# Patient Record
Sex: Female | Born: 1950 | Race: White | Marital: Married | Smoking: Never smoker
Health system: Northeastern US, Academic
[De-identification: ages and names within clinical notes are randomized; demographics above are authoritative.]

## PROBLEM LIST (undated history)

## (undated) DIAGNOSIS — E785 Hyperlipidemia, unspecified: Secondary | ICD-10-CM

## (undated) DIAGNOSIS — D0512 Intraductal carcinoma in situ of left breast: Secondary | ICD-10-CM

## (undated) DIAGNOSIS — C801 Malignant (primary) neoplasm, unspecified: Secondary | ICD-10-CM

## (undated) DIAGNOSIS — F32A Depression, unspecified: Secondary | ICD-10-CM

## (undated) DIAGNOSIS — I341 Nonrheumatic mitral (valve) prolapse: Secondary | ICD-10-CM

## (undated) DIAGNOSIS — F419 Anxiety disorder, unspecified: Secondary | ICD-10-CM

## (undated) DIAGNOSIS — M1990 Unspecified osteoarthritis, unspecified site: Secondary | ICD-10-CM

## (undated) DIAGNOSIS — E039 Hypothyroidism, unspecified: Secondary | ICD-10-CM

## (undated) DIAGNOSIS — K219 Gastro-esophageal reflux disease without esophagitis: Secondary | ICD-10-CM

## (undated) DIAGNOSIS — Z9109 Other allergy status, other than to drugs and biological substances: Secondary | ICD-10-CM

## (undated) HISTORY — DX: Anxiety disorder, unspecified: F41.9

## (undated) HISTORY — DX: Other allergy status, other than to drugs and biological substances: Z91.09

## (undated) HISTORY — PX: KNEE SURGERY: SHX244

## (undated) HISTORY — DX: Malignant (primary) neoplasm, unspecified: C80.1

## (undated) HISTORY — DX: Gastro-esophageal reflux disease without esophagitis: K21.9

## (undated) HISTORY — DX: Hyperlipidemia, unspecified: E78.5

## (undated) HISTORY — DX: Intraductal carcinoma in situ of left breast: D05.12

## (undated) HISTORY — DX: Unspecified osteoarthritis, unspecified site: M19.90

## (undated) HISTORY — DX: Hypothyroidism, unspecified: E03.9

## (undated) HISTORY — DX: Nonrheumatic mitral (valve) prolapse: I34.1

## (undated) HISTORY — PX: TONSILLECTOMY: SHX28A

## (undated) HISTORY — DX: Depression, unspecified: F32.A

---

## 2009-03-29 ENCOUNTER — Ambulatory Visit
Admit: 2009-03-29 | Discharge: 2009-03-29 | Disposition: A | Discharge status: Home / Self Care | Payer: Self-pay | Source: Ambulatory Visit | Attending: Internal Medicine | Admitting: Internal Medicine

## 2009-03-29 LAB — TSH: TSH: 2.34 u[IU]/mL (ref 0.27–4.20)

## 2009-03-31 LAB — VITAMIN D
25-OH VIT D2: <4 ng/mL
25-OH VIT D3: 32 ng/mL
25-OH Vit Total: 32 ng/mL (ref 30–80)

## 2010-03-01 ENCOUNTER — Ambulatory Visit
Admit: 2010-03-01 | Discharge: 2010-03-01 | Disposition: A | Discharge status: Home / Self Care | Payer: Self-pay | Source: Ambulatory Visit | Attending: Internal Medicine | Admitting: Internal Medicine

## 2010-03-01 LAB — LIPID PANEL
Chol/HDL Ratio: 2.3
Cholesterol: 207 mg/dL — AB
HDL: 91 mg/dL
LDL Calculated: 103 mg/dL
Non HDL Cholesterol: 116 mg/dL
Triglycerides: 66 mg/dL

## 2010-03-01 LAB — GLUCOSE: Glucose: 105 mg/dL (ref 74–106)

## 2010-03-01 LAB — TSH: TSH: 0.58 u[IU]/mL (ref 0.27–4.20)

## 2010-03-03 LAB — VITAMIN D
25-OH VIT D2: <4 ng/mL
25-OH VIT D3: 17 ng/mL
25-OH Vit Total: 17 ng/mL — ABNORMAL LOW (ref 30–80)

## 2011-10-02 ENCOUNTER — Ambulatory Visit: Payer: Self-pay | Admitting: Orthopedic Surgery

## 2011-10-02 ENCOUNTER — Encounter: Payer: Self-pay | Admitting: Orthopedic Surgery

## 2011-10-02 VITALS — BP 124/77 | Ht 62.0 in | Wt 164.0 lb

## 2011-10-02 DIAGNOSIS — R52 Pain, unspecified: Secondary | ICD-10-CM

## 2011-10-02 DIAGNOSIS — S83249A Other tear of medial meniscus, current injury, unspecified knee, initial encounter: Secondary | ICD-10-CM | POA: Insufficient documentation

## 2011-10-02 NOTE — Patient Instructions (Signed)
Dear [name],    Your physician has determined that you require durable medical equipment (DME) as a part of your treatment.  Knee braces, cast boots, walking boots, crutches, etc. Are DME.  These items offer protection and provide for your safety.  The type and quality of DME has been prescribed for you by your provider.      We cannot determine how much of the cost of this product will be paid by your insurance carrier.  Therefore, you may receive a bill for the outstanding balance.  It is your responsibility to pay whatever fee your insurance carrier does not.    DME Return Policy:     Braces and boots are not returnable if work outside of clinic due to hygiene concerns.   Poorly fitting braces can be exchanged for a correct fit within 1 week if the DME is in excellent condition.   DME that was not dispensed by [facility] will not be accepted.    If DME must be returned, it must be returned to the office that dispensed it.   Special order braces are billed at the time of order and are non-refundable.   Brace parts can be ordered and replaced if they become damaged or worn out.  This may include a charge.    Your type of brace: Other - XL hinged knee brace left    Patient Signature: __________________________  10/02/2011

## 2011-10-02 NOTE — Progress Notes (Signed)
Dictated

## 2011-10-03 NOTE — Progress Notes (Signed)
PATIENTKHAMARIA, [name]  MR #:  [id number]   ACCOUNT #:  [id number] DOB:  [birth year]   DICTATED BY:  Frances Nickels, PA DATE OF VISIT:  10/02/2011     CHIEF COMPLAINT:  Left knee pain.    HISTORY OF PRESENT ILLNESS:  The patient reports that she has been having pain for the better part of a year now.  She had left knee pain following an all-day gardening session and the pain was extreme.  It lasted for a couple of days and then subsided to some degree and she saw her primary care physician some time in October.  No x-rays were done and a diagnosis of bursitis was given.  She was placed on naproxen, which did not help.  She called back to the primary's office and asked for an x-ray. The x-ray was obtained and reveals mild medial joint line narrowing.  Mild patellofemoral narrowing as well. She says that the pain has persisted and it is getting increasingly difficult to walk.  She is unable to work out very effectively.  She says there is a lot of throbbing and aching when she is laying in bed at night. Daily activities very limited.  She is currently taking naproxen.  She had x-rays at [facility] as previously noted.    PAST MEDICAL HISTORY:  Significant for mitral valve prolapse since 1983.  Mild colitis, thyroid problem, occasional depression.  History of her wrist fracture as a child.  Past surgeries tonsillectomy.    ALLERGIES:  None.    SOCIAL HISTORY:  She is married.  Nonsmoker.  Drinks alcohol.  Denies recreational drug use.    FAMILY HISTORY:  Mother deceased Alzheimer's.  Father deceased, lung cancer.  Nine siblings, 1 deceased from emphysema.  Three children in good health.    REVIEW OF SYSTEMS:  Colitis and depression due to pain and increased weight.    PHYSICAL EXAM:  Exam today reveals a pleasant and alert 61 year old female in no acute distress.  Exam of her left knee reveals a small effusion, no warmth or redness.  No breaks in the skin.  Range of motion 0-120 with pain at the end of flexion,  nontender laterally.  Significantly tender along the medial joint line.  Flexion and rotational stress is very painful medially.  Negative anterior and posterior drawer and Lachman.  No increase in discomfort or laxity with varus stress but valgus stress aggravates the medial side of her knee considerably.  There is no appreciable laxity.    ASSESSMENT:  Given the duration of symptoms and the exam today, I think she has a medial meniscus tear.  Accordingly, we will send her for an MRI, to get that scheduled soon and she will follow up with Dr. Janyth Contes when that is available to determine further treatment.  She is in so much discomfort that I did offer a corticosteroid injection.  She is willing to proceed, and under sterile precautions, I injected the left knee with 80 mg Depo-Medrol, 6 cc 1% Xylocaine.  She tolerated the procedure well, was given post-injection instructions.  She will return to the office following the MRI.       Dictated By:  Frances Nickels, PA      ______________________________  [name], MD    LJS/MODL  DD:  10/02/2011 15:57:25  DT:  10/03/2011 07:37:30  Job #:  [id number]    cc: Grant Fontana, MD   [address]  [place], [place] [address]

## 2011-10-09 ENCOUNTER — Ambulatory Visit: Payer: Self-pay | Admitting: Sports Medicine

## 2011-10-09 ENCOUNTER — Encounter: Payer: Self-pay | Admitting: Sports Medicine

## 2011-10-09 VITALS — BP 120/72 | Ht 62.0 in | Wt 164.0 lb

## 2011-10-09 DIAGNOSIS — S83249A Other tear of medial meniscus, current injury, unspecified knee, initial encounter: Secondary | ICD-10-CM

## 2011-10-09 NOTE — Progress Notes (Signed)
Dictated

## 2011-10-10 NOTE — Progress Notes (Signed)
PATIENTSONDREA, BREN  MR #:  [id number]   ACCOUNT #:  [id number] DOB:  01-13-1951   DICTATED BY:  [name], MD DATE OF VISIT:  10/09/2011     Ahliyah returns for reexamination of left knee and review of MRI findings.  Persistent medial based knee pain.  Anti-inflammatories are not helpful.  Pain persistent primarily with difficulty in walking and sleeping at night as well as activities of daily living, particularly with pivoting.       Past medical history, medications and allergies are as documented in the office chart and marked as reviewed.    SOCIAL HISTORY:  She is married.  Nonsmoker.  Drinks alcohol occasionally. 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    PHYSICAL EXAMINATION:  Focused exam of the left knee demonstrates pain on range of motion including 0-120 with a medial based knee pain.  Pain is worse on deep flexion and circumduction.    IMAGING:  MRI demonstrates complex tear, medial meniscus. Moderate patellofemoral arthritis.    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  Her pain is medial based and this is the location of her meniscus tear.  Although she does have arthritic changes, her pain is not associated with patellofemoral chondromalacia on exam today.  I believe at this point she has demonstrated failure of conservative management.  We have discussed the option of arthroscopic partial meniscectomy and chondroplasty and the expected time course for recovery.  She is interested in pursuing and agrees.  She has failed conservative management. The plan is to have her phone the office to schedule and I will see her back at the time of surgery.             ______________________________  [name], MD    JPG/MODL  DD:  10/09/2011 18:44:39  DT:  10/10/2011 01:48:31  Job #:  [id number]    cc: Grant Fontana, MD   [address]   [place], [place] [address]

## 2011-10-18 ENCOUNTER — Encounter: Payer: Self-pay | Admitting: Sports Medicine

## 2011-10-18 NOTE — OR PreOp (Signed)
[facility] Pre-operative Guidelines    Day of surgery arrival Time:  The surgery center will call you the business day before your procedure between 3 and 4:30pm.     Eating and drinking guidelines:  No solid foods after midnight the night before your procedure.  Adults-  May have clear liquids (water, soda or apple juice only) up to 4 hours prior to your arrival time and then nothing by mouth.  Pediatrics-  All but dental patients may have clear liquids (water, soda, or apple juice only) up to 3 hours before arrival time.   Dental patients are NPO after midnight.  Breast fed infants may have breast milk up to 4 hours before arrival.  Formula fed infants may have formula up to 6 hours before arrival.    Medications:  Medications to be taken on the day of surgery:   synthroid    Items to bring on the day of surgery:  Insurance card, photo ID and healthcare proxy.  If you use a CPAP for sleep apnea, please bring your mask with you.  Your crutches, knee brace or arm sling. Please leave your crutches in the car.    Clothing, Jewelry, valuables and eyeglass case:  Wear loose fitting clothing that will fit over a bulky dressing.  Leave all jewelry and valuables at home.  Bring your eyeglasses and eyeglass case. You cannot wear contact lenses.     Pediatrics:  A parent or legal guardian must accompany and remain in the building at all times. We recommend that two adults accompany the patient home while riding in a car; one to drive the vehicle and one to assist with the care of the child.   Bring the child’s favorite comfort item (i.e. blanket, doll or toy).    Surgeon Instructions:  Please read all pre-operative instructions carefully, and follow your surgeons guidelines if different from these instructions.

## 2011-11-02 NOTE — Anesthesia Pre-procedure Eval (Signed)
Anesthesia Pre-operative Evaluation for [name]    61 yo female for left knee arthroscopy     Health History  Past Medical History   Diagnosis Date   . Mitral valve prolapse    . Thyroid disease      Past Surgical History   Procedure Laterality Date   . Tonsillectomy       Social History  History   Substance Use Topics   . Smoking status: Former Games developer   . Smokeless tobacco: Not on file   . Alcohol Use: 4.2 oz/week     7 Glasses of wine per week      History   Drug Use No     ______________________________________________________________________  Allergies: No Known Allergies (drug, envir, food or latex)  Prior to Admission Medications              Last Dose Start Date End Date Provider     levothyroxine (SYNTHROID, LEVOTHROID) 100 MCG tablet 11/05/2011 at 0730  --  --  [provider]     naproxen (NAPROSYN) 500 MG tablet Past Month at Unknown  --  --  [provider]        Current Outpatient Prescriptions   Medication Sig   . levothyroxine (SYNTHROID, LEVOTHROID) 100 MCG tablet Take 100 mcg by mouth daily (before breakfast)   . naproxen (NAPROSYN) 500 MG tablet Take 500 mg by mouth 2 times daily (with meals)     Current Facility-Administered Medications   Medication   . lidocaine 1 % injection 0.1 mL   . Lactated Ringers Infusion   . ceFAZolin (ANCEF) injection 1,000 mg     Admission Medications:  Scheduled Meds       . cefazolin IV     IV Meds   PRN Meds       . lidocaine 0.1 mL at 11/05/11 1032     Anesthesia EvaluationInformation Source: per records, per patient, per family  General     Denies general issues    + Obesity  Pertinent (-):  history of anesthetic complications or FamHx of anesthetic complications    HEENT  Denies HEENT issues    + Corrective Eyewear            glasses    Pulmonary   Denies pulmonary issues    + Smoker            former  Pertinent (-):  asthma, recent URI or sleep apnea Cardiovascular  Denies cardiovascular issues  Good(4+METs) Exercise Tolerance  Pertinent  (-):  hypertension or DOE    GI/Hepatic/Renal     Denies GI/hepatic/renal issues  Last PO Intake: >8hr before procedure      + Alcohol use  Pertinent (-):  GERD Neuro/Psych    Denies neuro/psych issues    Endo/Other    Denies endo issues    +Thyroid Disease          HYPOthyroid    Pertinent (-):  diabetes mellitus    Hematologic  Denies hematologic issues     Nursing Reported PO Status: Date Last PO Fluids: 11/04/11 2300  Date Last PO Solids: 11/04/11 2300  ______________________________________________________________________  Physical Exam    Airway            Mouth opening: normal            TM distance (fb): >3 FB            Neck ROM: full  Dental   Normal Exam  Cardiovascular  Normal Exam           Rhythm: regular           Rate: normal  No murmur     Pulmonary   pulmonary exam normal    breath sounds clear to auscultation    Mental Status   Normal  evaluation    oriented to person, place and time         Most Recent Vitals: BP: 124/81 mmHg (11/05/11 1023)  Heart Rate: 81 (11/05/11 1023)  Temp: 36.4 C (97.5 F) (11/05/11 1023)  Resp: 18 (11/05/11 1023)  Height: 157.5 cm (5\' 2" ) (10/18/11 1130)  weight: 75.751 kg (167 lb) (11/05/11 1023)  BMI (Calculated): 30.6 (11/05/11 1023)  SpO2: 98 % (11/05/11 1023)    Vital Sign Ranges (last 24hrs)  Temp:  [36.4 C (97.5 F)] 36.4 C (97.5 F)  Heart Rate:  [81] 81  Resp:  [18] 18  BP: (124)/(81) 124/81 mmHg   O2 Device: None (Room air) (11/05/11 1023)    Most Recent Lab Results   Blood Type  No results found for this basename: aborh,  abs   CBC  No results found for this basename: WBC,  WBCM,  HCT,  PLT   Chem-7  Lab Results   Component Value Date    GLU 105 03/01/2010   CrCl is unknown because no creatinine reading has been taken.  Electrolytes  No results found for this basename: CA,  MG,  PO4   Coags  No results found for this basename: PTI,  INR,  PTT   LFTs  No results found for this basename: AST,  ALT,  ALK      Pregnancy Test:   No results found for this  basename: PUPT,  UPREG,  SPREG,  HCG1,  BHCG2,  BHCG,  HCGB     UPT:  No results found for this basename: PUPT, UPREG, SPREG, HCG1       LMP:  No LMP recorded. Patient is postmenopausal.    ECG Results  No results found for this basename: rate,  PR,  statement     ANES CPM    Radiology: No relevant studies     ________________________________________________________________________  Medical Problems  Patient Active Problem List    Diagnosis Date Noted   . Medial meniscus tear 10/02/2011       PreOp/PreProcedure Diagnosis (For more detail see procedural consent)            Left knee pain  Planned Procedure (For more detail see procedural consent)            Left knee arthroscopy  Plan   ASA Score 2  Anesthetic Plan (general); Induction (routine IV); Airway (LMA); Line ( use current access); Monitoring (standard ASA); Positioning (supine); Pain (per surgical team and intraop local); PostOp (PACU)    Informed Consent     Risks:          Risks discussed were commensurate with the plan listed above with the following specific points:  N/V, sore throat , damage to:(eyes, nerves, teeth), allergic Rx, unexpected serious injury, death    Anesthetic Consent:         Anesthetic plan and risks discussed with:  patient and spouse    Plan discussed with:  resident      Attending Attestation: The patient or proxy understand and accept the risks and benefits of the anesthesia plan. By accepting this note, I attest that I have personally performed the  history and physical exam and prescribed the anesthetic plan within 48 hours prior to the anesthetic as documented by me above.    Author: Briscoe Deutscher, MD

## 2011-11-05 ENCOUNTER — Ambulatory Visit
Admit: 2011-11-05 | Discharge: 2011-11-05 | Disposition: A | Discharge status: Home / Self Care | Payer: Self-pay | Source: Ambulatory Visit | Attending: Sports Medicine | Admitting: Sports Medicine

## 2011-11-05 MED ORDER — LIDOCAINE HCL 1 % IJ SOLN
0.1000 mL | Freq: Once | INTRAMUSCULAR | Status: DC | PRN
Start: 2011-11-05 — End: 2011-11-06
  Administered 2011-11-05: 0.1 mL via SUBCUTANEOUS

## 2011-11-05 MED ORDER — ONDANSETRON HCL 2 MG/ML IV SOLN *I*
1.0000 mg | Freq: Once | INTRAMUSCULAR | Status: AC | PRN
Start: 2011-11-05 — End: 2011-11-05

## 2011-11-05 MED ORDER — LIDOCAINE HCL 1 % IJ SOLN
INTRAMUSCULAR | Status: AC
Start: 2011-11-05 — End: 2011-11-05
  Filled 2011-11-05: qty 2

## 2011-11-05 MED ORDER — CEFAZOLIN SODIUM 1000 MG IJ SOLR *I*
1000.0000 mg | INTRAMUSCULAR | Status: DC
Start: 2011-11-05 — End: 2011-11-06

## 2011-11-05 MED ORDER — HYDROCODONE-ACETAMINOPHEN 5-325 MG PO TABS *I*
1.0000 | ORAL_TABLET | ORAL | Status: DC | PRN
Start: 2011-11-05 — End: 2017-02-15

## 2011-11-05 MED ORDER — PROMETHAZINE HCL 25 MG/ML IJ SOLN *I*
6.2500 mg | INTRAMUSCULAR | Status: DC | PRN
Start: 2011-11-05 — End: 2011-11-06

## 2011-11-05 MED ORDER — OXYCODONE-ACETAMINOPHEN 5-325 MG/5ML PO SOLN *A*
5.0000 mL | Freq: Once | ORAL | Status: DC | PRN
Start: 2011-11-05 — End: 2011-11-06
  Administered 2011-11-05: 5 mL via ORAL

## 2011-11-05 MED ORDER — OXYCODONE-ACETAMINOPHEN 5-325 MG/5ML PO SOLN *A*
ORAL | Status: AC
Start: 2011-11-05 — End: 2011-11-05
  Filled 2011-11-05: qty 5

## 2011-11-05 MED ORDER — CEFAZOLIN SODIUM 1000 MG IJ SOLR *I*
INTRAMUSCULAR | Status: AC
Start: 2011-11-05 — End: 2011-11-05
  Filled 2011-11-05: qty 10

## 2011-11-05 MED ORDER — ASPIRIN 325 MG PO TBEC
325.0000 mg | DELAYED_RELEASE_TABLET | Freq: Every day | ORAL | Status: DC
Start: 2011-11-05 — End: 2017-02-28

## 2011-11-05 MED ORDER — HALOPERIDOL LACTATE 5 MG/ML IJ SOLN *I*
1.0000 mg | Freq: Once | INTRAMUSCULAR | Status: AC | PRN
Start: 2011-11-05 — End: 2011-11-05

## 2011-11-05 MED ORDER — HYDROMORPHONE HCL PF 1 MG/ML IJ SOLN *WRAPPED*
0.5000 mg | INTRAMUSCULAR | Status: DC | PRN
Start: 2011-11-05 — End: 2011-11-06

## 2011-11-05 MED ORDER — FENTANYL CITRATE 50 MCG/ML IJ SOLN *WRAPPED*
INTRAMUSCULAR | Status: AC
Start: 2011-11-05 — End: 2011-11-05
  Filled 2011-11-05: qty 2

## 2011-11-05 MED ORDER — MIDAZOLAM HCL 1 MG/ML IJ SOLN *I* WRAPPED
INTRAMUSCULAR | Status: AC
Start: 2011-11-05 — End: 2011-11-05
  Filled 2011-11-05: qty 2

## 2011-11-05 MED ORDER — LACTATED RINGERS IV SOLN *I*
20.0000 mL/h | INTRAVENOUS | Status: DC
Start: 2011-11-05 — End: 2011-11-06
  Administered 2011-11-05: 20 mL/h via INTRAVENOUS

## 2011-11-05 NOTE — Progress Notes (Signed)
Report given to [employer] [facility].

## 2011-11-05 NOTE — Discharge Instructions (Signed)
Due to the sedation medication and or general anesthesia you have been given today, please follow these discharge instructions:    [x]  Do not drive or operate any machinery for 24 hours or the specified time frame that was recommended by your doctor, please refer to post op instructions.  [x]  Do not drink any alcoholic beverages for 24 hours after your procedure and/or if you are taking a narcotic pain reliever (e.g. Vicodin, Percocet or Tylenol #3).  [x]  Do not make any major decisions or sign contracts for 24 hours.  [x]  Prescription information given to patient and/or patient representative.    Diet: begin with liquids, advance as tolerated.    Your last pain medication was given to you at: TORADOL (like ibubrofen) AT 1215 PM    Your next dose of pain medication is due after: IBUPROFEN AT 6:15 Pm, or NORCO as PRESCRIBED. Do not take tylenol while taking Norco.    If unable to reach your doctor at [phone], call 911 for a true emergency or go to your nearest emergency department.    It is our recommendation that you have someone stay with you for 24 hours following your procedure.

## 2011-11-05 NOTE — Preop H&P (Signed)
UPDATES TO PATIENT'S CONDITION on the DAY OF SURGERY/PROCEDURE    I. Updates to Patient's Condition (to be completed by a provider privileged to complete a H&P, following reassessment of the patient by the provider):    Day of Surgery/Procedure Update:  History  History reviewed and no change    Physical  Physical exam updated and no change.  Lungs clear and cor reg s1/s2              II. Procedure Readiness   I have reviewed the patient's H&P and updated condition. By completing and signing this form, I attest that this patient is ready for surgery/procedure.      III. Attestation   I have reviewed the updated information regarding the patient's condition and it is appropriate to proceed with the planned surgery/procedure.    [name], MD as of 10:16 AM 11/05/2011

## 2011-11-05 NOTE — Anesthesia Post-procedure Eval (Signed)
Anesthesia Post-op Note    Anesthesia Post-op Note    Patient: [name]    Procedure(s) Performed: s/p knee arthroscopy    Anesthesia type: General    Patient location: PACU    Mental Status: Recovered to baseline    Patient able to participate in this evaluation: yes    Last Vitals: BP 109/64  Pulse 68  Temp(Src) 36.1 C (97 F) (Temporal)  Resp 16  Ht 1.575 m (5\' 2" )  Wt 75.751 kg (167 lb)  BMI 30.54 kg/m2  SpO2 100%    Post-op vital signs: stable    Post-op vital signs noted above are within patient's normal range    Respiratory function: baseline    Airway patent: Yes    Cardiovascular and hydration status stable: Yes    Post-Op pain: Adequate analgesia    Post-Op assessment: no apparent anesthetic complications, tolerated procedure well and no evidence of recall    Complications: none    Attending Attestation: All indicated post anesthesia care provided    Author: Briscoe Deutscher, MD  as of: 11/05/2011  at: 1:49 PM

## 2011-11-06 MED FILL — Bupivacaine HCl Preservative Free (PF) Inj 0.25%: INTRAMUSCULAR | Qty: 30 | Status: AC

## 2011-11-06 MED FILL — Epinephrine HCl Inj 1 MG/ML: INTRAMUSCULAR | Qty: 1 | Status: AC

## 2011-11-06 MED FILL — Lidocaine Inj 1% w/ Epinephrine-1:100000: INTRAMUSCULAR | Qty: 50 | Status: AC

## 2011-11-06 NOTE — Op Note (Signed)
PATIENTROYETTA, [name]  MR #:  [id number]   ACCOUNT #:  [id number] DOB:  02/22/1950    AGE:  61     SURGEON:  Priscilla Schneiders, MD  CO-SURGEON:    ASSISTANT:    SURGERY DATE:  11/05/2011    PREOPERATIVE DIAGNOSIS:    1. Left knee medial meniscus tear.  2. Left knee lateral meniscus tear   3. Left knee chondromalacia.    POSTOPERATIVE DIAGNOSIS:    1. Left knee medial meniscus tear.  2. Left knee lateral meniscus tear   3. Left knee chondromalacia.    OPERATIVE PROCEDURE:    1. Arthroscopic left knee partial medial and lateral meniscectomy.  2. Arthroscopic left knee chondroplasty.    COMPLICATIONS:  None.    ANESTHESIA:  General.    INDICATIONS:  A 61 year old individual with a primarily medial based knee pain.  Not responding to conservative management.  Elects for surgical treatment for complex medial meniscus.    FINDINGS:  Complex tear medial and lateral meniscus treated with partial medial and lateral meniscectomy.  Patellofemoral and medial compartment arthritic changes treated with a chondroplasty.    DESCRIPTION OF PROCEDURE:  The patient was taken to the operating room and placed in supine position on the operating room table.  After adequate induction of general anesthesia, the left lower extremity was prepped and draped in usual sterile fashion.  Standard medial and lateral parapatellar joint line portals were then established with an 11 blade scalpel and diagnostic arthroscopy was initiated.     Patellofemoral articulation demonstrated grade 3 fibrillation diffusely over the patella central articular surface and this extended into the lateral facet.  The unstable fibrillated tissue was treated with a chondroplasty.  The unstable tissue was resected and a smooth transition was created.       The femoral trochlea was in good condition.  The anterior cruciate and posterior cruciate ligaments were intact and stable.       The medial compartment demonstrated a complex tear pattern of the medial meniscus.   This included a horizontal cleavage tear in the anterior 1/3 extending into the midbody.  There was a deep radial tear in the midbody, and a large parrot-beak shaped displaceable segment of meniscus was entrapped under the midbody.  This was an unstable tear pattern, not amenable to repair and was therefore treated with a partial medial meniscectomy.  Using biter and shaver instruments, the unstable meniscus tissue was resected and a smooth transition was created.       The medial femoral condyle demonstrated grade 3 fibrillation and delamination over the majority of the weightbearing surface of the femoral condyle in the flexion arc of 30-120 degrees in an area 3 x 2 cm.  The unstable fibrillated tissue was therefore treated with a chondroplasty using a full radius shaver.  The unstable tissue was resected and a smooth transition was created.     The lateral compartment demonstrated a complex tear pattern of the lateral meniscus.  This included several radial tears in the midbody and a small horizontal cleavage tear in the posterior 1/3.  Again this was an unstable tear pattern, not amenable to repair and was therefore treated with a partial lateral meniscectomy.  Using biter and shaver instruments, the unstable meniscus tissue was resected and a smooth transition was created.       The articular surface in the lateral compartment was without arthritic change and this therefore,the case.  The instrumentation was removed.  The  portals were closed using 3-0 Monocryl subcuticular stitches.  The skin was then cleaned free of Betadine prep and a sterile dressing consisting of Xeroform gauze, dry gauze, and sterile Webril was applied.  The drapes were then broken down, and an Ace wrap was used to secure the dressing in position.  The patient was then allowed to awaken from general anesthesia and was extubated without difficulty.  She was then transferred to her preoperative stretcher for transportation to the  postanesthesia care unit in good condition.             ______________________________  Priscilla Schneiders, MD    JPG/MODL  DD:  11/05/2011 16:38:17  DT:  11/06/2011 08:08:36  Job #:  [id number]

## 2011-11-20 ENCOUNTER — Encounter: Payer: Self-pay | Admitting: Sports Medicine

## 2011-11-20 ENCOUNTER — Ambulatory Visit: Payer: Self-pay | Admitting: Sports Medicine

## 2011-11-20 VITALS — BP 118/70 | Ht 62.0 in | Wt 164.0 lb

## 2011-11-20 DIAGNOSIS — M9420 Chondromalacia, unspecified site: Secondary | ICD-10-CM | POA: Insufficient documentation

## 2011-11-20 NOTE — Progress Notes (Signed)
Dictated

## 2011-11-21 NOTE — Progress Notes (Signed)
PATIENTJENNIKA, ROSENSTIEL  MR #:  [id number]   ACCOUNT #:  [id number] DOB:  01-12-1951   DICTATED BY:  Frances Nickels, PA DATE OF VISIT:  11/20/2011     CHIEF COMPLAINT:  Followup for left knee.    INTERVAL HISTORY:  The patient underwent surgery on 11/05/2011, left knee arthroscopic partial medial and lateral meniscectomies and chondroplasty.  She tells me she is doing very well.  She is doing some walking.  She has already returned to work and she is anxious to get back to bicycling and elliptical.  She also enjoys power walking.    PHYSICAL EXAMINATION:  Exam of the left knee today reveals a small effusion, no warmth, redness or breaks in the skin.  Incision is healing nicely.  Tenderness primarily along the medial joint line.  Range of motion 0-120 today.    ASSESSMENT:  She is doing well postop.  I reviewed with her the intraoperative photos and discussed the implications for possible future difficulty.  I have advised against running, pounding and jumping activities; however walking may or may not be tolerated.  She will just have to try it and decide.  Elliptical, bike and swimming would be fine.  She was also offered the opportunity to go to physical therapy for strengthening of the quads, but she chose to do her own self-directed therapy.  She may return for an occasional corticosteroid injection if needed.  We will see her back on a p.r.n. basis.       Dictated By:  Frances Nickels, PA      ______________________________  [name], MD    LJS/MODL  DD:  11/21/2011 08:13:41  DT:  11/21/2011 09:47:25  Job #:  [id number]    cc: Grant Fontana, MD   [address]   [place], [place] [address]

## 2011-12-04 ENCOUNTER — Ambulatory Visit: Payer: Self-pay | Admitting: Sports Medicine

## 2012-02-13 HISTORY — PX: KNEE CARTILAGE SURGERY: SHX688

## 2012-12-22 ENCOUNTER — Ambulatory Visit
Admit: 2012-12-22 | Discharge: 2012-12-22 | Disposition: A | Discharge status: Home / Self Care | Payer: Self-pay | Source: Ambulatory Visit | Attending: Gastroenterology | Admitting: Gastroenterology

## 2012-12-23 LAB — SURGICAL PATHOLOGY

## 2013-02-17 ENCOUNTER — Ambulatory Visit
Admit: 2013-02-17 | Discharge: 2013-02-17 | Disposition: A | Discharge status: Home / Self Care | Payer: Self-pay | Source: Ambulatory Visit | Attending: Internal Medicine | Admitting: Internal Medicine

## 2013-02-17 LAB — TSH: TSH: 1.04 u[IU]/mL (ref 0.27–4.20)

## 2014-01-26 ENCOUNTER — Other Ambulatory Visit: Payer: Self-pay

## 2014-01-26 LAB — CBC
Hematocrit: 40.8 %^% (ref 34.1–44.9)
Hemoglobin: 13.7 g/dL^g/dL (ref 11.2–15.7)
MCH: 32.2 pg^pg (ref 25.6–32.2)
MCHC: 33.6 g/dL^g/dL (ref 32.2–35.5)
MCV: 95.8 fL^fL — ABNORMAL HIGH (ref 79.4–94.8)
Nucl RBC %: 0 /100 WBC^/100 WBC (ref 0.0–0.2)
Platelets: 295 10*3/uL (ref 182–369)
RBC Distribution Width-SD: 45.3 fL^fL (ref 36.4–46.3)
RBC: 4.26 10*6/uL (ref 3.93–5.22)
RDW: 12.9 %^% (ref 11.7–14.4)
WBC: 5.8 10*3/uL (ref 4.0–10.0)

## 2014-01-26 LAB — LIPID PANEL
Chol/HDL Ratio: 2.4 (ref 1.0–3.5)
Cholesterol: 210 mg/dL^mg/dL — ABNORMAL HIGH (ref 0–199)
HDL: 87 mg/dL^mg/dL — ABNORMAL HIGH (ref 50–60)
LDL Calculated: 111 mg/dL^mg/dL (ref 0–129)
Triglycerides: 62 mg/dL^mg/dL (ref 0–149)

## 2014-01-26 LAB — TSH: TSH: 2.25 uIU/mL^uIU/mL (ref 0.36–3.74)

## 2014-02-25 ENCOUNTER — Other Ambulatory Visit: Payer: Self-pay

## 2014-02-25 LAB — COMPREHENSIVE METABOLIC PANEL
ALT: 31 U/L^U/L (ref 12–78)
AST: 19 U/L^U/L (ref 11–37)
Albumin: 3.9 g/dL^g/dL (ref 3.4–5.0)
Alk Phos: 74 U/L^U/L (ref 45–117)
Anion Gap: 6 (ref 3–11)
Bilirubin,Total: 0.5 mg/dL^mg/dL (ref 0.2–1.0)
CO2: 30 meq/L^meq/L (ref 21–32)
Calcium: 8.6 mg/dL^mg/dL (ref 8.5–10.1)
Chloride: 104 meq/L^meq/L (ref 98–107)
Creatinine: 0.8 mg/dL^mg/dL (ref 0.6–1.3)
GFR,Black: 88 mL/min/{1.73_m2}
GFR,Caucasian: 72 mL/min/{1.73_m2}
Glucose: 105 mg/dL^mg/dL — ABNORMAL HIGH (ref 70–100)
Lab: 18 mg/dL^mg/dL (ref 7–18)
Potassium: 3.9 meq/L^meq/L (ref 3.5–5.1)
Sodium: 140 meq/L^meq/L (ref 136–145)
Total Protein: 7.3 g/dL^g/dL (ref 6.4–8.2)

## 2015-10-28 IMAGING — MG 3D SCREENING DIGITAL MAMMOGRAM WITH CAD
4 series · 5 of 23 positions shown · non-contrast
Comparison: 08/26/2015 and 01/15/2014.

------------- REPORT [id number] -------------
Exam:  

Annual screening digital mammogram with 3D Tomosynthesis with CAD
INDICATION: Screening.

[Series 8698: R CC · right · 0.10mm/px · 2 of 2 slices shown]
[im 1/2]
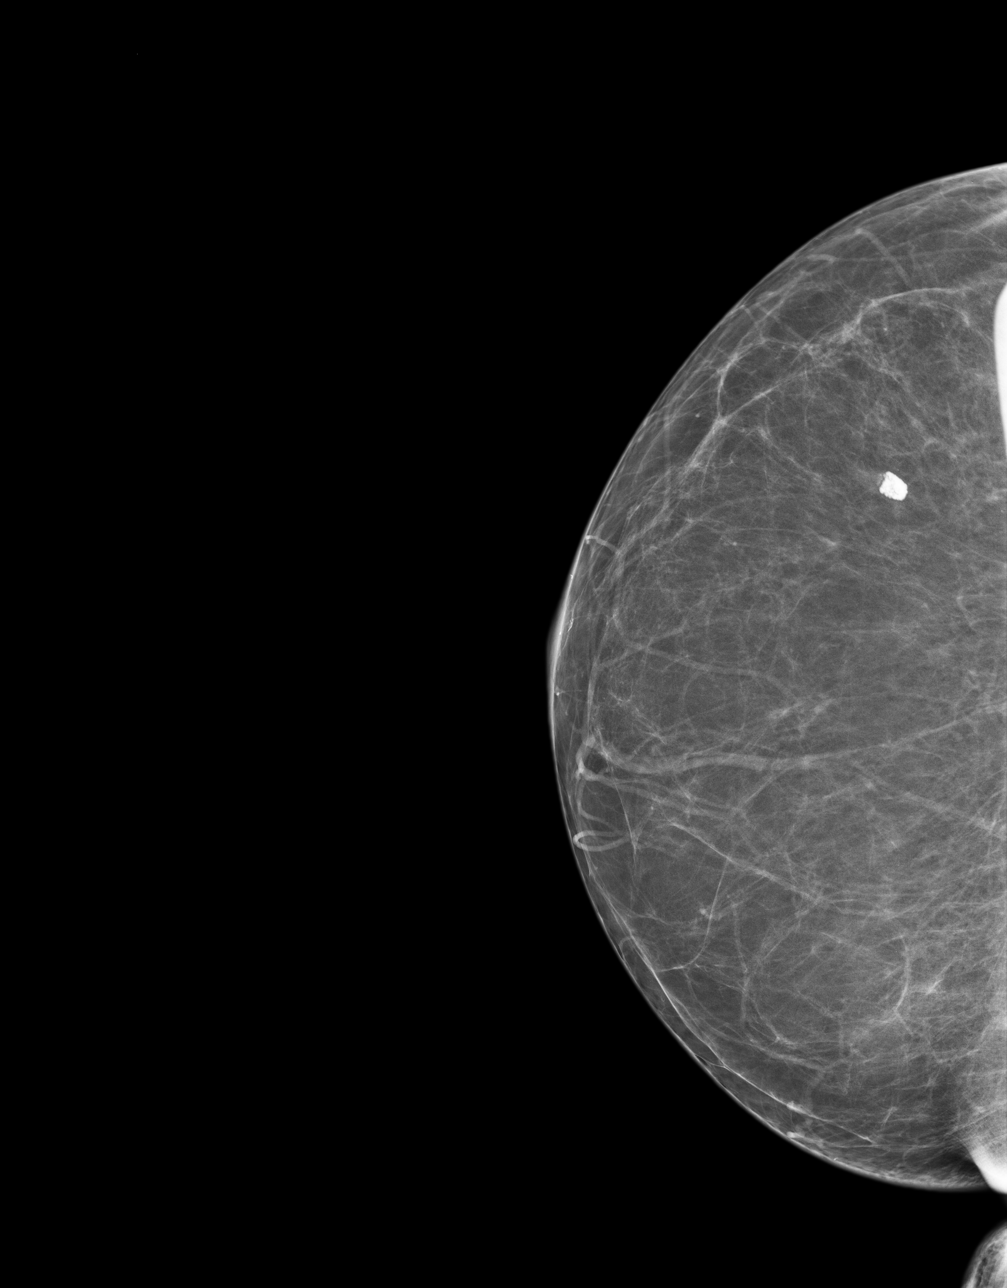
[im 2/2]
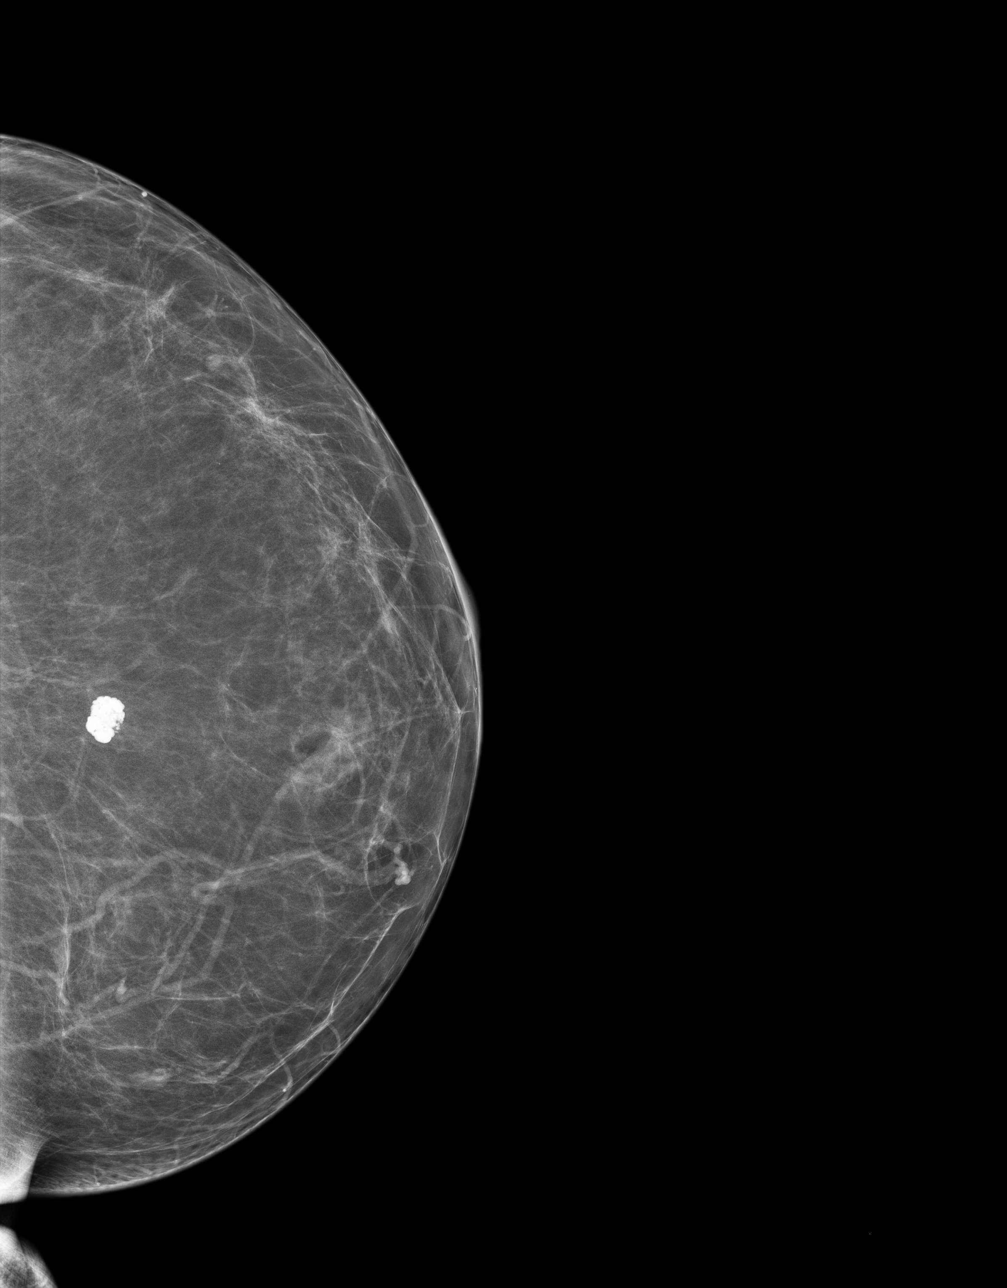

[3D SCREENING DIGITAL MAMMOGRAM WITH CAD (1 of 2) · tomo slice 13/83.0]
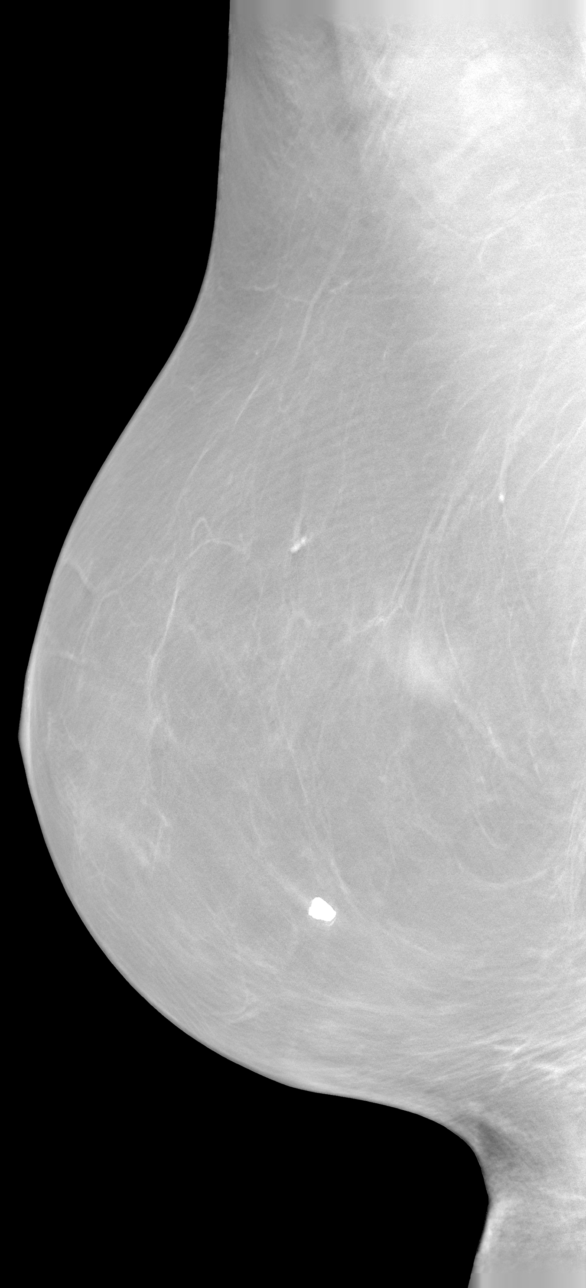

[3D SCREENING DIGITAL MAMMOGRAM WITH CAD (2 of 2) · tomo slice 13/84.0]
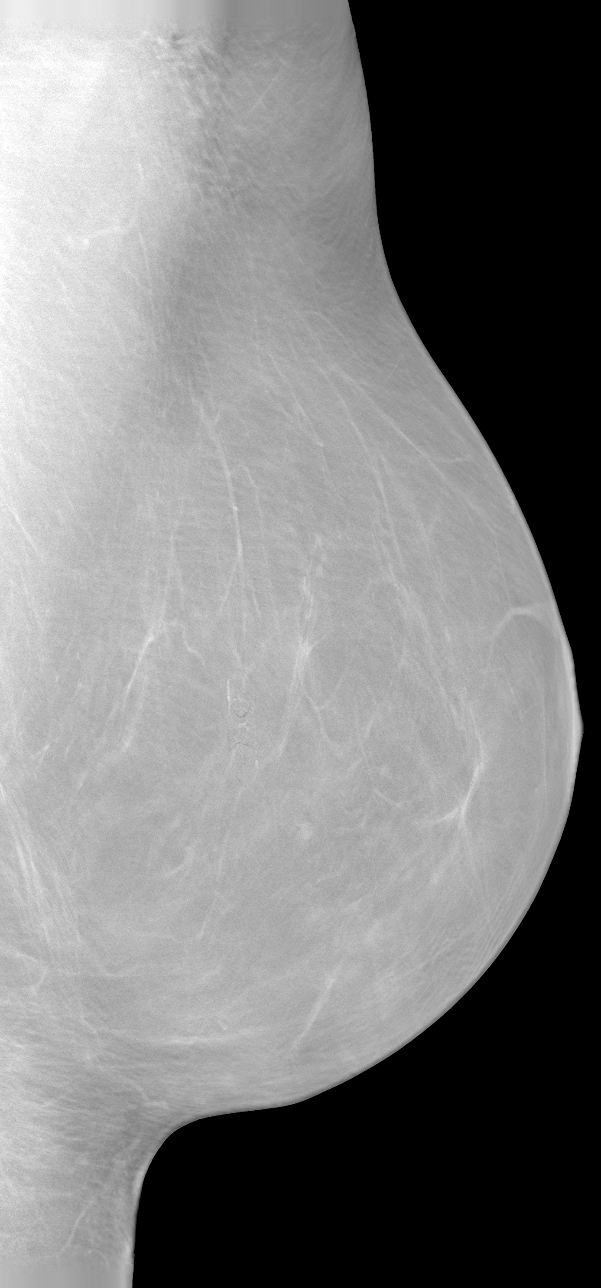

[L]
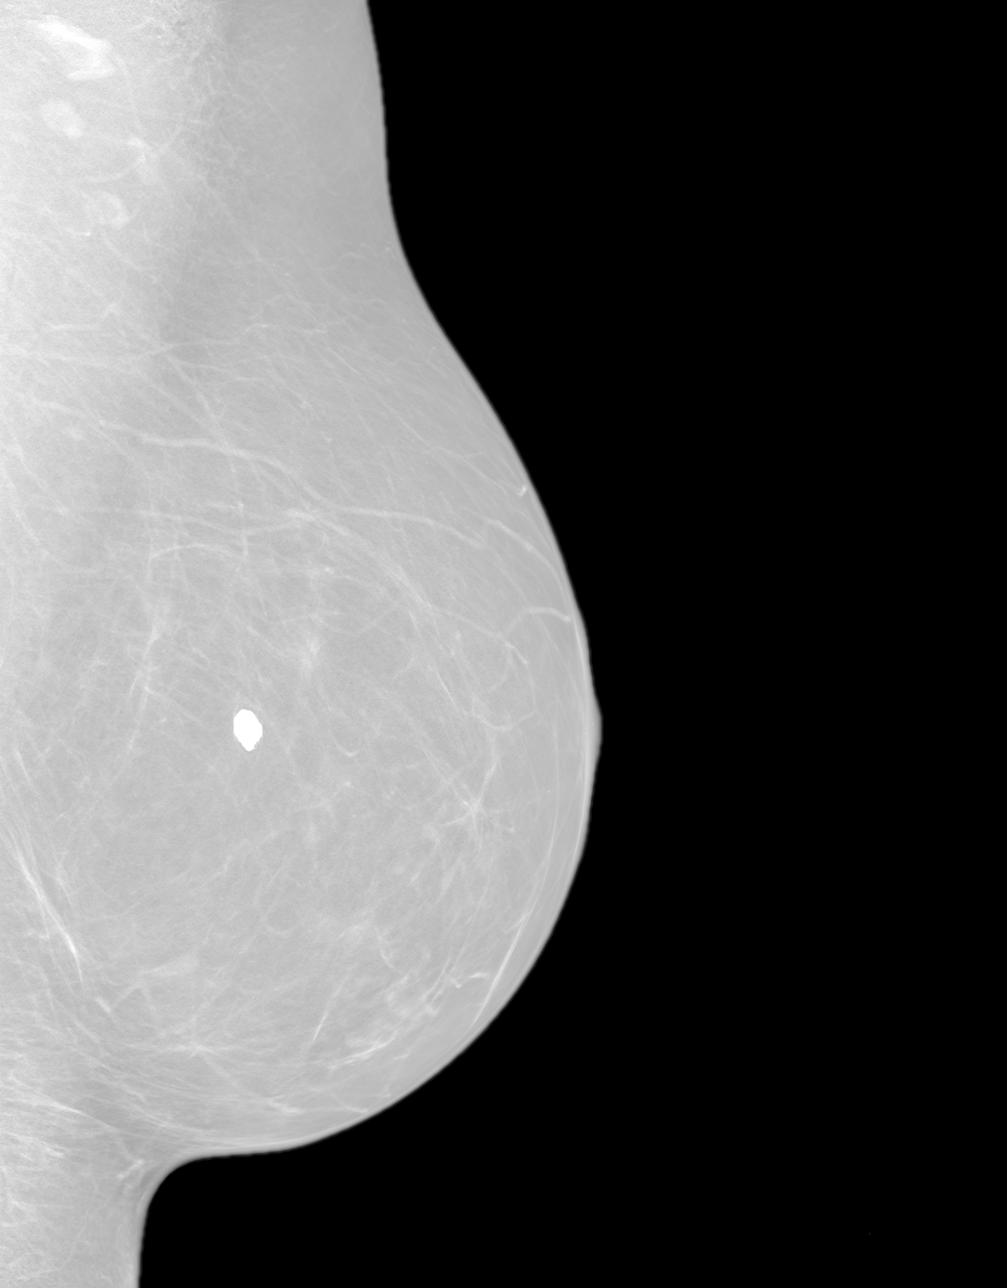

[5 of 23 positions shown; findings below may reference images not displayed]

FINDINGS: There are scattered fibroglandular elements. There is no mass or suspicious cluster of microcalcifications. There is no architectural distortion, skin thickening or nipple retraction.
IMPRESSION: BIRADS 2-Benign findings. Patient has been added in a reminder system with a

target date for the next screening mammography.

DENSITY CODE –  B (Scattered areas of fibroglandular density). 

Final Assessment Code:

Bi-Rads 2 

BI-RADS 0
Need additional imaging evaluation

BI-RADS 1
Negative mammogram

BI-RADS 2
Benign finding

BI-RADS 3
Probably benign finding: short-interval follow-up suggested

BI-RADS 4
Suspicious abnormality:  biopsy should be considered

BI-RADS 5
Highly suggestive of malignancy; appropriate action should be taken

BI-RADS 6
Known Biopsy-proven Malignancy – Appropriate action should be taken

NOTE:
In compliance with Federal regulations, the results of this mammogram are being sent to the patient.

------------- REPORT [id number] -------------
[facility] [place]
[address]
We wish to report the following on your recent mammography examination. We are sending a report to your referring physician or other health care provider. 
(       Normal/Negative:
No evidence of cancer.
This statement is mandated by the [place], Department of Health.
Your examination was performed by one of our technologists, who are registered radiological technologists and also specially certified in mammography:
___
Frank, Zarina (M)
___
Huidrom, Rutik (M)

Your mammogram was interpreted by our radiologist.

( 
Jean Rousso Aniece, M.D.

(Annual Breast Examination by a physician or other health care provider
(Annual Mammography Screening beginning at age 40
(Monthly Breast Self Examination

## 2015-11-08 ENCOUNTER — Other Ambulatory Visit: Payer: Self-pay

## 2015-11-08 LAB — BASIC METABOLIC PANEL
Anion Gap: 13 ^^L (ref 7–16)
CO2: 25 mmol/L (ref 20–28)
Calcium: 9.4 mg/dL (ref 8.6–10.2)
Chloride: 101 mmol/L (ref 96–108)
Creatinine: 0.64 mg/dL (ref 0.51–0.95)
GFR,Black: 113 mL/min/{1.73_m2}
GFR,Caucasian: 93 mL/min/{1.73_m2}
Glucose: 103 mg/dL — ABNORMAL HIGH (ref 60–99)
Lab: 19 mg/dL (ref 6–20)
Potassium: 4.2 mmol/L (ref 3.4–4.7)
Sodium: 139 mmol/L (ref 133–145)

## 2015-11-08 LAB — CBC
Hematocrit: 39.5 % (ref 34.1–44.9)
Hemoglobin: 13.5 g/dL (ref 11.2–15.7)
MCH: 32 pg (ref 25.6–32.2)
MCHC: 34.2 g/dL (ref 32.2–35.5)
MCV: 93.6 fL (ref 79.4–94.8)
Nucl RBC # K/uL: 0 /100 WBC (ref 0.0–0.2)
Platelets: 287 10*3/uL (ref 182–369)
RBC Distribution Width-SD: 43.6 fL (ref 36.4–46.3)
RBC: 4.22 10*6/uL (ref 3.93–5.22)
RDW: 12.6 % (ref 11.7–14.4)
WBC: 9.4 10*3/uL (ref 4.0–10.0)

## 2015-11-08 LAB — T4, FREE: Free T4: 1.91 ng/dL — ABNORMAL HIGH (ref 0.90–1.70)

## 2015-11-08 LAB — TSH: TSH: 0.08 u[IU]/mL — ABNORMAL LOW (ref 0.27–4.20)

## 2016-02-10 ENCOUNTER — Other Ambulatory Visit: Payer: Self-pay

## 2016-02-10 LAB — TSH: TSH: 0.79 u[IU]/mL (ref 0.27–4.20)

## 2016-08-29 ENCOUNTER — Other Ambulatory Visit: Payer: Self-pay | Admitting: Internal Medicine

## 2016-08-29 LAB — CBC
Hematocrit: 41 % (ref 34.1–44.9)
Hemoglobin: 13.6 g/dL (ref 11.2–15.7)
MCH: 32 pg (ref 25.6–32.2)
MCHC: 33.2 g/dL (ref 32.2–35.5)
MCV: 96.5 fL — ABNORMAL HIGH (ref 79.4–94.8)
Nucl RBC # K/uL: 0 /100 WBC (ref 0.0–0.2)
Platelets: 282 10*3/uL (ref 182–369)
RBC Distribution Width-SD: 45.9 fL (ref 36.4–46.3)
RBC: 4.25 10*6/uL (ref 3.93–5.22)
RDW: 12.9 % (ref 11.7–14.4)
WBC: 5.4 10*3/uL (ref 4.0–10.0)

## 2016-08-29 LAB — COMPREHENSIVE METABOLIC PANEL
ALT: 19 U/L (ref 0–35)
AST: 24 U/L (ref 0–35)
Albumin: 4.2 g/dL (ref 3.5–5.2)
Alk Phos: 69 U/L (ref 35–105)
Anion Gap: 9 (ref 7–16)
Bilirubin,Total: 0.6 mg/dL (ref 0.0–1.2)
CO2: 27 mmol/L (ref 20–28)
Calcium: 9.3 mg/dL (ref 8.6–10.2)
Chloride: 104 mmol/L (ref 96–108)
Creatinine: 0.78 mg/dL (ref 0.51–0.95)
GFR,Black: 89 mL/min/{1.73_m2}
GFR,Caucasian: 74 mL/min/{1.73_m2}
Glucose: 103 mg/dL — ABNORMAL HIGH (ref 60–99)
Lab: 19 mg/dL (ref 6–20)
Potassium: 4.2 mmol/L (ref 3.4–4.7)
Sodium: 140 mmol/L (ref 133–145)
Total Protein: 6.9 g/dL (ref 6.3–7.7)

## 2016-08-29 LAB — LIPID PANEL
Chol/HDL Ratio: 2.6 (ref 1.0–3.5)
Cholesterol: 219 mg/dL — ABNORMAL HIGH (ref 0–199)
HDL: 83 mg/dL — ABNORMAL HIGH (ref 40–60)
LDL Calculated: 122 mg/dL (ref 0–129)
Triglycerides: 70 mg/dL (ref 0–149)

## 2016-08-29 LAB — TSH: TSH: 1.26 u[IU]/mL (ref 0.27–4.20)

## 2016-11-15 IMAGING — MG 3D SCREENING MAMMO BIL W/CAD
5 series · 8 of 24 positions shown · non-contrast
Comparison: 

------------- REPORT [id number] -------------
Exam:  

Annual screening digital mammogram with 3D Tomosynthesis with CAD
INDICATION: Screening.

[R CC · right · 0.10mm/px · 2 of 3 slices shown]
[im 1/3]
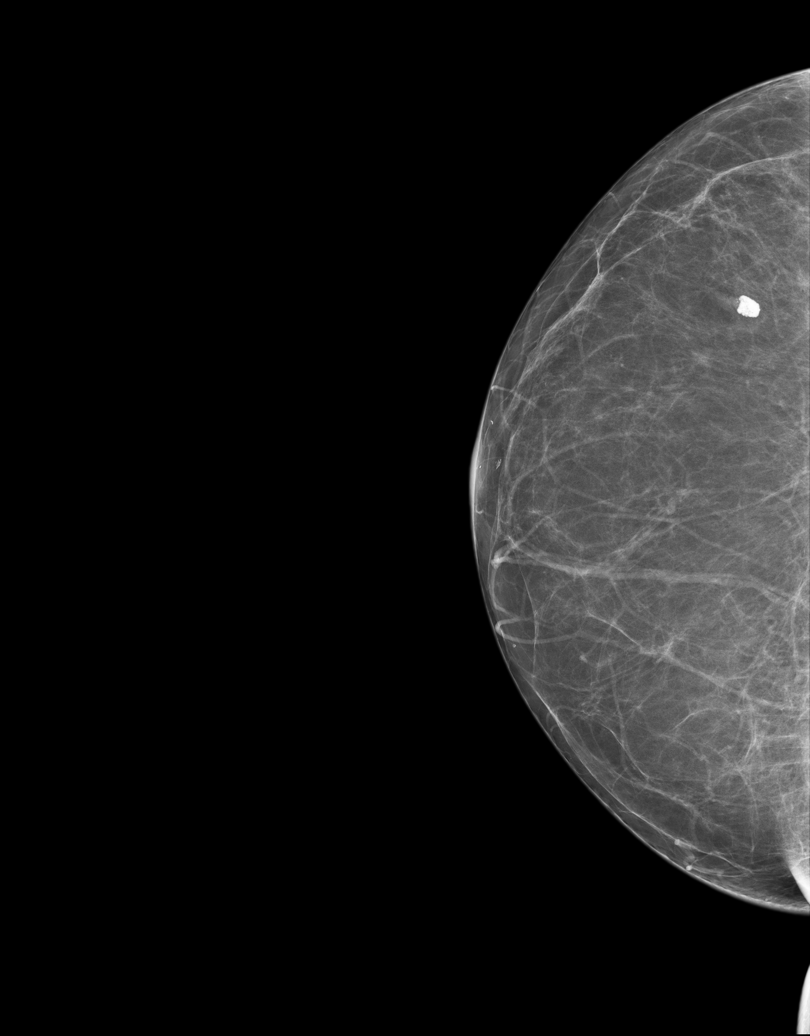
[im 3/3]
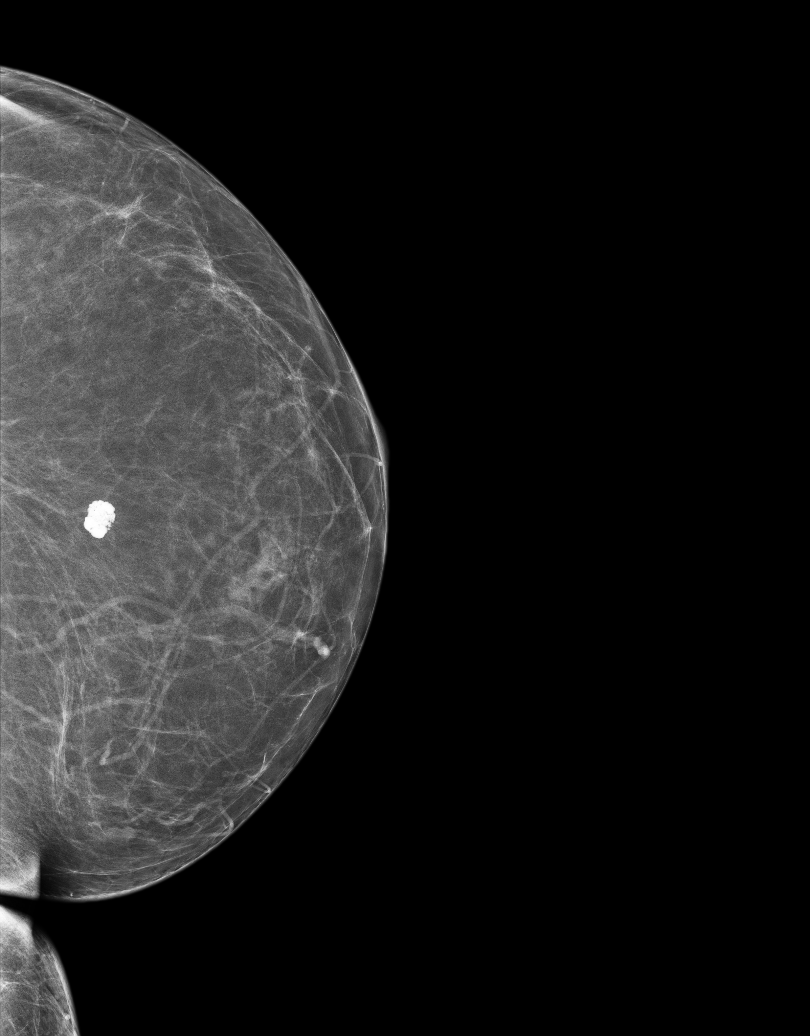

[3D SCREENING MAMMO BIL W/CAD · 2 acquisitions, 3 frames shown (1 of 2)]
[im 1/2]
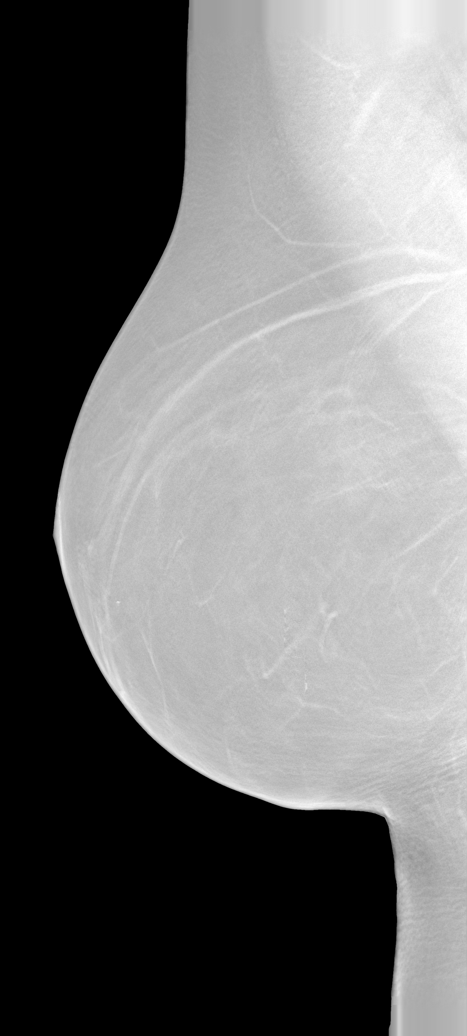
[im 2/2]
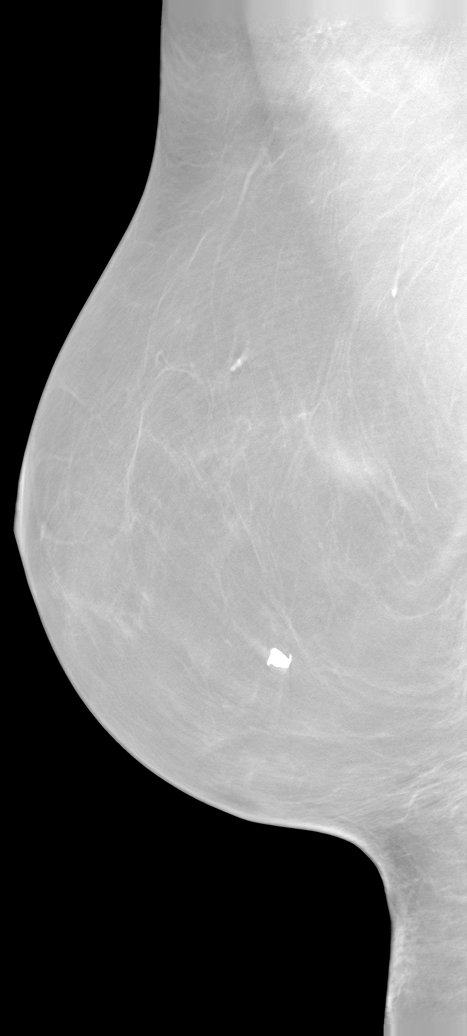
[im 2/2]
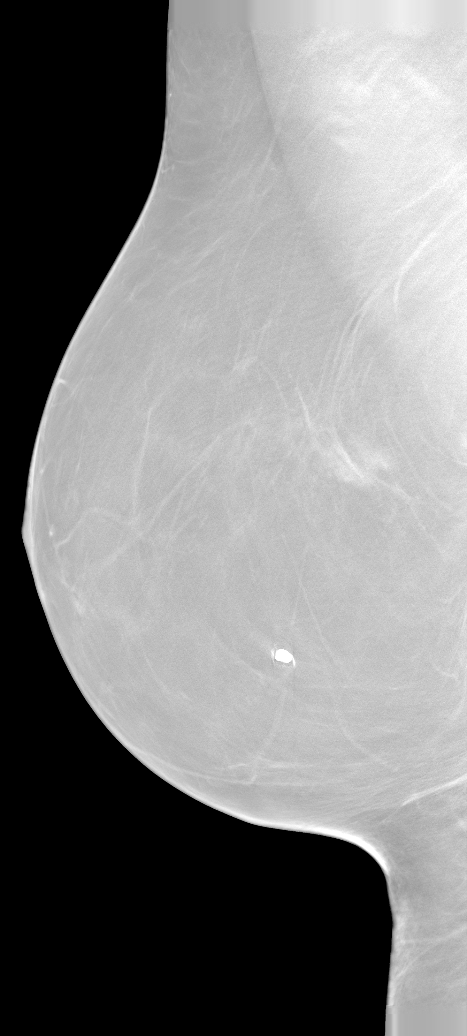

[R]
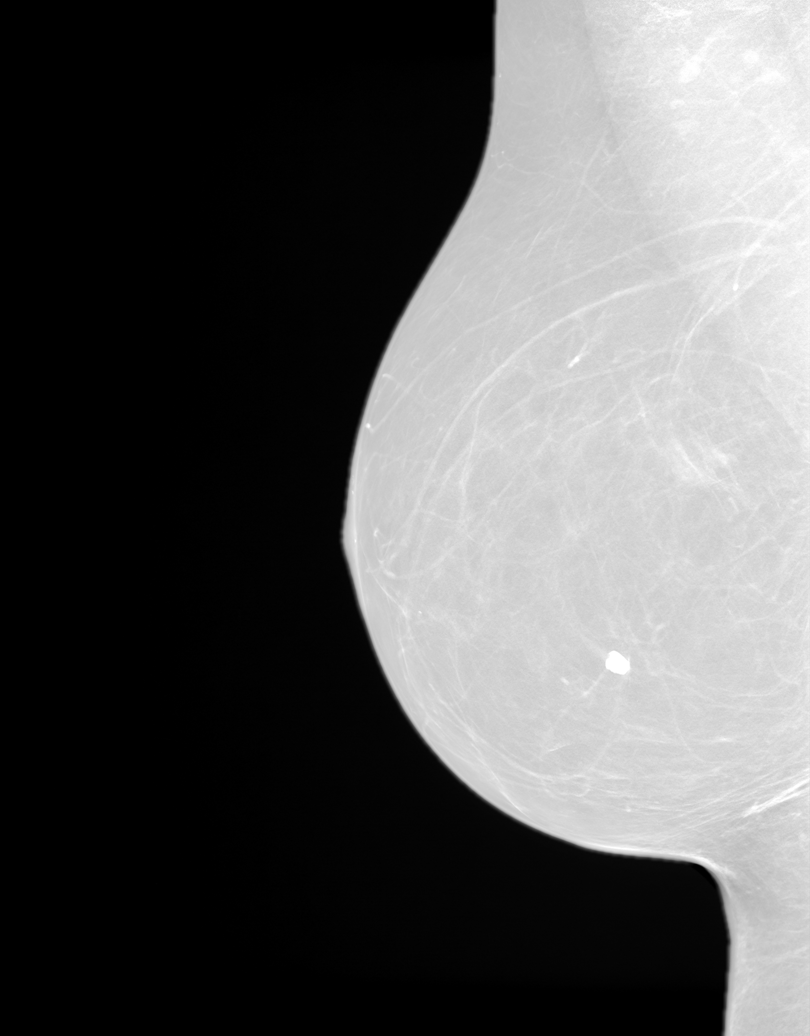

[3D SCREENING MAMMO BIL W/CAD (2 of 2) · tomo slice 14/86.0]
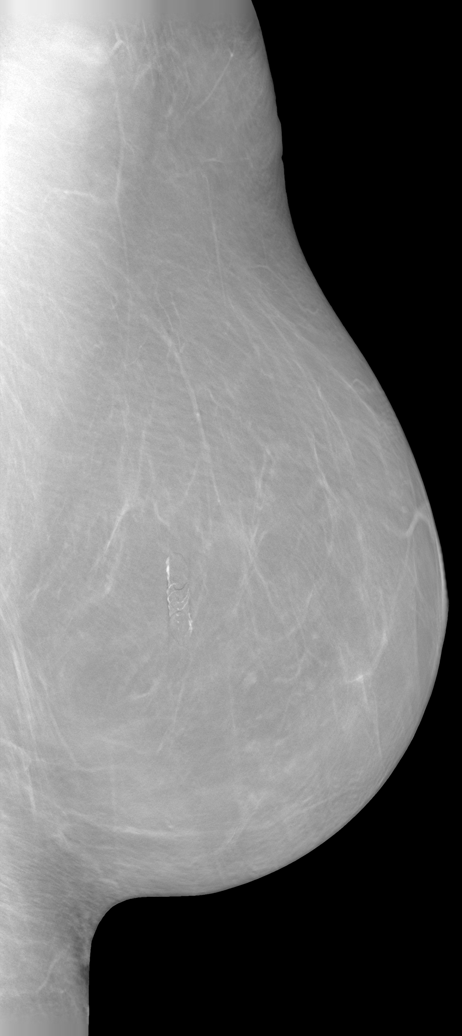

[L]
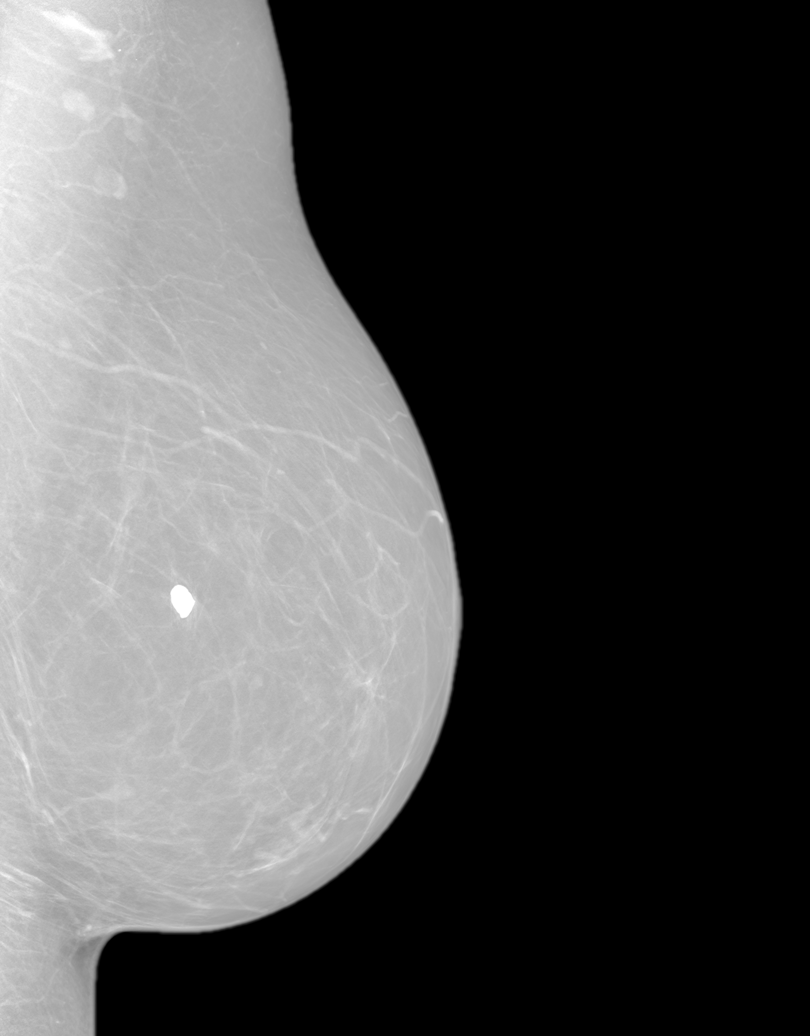

[8 of 24 positions shown; findings below may reference images not displayed]

FINDINGS: There are scattered fibroglandular elements. There is no mass or suspicious cluster of microcalcifications. There is no architectural distortion, skin thickening or nipple retraction.
IMPRESSION: BIRADS 2-Benign findings. Patient has been added in a reminder system with a

target date for the next screening mammography.

DENSITY CODE –  B (Scattered areas of fibroglandular density). 

Final Assessment Code:

Bi-Rads 2 

BI-RADS 0
Need additional imaging evaluation

BI-RADS 1
Negative mammogram

BI-RADS 2
Benign finding

BI-RADS 3
Probably benign finding: short-interval follow-up suggested

BI-RADS 4
Suspicious abnormality:  biopsy should be considered

BI-RADS 5
Highly suggestive of malignancy; appropriate action should be taken

BI-RADS 6
Known Biopsy-proven Malignancy – Appropriate action should be taken

NOTE:
In compliance with Federal regulations, the results of this mammogram are being sent to the patient.

------------- REPORT [id number] -------------
[facility]
[address]
We wish to report the following on your recent mammography examination. We are sending a report to your referring physician or other health care provider. 
(       Normal/Negative:
No evidence of cancer.
This statement is mandated by the [place], Department of Health.
Your examination was performed by one of our technologists, who are registered radiological technologists and also specially certified in mammography:
___
Frank, Zarina (M)
___
Huidrom, Rutik (M)

Your mammogram was interpreted by our radiologist.

( 
Jean Rousso Aniece, M.D.

(Annual Breast Examination by a physician or other health care provider
(Annual Mammography Screening beginning at age 40
(Monthly Breast Self Examination

## 2016-12-28 IMAGING — CR XRAY LUMBAR SPINE COMPLETE
1 series · 5 of 5 positions shown · non-contrast
Comparison: none

------------- REPORT [id number] -------------
Exam:  XRAY LUMBAR SPINE COMPLETE
INDICATION: Low back pain.

[Series 1: view not recorded · 0.17mm/px · 5 of 5 slices shown]
[im 1/5]
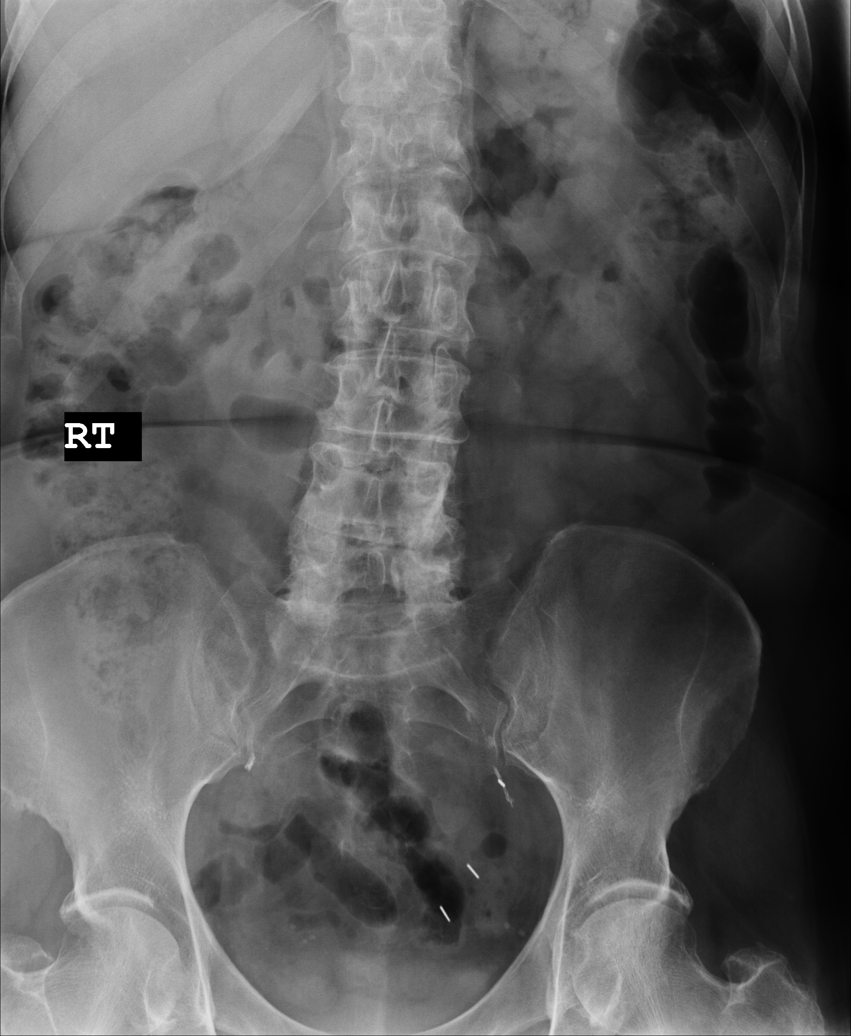
[im 2/5]
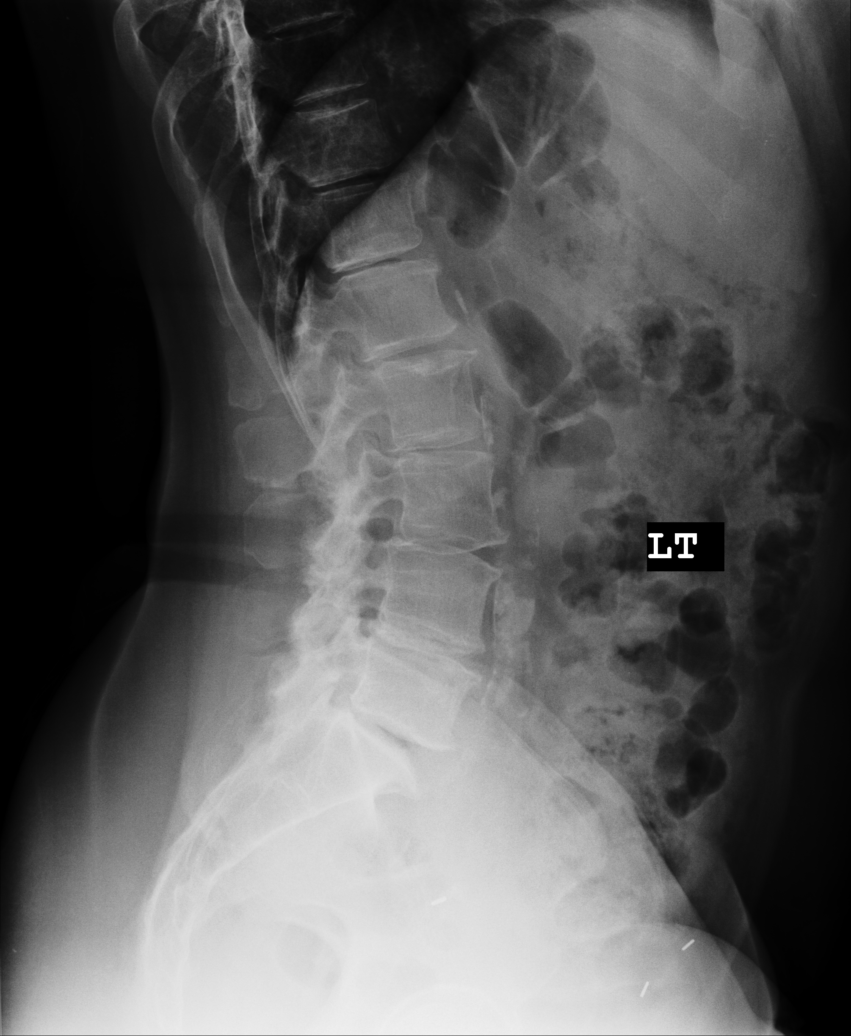
[im 3/5]
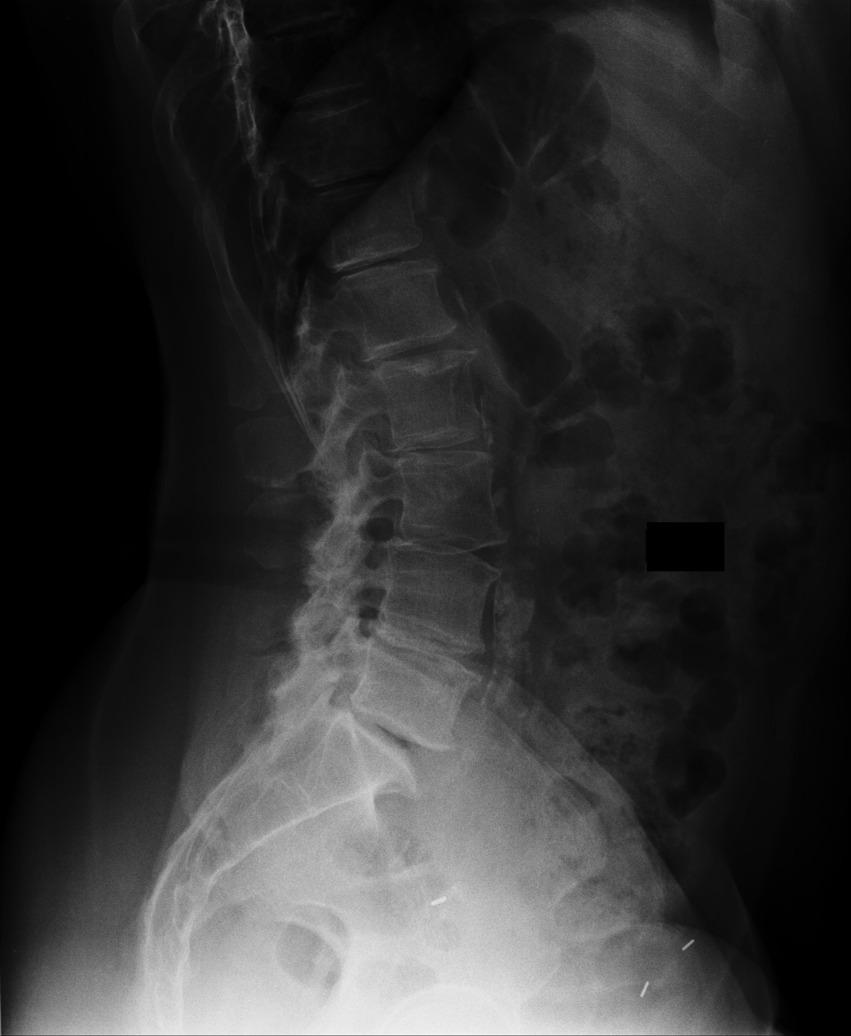
[im 4/5]
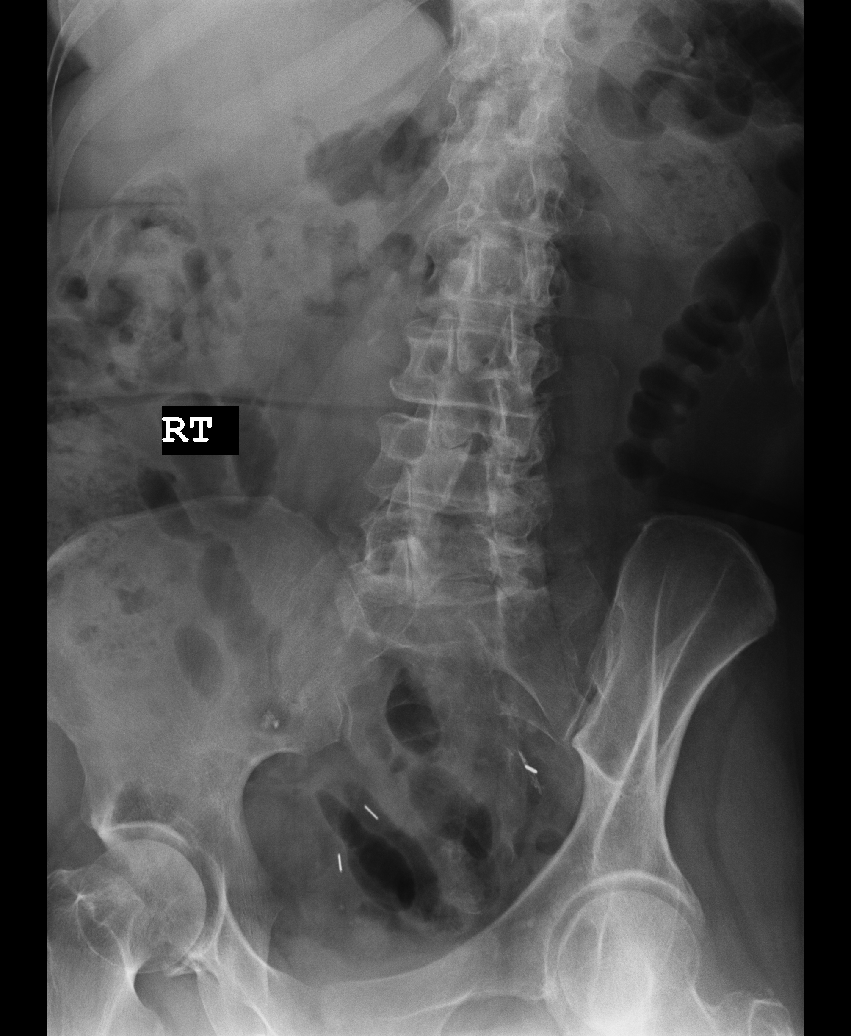
[im 5/5]
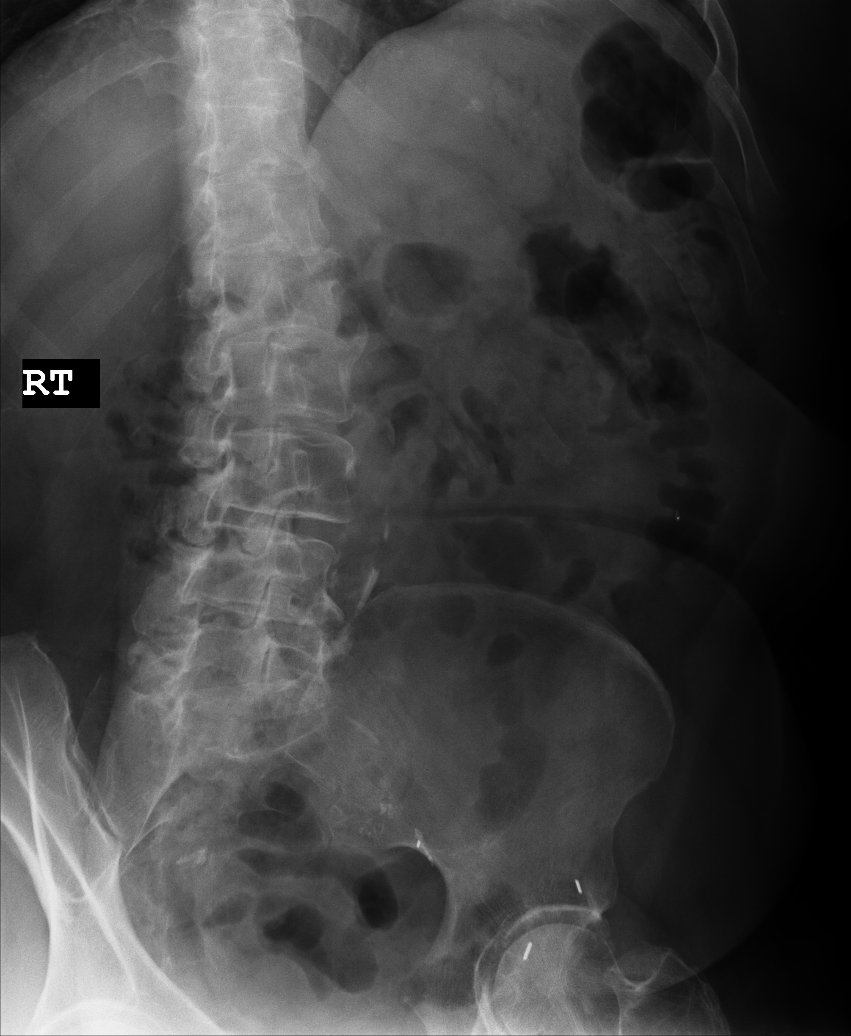

[5 of 5 positions shown; findings below may reference images not displayed]

FINDINGS: Multiple views of the lumbar spine shows on lateral projection all five lumbar vertebra.  There is a degenerative spondylosis from L1 to L5.  There is endplate sclerosis with hypertrophic bone formation at the discovertebral margins.  Noted on the examination is disc space narrowing at T12-L1, L1-2, L2-3, L3-4 and L4-5 and L5-S1.  No traumatic subluxations identified.  The anterior posterior view shows the pedicles and transverse processes to be intact.  Oblique views show no defects in the pars interarticularis.
IMPRESSION: Degenerative lumbar spondylosis, T12 through S1.  

Multi-level disc space narrowing.  

No fracture or traumatic subluxation.

------------- REPORT [id number] -------------
Exam:  XRAY LUMBAR SPINE COMPLETE
FINDINGS: Multiple views of the lumbar spine show on lateral projection all five lumbar vertebrae.  There is a degenerative spondylosis from L1 to L5.  There is endplate sclerosis with hypertrophic bone formation at the discovertebral margins.  Noted on the examination is disc space narrowing at T12-L1, L1-L2, L2-L3, L3-L4, L4-L5 and L5-S1.  No traumatic subluxation is identified.  The anterior-posterior view shows the pedicles and transverse processes to be intact.  Oblique views show no defects in the pars interarticularis.
IMPRESSION: Degenerative lumbar spondylosis T12 through S1.  

Multilevel disc space narrowing.

No fracture or traumatic subluxation.

## 2016-12-28 IMAGING — CR XRAY KNEE [DATE] VIEWS LT
1 series · 3 of 3 positions shown · non-contrast
Comparison: none

Exam:  XRAY KNEE [DATE] VIEWS LT
INDICATION: Pain.

[Series 1: view not recorded · 0.17mm/px · 3 of 3 slices shown]
[im 1/3]
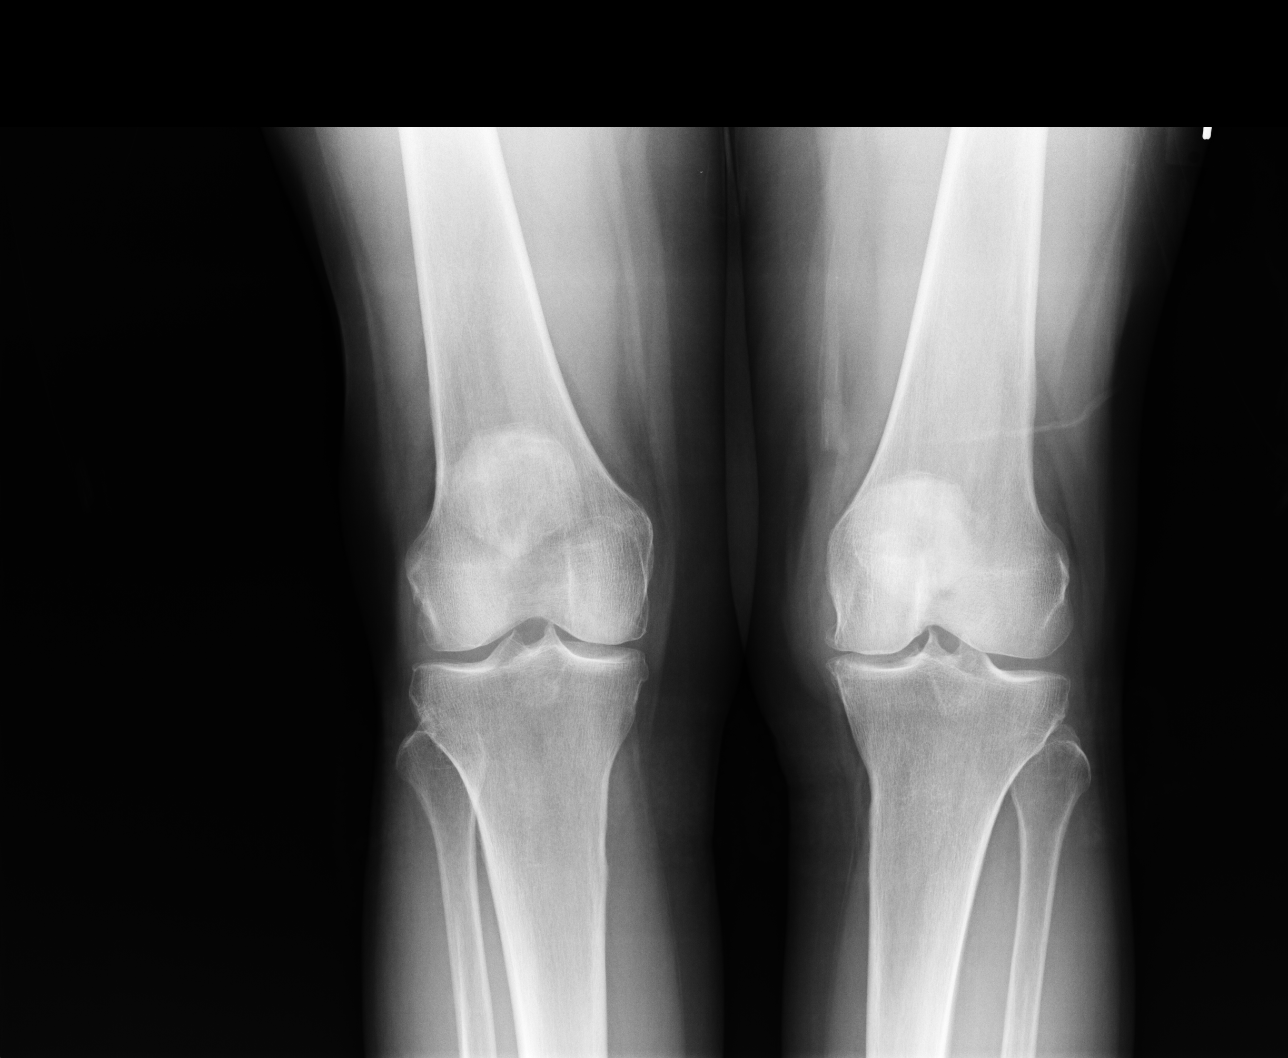
[im 2/3]
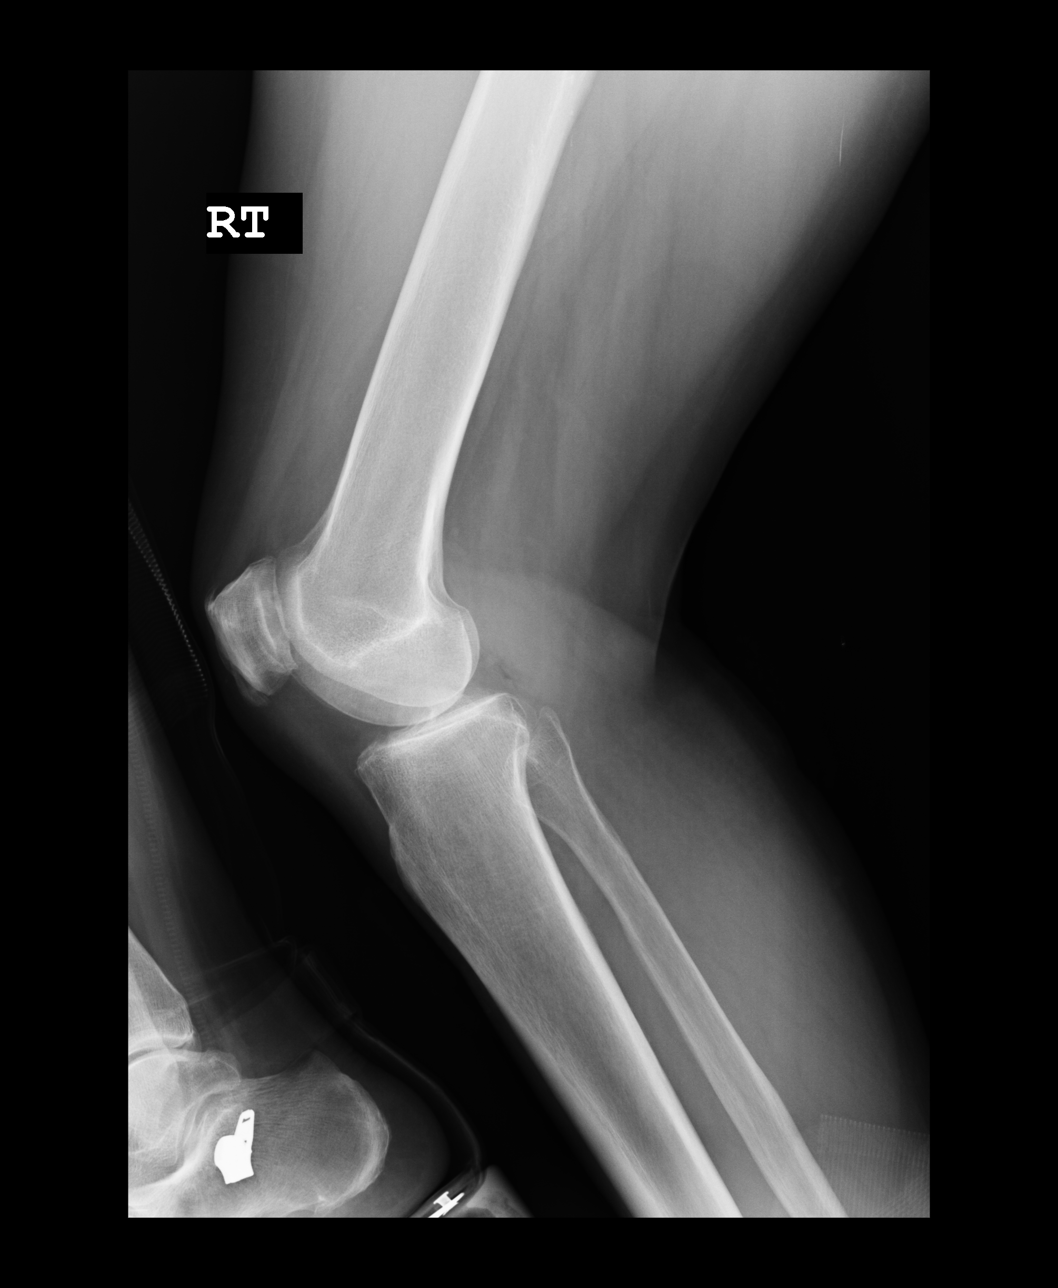
[im 3/3]
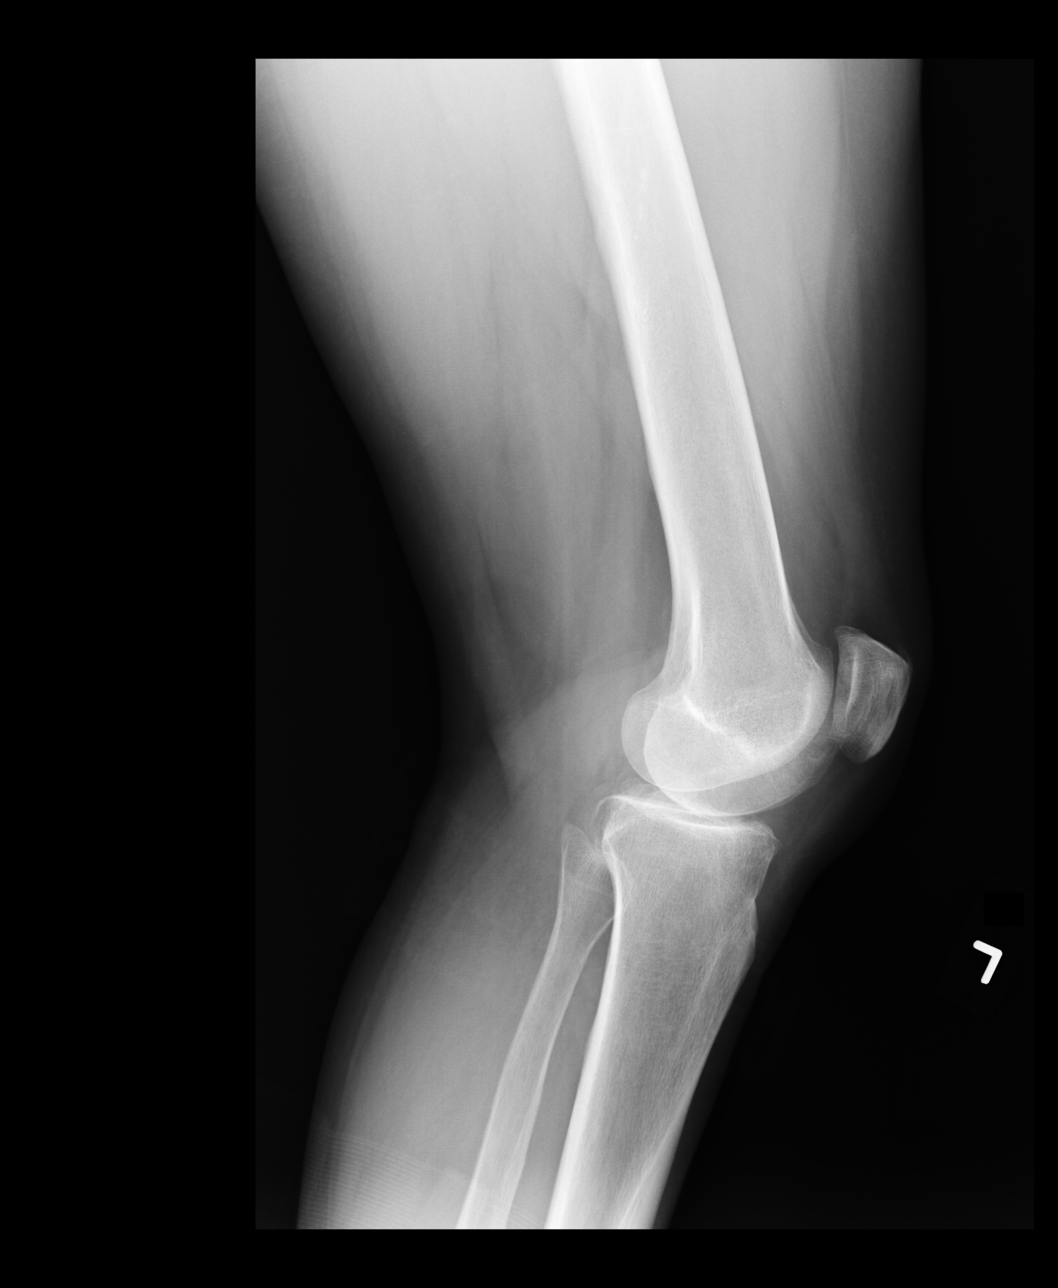

[3 of 3 positions shown; findings below may reference images not displayed]

FINDINGS: An AP and lateral view of the patient’s left knee shows medial compartment joint space narrowing with subchondral sclerosis and hypertrophic bone formation at the joint margins.  The findings are compatible with osteoarthritis.  No fracture or dislocations observed.  The lateral view shows no radiographic evidence for an intra-articular effusion.
IMPRESSION: Medial compartment osteoarthritis.

No fracture or dislocation. 

Exam:
XRAY KNEE [DATE] VIEWS RT
FINDINGS: An AP and lateral view of the right knee shows normal alignment with adequate joint space.  No fracture or dislocation is seen.  The lateral view shows degenerative changes in the patellofemoral space.  No evidence for an intra-articular effusion is seen.
IMPRESSION: Degenerative changes in the right patellofemoral joint.  

The remainder of the right knee is unremarkable.

## 2016-12-28 IMAGING — CR XRAY HAND COMPLETE LT
1 series · 3 of 3 positions shown · non-contrast
Comparison: none

Exam:  XRAY HAND COMPLETE LEFT AND RIGHT
INDICATION: Bilateral hand pain.

[Series 1: view not recorded · 0.17mm/px · 3 of 3 slices shown]
[im 1/3]
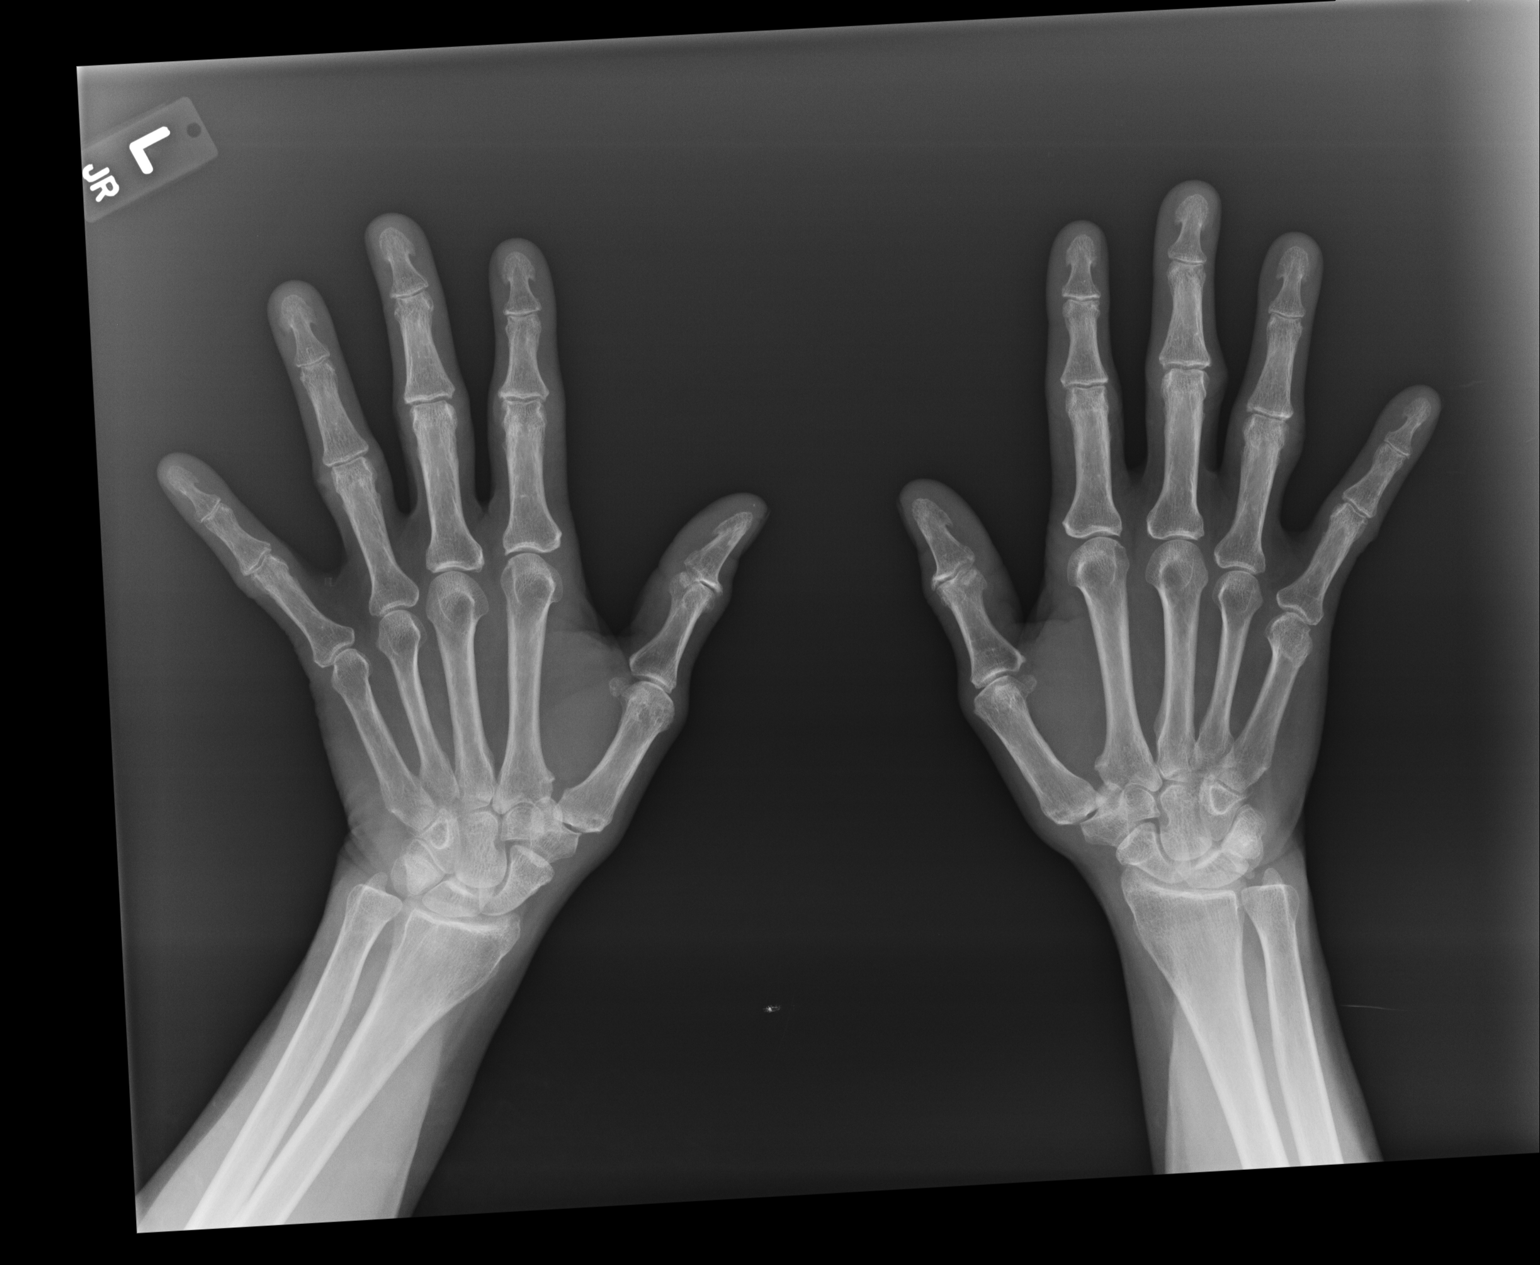
[im 2/3]
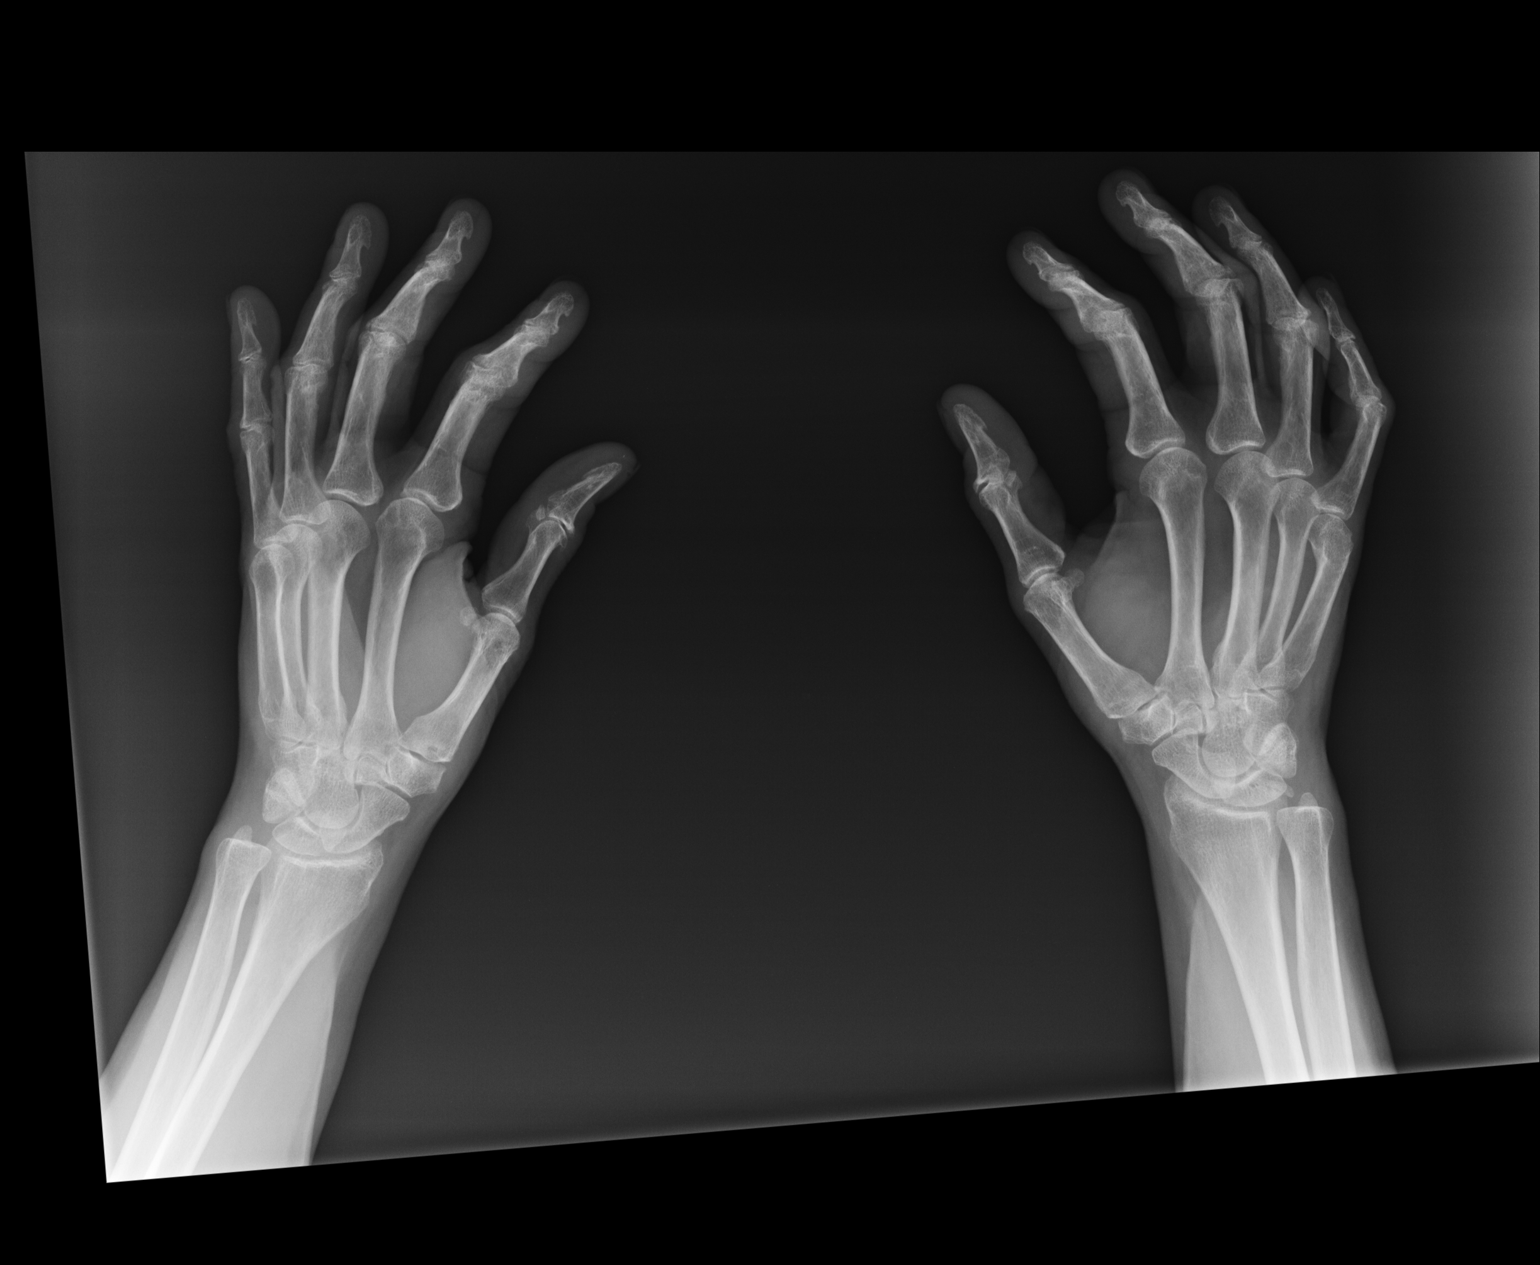
[im 3/3]
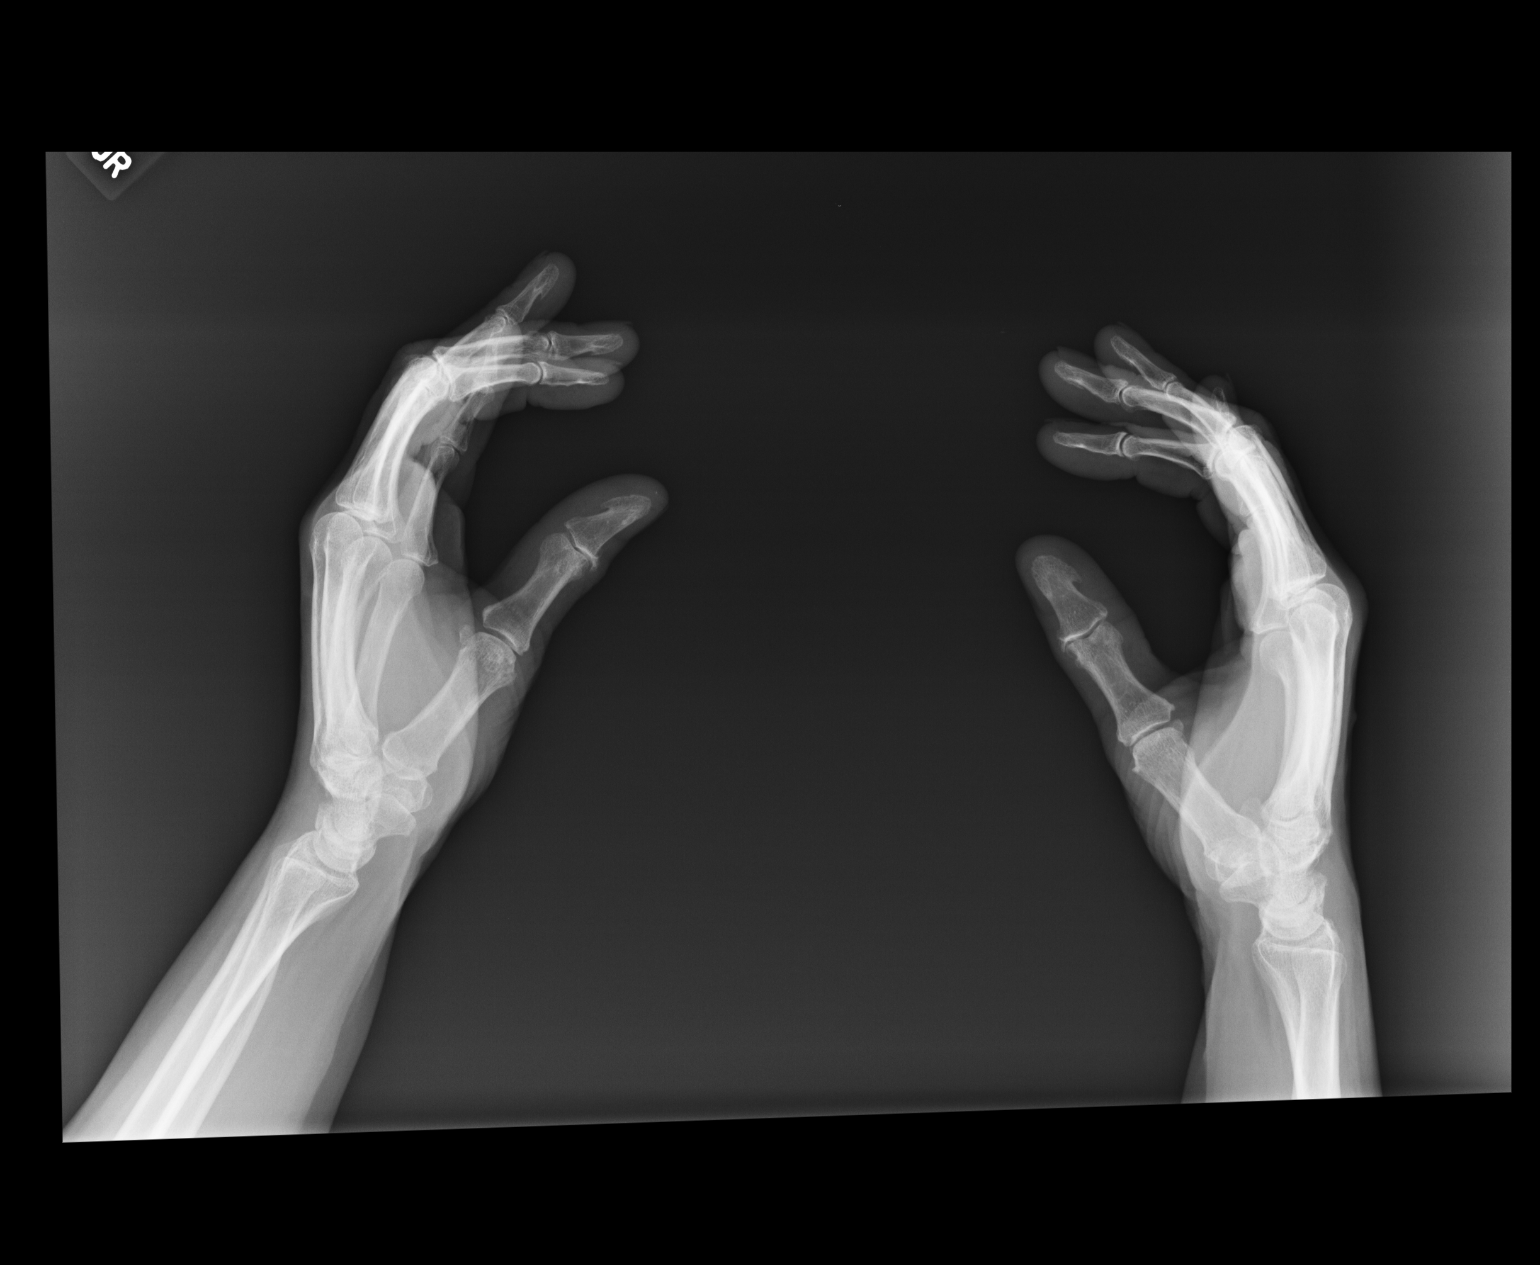

[3 of 3 positions shown; findings below may reference images not displayed]

FINDINGS: Examination of the patient’s hands shows no evidence for periarticular demineralization, marginal erosions or ulnar subluxation.  No soft tissue thickening is seen.  There are mild degenerative changes noted at the first carpometacarpal joint bilaterally.  The right hand has changes that appear more severe than the left.  The distal radius and ulna appear intact.
IMPRESSION: Bilateral changes of osteoarthritis at the first carpometacarpal joints.  

Changes in the right hand are more severe than the left.

No other evidence for a fracture, dislocation or a destructive bone lesion.

## 2016-12-28 IMAGING — CR XRAY PELVIS [DATE] VIEWS
1 series · 1 of 1 positions shown · non-contrast
Comparison: none

Exam:  XRAY PELVIS 1/2 VIEWS
INDICATION: Pain.

[view not recorded]
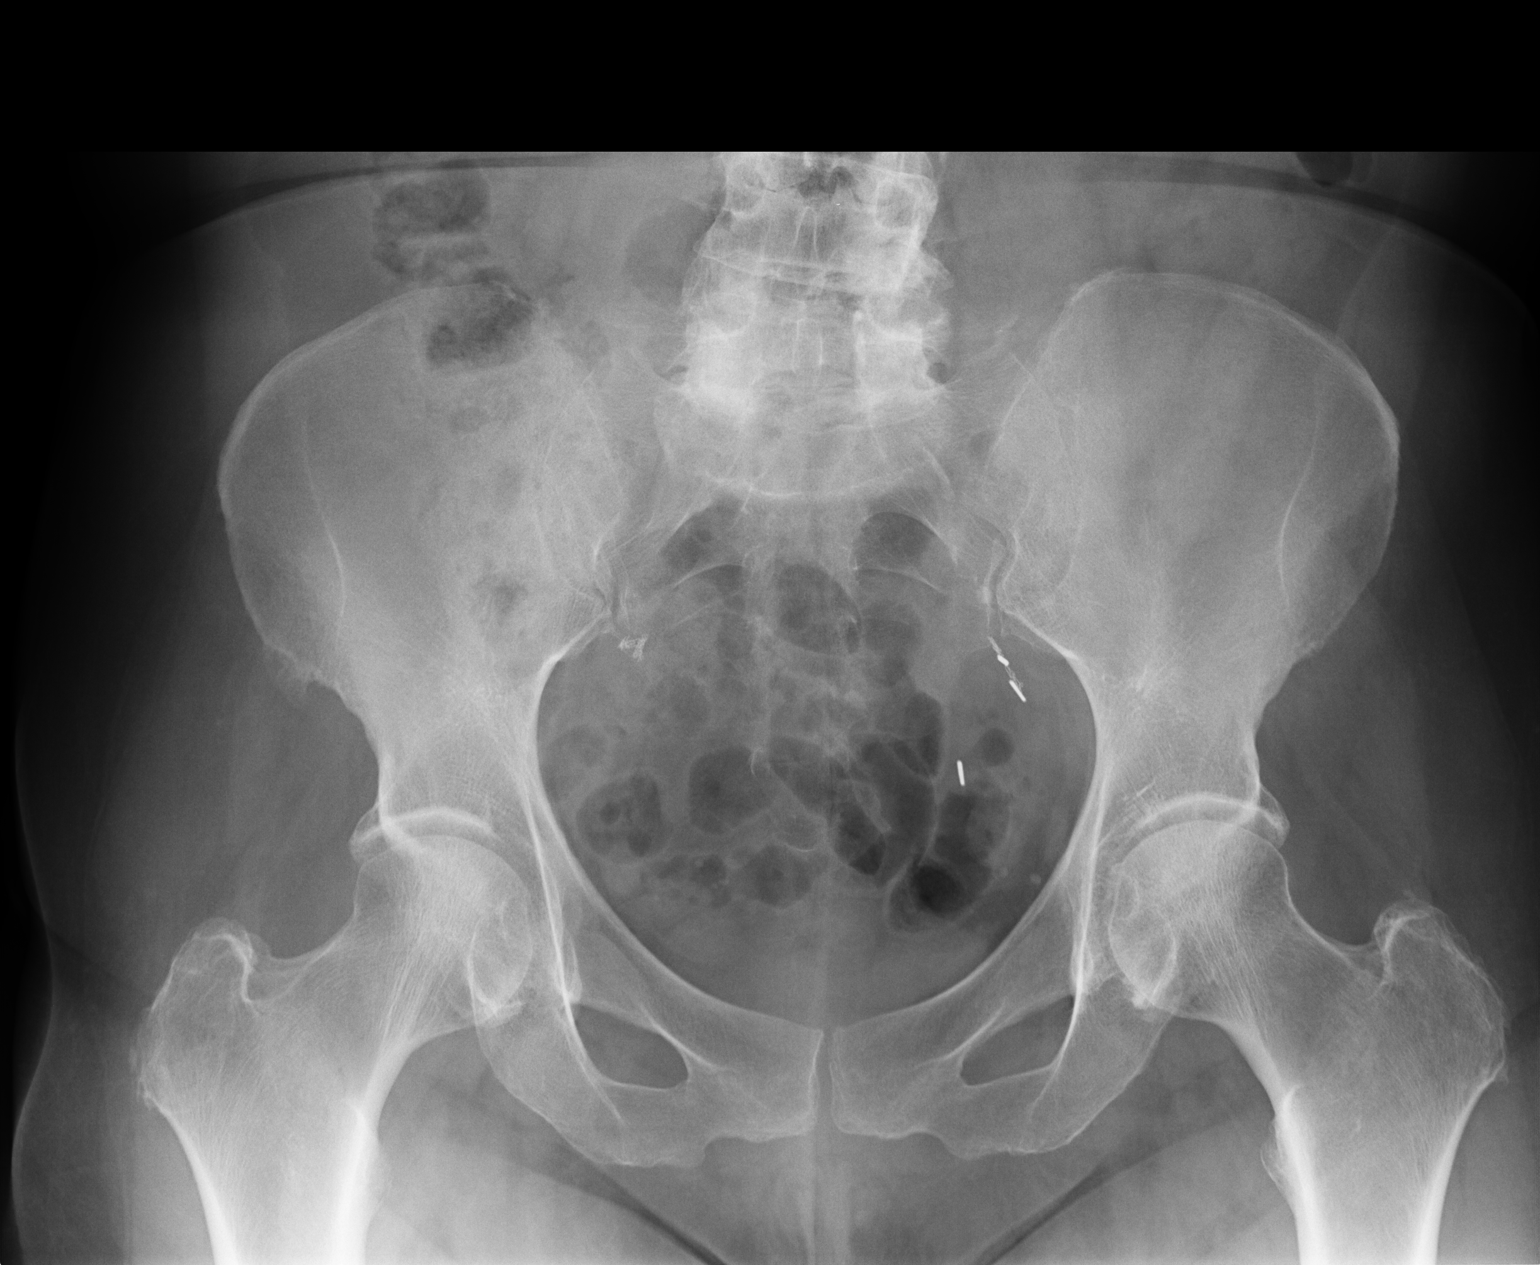

[1 of 1 positions shown; findings below may reference images not displayed]

FINDINGS: An AP view of the patient’s pelvis shows the bony ring to appear intact.  No fracture line or destructive bony lesion is seen.  The SI joints appear within normal limits.  Both proximal femurs are seated in their respective acetabuli.
IMPRESSION: Unremarkable AP view of the pelvis.

## 2017-01-21 ENCOUNTER — Inpatient Hospital Stay: Admit: 2017-01-21 | Discharge: 2017-01-21 | Disposition: A | Discharge status: Home / Self Care | Payer: Self-pay

## 2017-01-21 ENCOUNTER — Other Ambulatory Visit: Payer: Self-pay | Admitting: Gastroenterology

## 2017-01-22 ENCOUNTER — Inpatient Hospital Stay: Admit: 2017-01-22 | Discharge: 2017-01-22 | Disposition: A | Discharge status: Home / Self Care | Payer: Self-pay

## 2017-01-22 ENCOUNTER — Other Ambulatory Visit: Payer: Self-pay | Admitting: Gastroenterology

## 2017-01-22 ENCOUNTER — Other Ambulatory Visit
Admission: RE | Admit: 2017-01-22 | Discharge: 2017-01-22 | Disposition: A | Discharge status: Home / Self Care | Payer: Medicare (Managed Care) | Source: Ambulatory Visit | Attending: Pathology | Admitting: Pathology

## 2017-01-22 DIAGNOSIS — N6092 Unspecified benign mammary dysplasia of left breast: Secondary | ICD-10-CM | POA: Insufficient documentation

## 2017-01-23 LAB — SURGICAL PATHOLOGY

## 2017-01-25 ENCOUNTER — Telehealth: Payer: Self-pay | Admitting: Surgical Oncology

## 2017-01-25 NOTE — Telephone Encounter (Signed)
Patient called to schedule NPV with Dr. Bennie Hind; pt is aware of scheduled appt 02/15/17 at 10:15 AM.

## 2017-01-31 ENCOUNTER — Other Ambulatory Visit: Payer: Self-pay | Admitting: Gastroenterology

## 2017-01-31 ENCOUNTER — Inpatient Hospital Stay: Admit: 2017-01-31 | Discharge: 2017-01-31 | Disposition: A | Discharge status: Home / Self Care | Payer: Self-pay

## 2017-02-07 ENCOUNTER — Other Ambulatory Visit: Payer: Self-pay

## 2017-02-15 ENCOUNTER — Other Ambulatory Visit: Payer: Self-pay | Admitting: Cardiology

## 2017-02-15 ENCOUNTER — Encounter: Payer: Self-pay | Admitting: Surgical Oncology

## 2017-02-15 ENCOUNTER — Ambulatory Visit: Payer: Medicare (Managed Care) | Attending: Surgical Oncology | Admitting: Surgical Oncology

## 2017-02-15 VITALS — BP 133/74 | HR 92 | Temp 98.1°F | Ht 61.38 in | Wt 168.0 lb

## 2017-02-15 DIAGNOSIS — D0512 Intraductal carcinoma in situ of left breast: Secondary | ICD-10-CM | POA: Insufficient documentation

## 2017-02-15 DIAGNOSIS — N6092 Unspecified benign mammary dysplasia of left breast: Secondary | ICD-10-CM | POA: Diagnosis present

## 2017-02-15 DIAGNOSIS — N62 Hypertrophy of breast: Secondary | ICD-10-CM | POA: Insufficient documentation

## 2017-02-15 DIAGNOSIS — I499 Cardiac arrhythmia, unspecified: Secondary | ICD-10-CM

## 2017-02-15 HISTORY — DX: Unspecified benign mammary dysplasia of left breast: N60.92

## 2017-02-15 NOTE — Progress Notes (Signed)
[facility] SOCIAL WORK:   [facility] Social Work Therapist, nutritional:     Patient referred based on distress screen?: Yes      Social Work contact made?: Yes      Method of contact:  In-person    Risk Factors:  Emotional and [place]/Family Distress    Social Work post assessment plan:  As needed follow-up  Referral:     Time spent on this encounter (in minutes):  15    Patient was referred to writer due to distress score of 8/10.  Patient presented to clinic for a surgical discussion with Dr. Bennie Hind regarding ADH.  The patient was tearful and reported continued anxiety around her adult son with mental health issues.  Patient is aware of how to get counseling assistance for herself and make phone calls to appropriate individuals if son is expressing intent to harm himself or others.  Writer provided patient with support and validation of feelings.  Social work will continue to be available as needed.     Sherryl Barters, [place]  Oncology Social Worker  [phone]

## 2017-02-15 NOTE — Patient Instructions (Addendum)
Pre-Operative Instructions [facility]   [place], [place] [address]             Your surgery is scheduled for 02/28/17.    PRIOR TO SURGERY    Five days before surgery, please STOP taking if surgeon does not specify:     Anti-inflammatory medications (Ibuprofen, Motrin, Advil, Mobic, Meloxicam, Aleve, Naproxen, Voltaren, etc.)   Vitamins and herbal supplements, including herbal teas    YOU MAY TAKE ACETAMINOPHEN (TYLENOL) as needed    FOLLOW YOUR SURGEON'S INSTRUCTIONS IF DIFFERENT THAN ABOVE.    DAY BEFORE SURGERY    Staff from [facility] will call you by 6pm the evening before the surgery to inform you of your arrival time.  The call will be Friday afternoon if your surgery is on a Monday. If you have not received a phone call by 6pm, please phone [phone] and ask to be transferred to the [facility] [place] [facility].  Please arrange for transportation to and from the hospital. You must have a responsible adult to stay with you after your surgery.  Do not eat anything after midnight the night before your surgery.   Clear liquids are encouraged from midnight until 2 hours before your scheduled surgery. Examples include: Gatorade, clear apple or cranberry juice, water, soda, black coffee or tea.  If you have been instructed, please perform your skin prep as directed.                                                                                         DAY OF SURGERY    Clear liquids only until to 2 hours before your scheduled surgery time.  Please bring photo identification and your insurance card with you for registration.  If you are female, please be prepared to provide a urine sample on arrival to the preoperative area unless you are one year post menopause or have had a hysterectomy.   DO NOT WEAR: JEWELRY, MAKEUP, DARK NAIL POLISH, HAIR PINS, BODY LOTION OR SCENTS.    Please understand that rings and body piercings must be removed prior to surgery. If they are not  removed your surgery is at risk of being delayed or cancelled. Please see a jeweler before your surgery if you cannot remove an item yourself  Please do not bring valuables/electronics/cash with you on the day of surgery.  [facility] is not responsible for loss/damage/theft of these items.  Thank you.  If wearing eyeglasses, please bring a case.    DO NOT WEAR CONTACT LENSES.   You may shower, brush your teeth, and use deodorant.   If you have been instructed, please perform your skin prep as directed.   PLEASE BRING YOUR CPAP MACHINE INTO THE PREOPERATIVE AREA TO BE CHECKED IF YOU ARE STAYING OVERNIGHT, OTHERWISE YOU MAY LEAVE IT IN YOUR VEHICLE.Marland Kitchen   Please bring any medical supplies or equipment requested by your surgeon (Example: Back/neck brace).   Patients are permitted 2 visitors at a time.   All visitors must be 57 yrs of age or older.    MEDICATIONS: DAY  OF SURGERY     Take your medications with a as directed on the medication list.   Anxiety and pain medications may be taken as prescribed at any time prior to arrival.   Bring your inhalers and eye drops on the day of surgery to be used as prescribed.  Medications from the [facility] will be administered to you under the direction of your surgeon. Please leave your prescriptions at home.    AT [place] in the Main Ramp garage.  Enter the building through the [employer].  Please stop at the Information Desk so that they may direct you to [facility] [address].   Leave your belongings in the car (except for your CPAP) and your visitors may bring them to your room after surgery.    [place]    As a convenience, prior to your discharge we will provide any discharge medications you require.     The pharmacy is open from 9am to 5:30pm on weekdays and 10am-2pm on Saturdays.      If you will be alone on the day of surgery and want to leave with your prescribed  medication, you may:             Bring a check made out to [facility]           Use a credit card to pay for your prescription           Have your family call the pharmacy with a credit card number           Only bring cash to the hospital as a last resort. Prescriptions cannot be filled without payment. Thank you for your consideration.      QUESTIONS?     Question about these instructions? Call [phone], select option 2, and leave a message for a nurse to return your call.   Any questions regarding specifics about your surgery or recovery? Please call your surgeons office.        [facility]  Telephone: [phone]    Breast Biopsy Postoperative Instructions    Wound Care:    The wound is sealed with tissue glue. Do not pick at the glue. Do not put any creams, lotions or ointments over the glue. It will begin to peel off on its own after 1-2 weeks, at which point you can pull it off.     You may shower beginning the day after your surgery. Pat your incision(s) dry afterwards. Do not soak your incision in a tub, hot tub, or pool for 7 days after your surgery.      The stitches are all under the skin and will dissolve on their own. They do not need to be removed.     You may benefit from wearing a sports bra or snuggly fitting bra around the clock for the first 3-5 days. This will compress the breast and may keep you much more comfortable.     Avoid jogging, aerobics or other strenuous activity for 2 weeks to allow the breast to heal. Walking or exercising on a stationary bicycle is permitted. (In other words, you don't want to engage in any activities that shake up your breast too much. It can cause pain and bleeding)    Postoperative Pain Management:   You should not have a lot of pain. The local anesthetic you received will wear  off after 6-8 hours. For pain, use Tylenol or Ibuprofen (Advil, Aleve, Motrin, etc).    When to call:   Call if you have bleeding, severe pain,  swelling, redness around the wound, or fever.     Call if you have any questions.    We will call you with the results.

## 2017-02-16 LAB — EKG 12-LEAD
P: 67 deg
PR: 156 ms
QRS: 29 deg
QRSD: 80 ms
QT: 404 ms
QTc: 469 ms
Rate: 81 {beats}/min
Severity: NORMAL
T: 66 deg

## 2017-02-17 ENCOUNTER — Encounter: Payer: Self-pay | Admitting: Surgical Oncology

## 2017-02-17 NOTE — H&P (Signed)
[name]  [id number]    Referring Physician: No referring provider defined for this encounter.    Chief Complaint: The patient is referred for consultation regarding management of her atypical hyperplasia left breast.  HPI: [name] is a pleasant 67 y.o. White female seen on 02/15/2017 for consultation at [place] [facility].  She was unaware of any problems with her breasts when she presented for a routine screening mammogram.  A 2.5 cm area of microcalcifications was identified in the left breast prompting core needle biopsy.  That showed atypical hyperplasia.  She denies lumps, masses, nipple retraction, nipple discharge, or skin changes.  She has a history of breast cysts but his never had any breast surgery or biopsies.  She began getting mammograms around the age of 32 and performs monthly self examination.  She is otherwise in reasonably good health but she is quite emotional today as she is having some difficulties with her son.  I offered her social work assistance but she declined.    Gynecologic History:  She had her first menstrual period around the age of 14 and her last around the age of 54.  She went through natural menopause.  She used an IUD for birth control in the past but did not use hormonal preparations.  She never had fertility treatments and did not use hormone replacement therapy.  She had 3 pregnancies and 3 live births.  Her first pregnancy was at the age of 82 and the last at the age of 39.  She breast-fed for a total of 9 months.    Medical History:   Past Medical History:   Diagnosis Date    Mitral valve prolapse     Thyroid disease          Medications:   Current Outpatient Prescriptions   Medication Sig    levothyroxine (SYNTHROID, LEVOTHROID) 100 MCG tablet Take 112 mcg by mouth daily (before breakfast)       aspirin 325 MG EC tablet Take 1 tablet (325 mg total) by mouth daily    naproxen (NAPROSYN) 500 MG tablet Take 500 mg by mouth 2 times daily (with meals)      No current facility-administered medications for this visit.        Allergies: has No Known Allergies (drug, envir, food or latex).    Surgical history:   Past Surgical History:   Procedure Laterality Date    KNEE SURGERY Left     TONSILLECTOMY         Social:   Social History     Social History    Marital status: Married     Spouse name: N/A    Number of children: N/A    Years of education: N/A     Occupational History    Not on file.     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 Pertinent items are noted in HPI.      Physical Exam:  Vitals: BP 133/74    Pulse 92    Temp 36.7 C (98.1 F) (Temporal)    Ht 155.9 cm (5' 1.38")    Wt 76.2 kg (167 lb 15.9 oz)    SpO2 98%    BMI 31.35 kg/m   General appearance: alert, appears stated age and cooperative  Head: Normocephalic, without obvious abnormality, atraumatic  Eyes: conjunctivae/corneas clear. PERRL, EOM's intact.  Ears: External ears are normal, hearing is grossly intact.  Nose: Nares normal and patent.  Throat: lips, mucosa, and tongue normal; teeth and gums normal  Neck: no adenopathy, supple, symmetrical, trachea  midline and thyroid not enlarged, symmetric, no tenderness/mass/nodules  Back: symmetric, no curvature. ROM normal. No CVA tenderness.  Lungs: clear to auscultation bilaterally  Heart: regular rate and rhythm, S1, S2 normal, no murmur, click, rub or gallop  Abdomen: Soft, nontender, nondistended, no organomegaly, no palpable masses.  Extremities: extremities normal, atraumatic, no cyanosis or edema  Pulses: 2+ and symmetric  Skin: Skin color, texture, turgor normal. No rashes or lesions  Lymph nodes: Cervical, supraclavicular, and axillary nodes normal.  Neurologic: Grossly normal  Breasts: Moderate to large size and ptotic.  No skin changes on either side.  Nipples normally everted without discharge.  No palpable abnormalities of the fibroglandular tissue on either side.    Imaging:     All breast imaging studies were personally reviewed by [place] and findings are per the HPI.    Pathology:   All pathology reports were personally reviewed by [place] and findings are per the HPI. Slides have been requested for our review.      Impression/Plan:  This is a 67 year old with a new diagnosis of atypical hyperplasia of the left breast.  We discussed the diagnosis in detail.  She understands that there is about a 10% incidence of associated cancer 1 surgical biopsies are done for this diagnosis.  She further understands that it represents a risk factor for breast cancer in both breasts not simply at the site of disease.  She also understands that it can progress to breast cancer if left untreated.  We discussed surgical excision including wire localization as well as the expected postoperative course.  She is agreeable to proceed.  We discussed the risks and complications including bleeding, infection, wound problems, fluid collections, and the need for further surgery.  She signed consent today and preoperative teaching was completed.  I'll schedule surgery at her earliest possible convenience.

## 2017-02-18 ENCOUNTER — Other Ambulatory Visit: Payer: Self-pay | Admitting: Surgical Oncology

## 2017-02-18 DIAGNOSIS — N6092 Unspecified benign mammary dysplasia of left breast: Secondary | ICD-10-CM

## 2017-02-20 ENCOUNTER — Other Ambulatory Visit: Payer: Self-pay | Admitting: Radiology

## 2017-02-20 ENCOUNTER — Other Ambulatory Visit: Payer: Self-pay | Admitting: Gastroenterology

## 2017-02-20 NOTE — Plan of Care (Signed)
Problem: Knowledge deficit related to pre or post-op regimens  Goal: Patient verbalizes understanding of teaching  Outcome: Completed or Resolved Date Met: 02/20/17

## 2017-02-20 NOTE — Telephone (Signed)
Pre-Operative Instructions Breast Care                           PRIOR TO SURGERY-1-12    Five days before surgery, please STOP taking if surgeon does not specify:     Anti-inflammatory medications (Ibuprofen, Motrin, Advil, Mobic, Meloxicam, Aleve, Naproxen, Voltaren, etc.)   Vitamins and herbal supplements, including herbal teas    YOU MAY TAKE ACETAMINOPHEN (TYLENOL) as needed    FOLLOW YOUR SURGEON'S INSTRUCTIONS IF DIFFERENT THAN ABOVE.    DAY BEFORE SURGERY-1-16     Staff from [facility] will call you by 6pm the evening before the surgery to inform you of your arrival time.  The call will be Friday afternoon if your surgery is on a Monday. If you have not received a phone call by 6pm, please phone [phone] and ask to be transferred to the [place].   Please arrange for transportation to and from the hospital. You must have a responsible adult to stay with you after your surgery.   Do not eat anything after midnight the night before your surgery.    If you have been instructed, please perform your skin prep as directed.                                                                                         DAY OF SURGERY-1-17     DO NOT CONSUME FOOD OF ANY KIND.   Gatorade, clear apple juice,or water are encouraged from midnight until 2 hours before your scheduled surgery.   Please bring photo identification and your insurance card with you for registration.   DO NOT WEAR: JEWELRY, MAKEUP, DARK NAIL POLISH, HAIR PINS, BODY LOTION OR SCENTS.     Please understand that rings and body piercings should be removed prior to surgery.   If they are not removed your surgery is at risk of being delayed or cancelled.    Please do not bring valuables/electronics/cash with you on the day of surgery.  [facility] is not responsible for loss/damage/theft of these items.  Thank you.   If wearing eyeglasses, please bring a case.  DO NOT WEAR CONTACT LENSES.   You may shower, brush your teeth, and  use deodorant.   If you have been instructed, please perform your skin prep as directed.    Please bring any medical supplies or equipment requested by your surgeon (Example: Back/neck brace).   Patients are permitted 2 visitors at a time.   All visitors must be 33 yrs of age or older.    MEDICATIONS: DAY OF SURGERY     Take your medications with a as directed on the medication list.   Anxiety and pain medications may be taken as prescribed at any time prior to arrival.    Bring your inhalers and eye drops on the day of surgery to be used as prescribed.   Medications from the [place] will be administered to you under the direction of your surgeon. Please leave your prescriptions at home.    AT [place]  [facility] in the [employer]  Enter the building through the [employer].  Please stop at the Information Desk so that they may direct you to Surgery [facility] on [address].   Leave your belongings in the car (except for your CPAP) and your visitors may bring them to your room after surgery.    [place]    As a convenience, prior to your discharge we will provide any discharge medications you require.     The pharmacy is open from 9am to 5:30pm on weekdays and 10am-2pm on Saturdays.      If you will be alone on the day of surgery and want to leave with your prescribed medication, you may:     Bring a check made out to [facility]   Use a credit card to pay for your prescription   Have your family call the pharmacy with a credit card number   Only bring cash to the hospital as a last resort. Prescriptions cannot be filled without payment. Thank you for your consideration.    QUESTIONS?     Question about these instructions? Call [phone], select option 2, and leave a message for a nurse to return your call.   Any questions regarding specifics about your surgery or recovery? Please call your surgeons office.

## 2017-02-27 NOTE — Anesthesia Preprocedure Evaluation (Addendum)
Anesthesia Pre-operative History and Physical for [name]    Highlighted Issues for this Procedure:  67 y.o. female with Atypical hyperplasia of left breast (N62) presenting for Procedure(s):  LEFT BREAST BIOPSY WITH NEEDLE LOCALIZATION  WIRE@7 :30 by Surgeon(s):  [name], MD scheduled for 90 minutes.    Past Medical History:  No date: Mitral valve prolapse  No date: Thyroid disease    No Known Allergies (drug, envir, food or latex)    Weight = 76 kg       .  Anesthesia Evaluation Information Source: patient, records     ANESTHESIA HISTORY  Pertinent(-):  No History of anesthetic complications or Family hx of anesthetic complications    GENERAL     Denies general issues    HEENT     Denies HEENT issues PULMONARY     Denies pulmonary issues  Pertinent(-):  No smoking, asthma, recent URI, sleep apnea or COPD    CARDIOVASCULAR  Good(4+METs) Exercise Tolerance  Pertinent(-):  No hypertension    GI/HEPATIC/RENAL  Last PO Intake: >8hr before procedure and >2hr before procedure (clears)  Pertinent(-):  No GERD, liver  issues or renal issues NEURO/PSYCH  Pertinent(-):  No headaches, chronic pain, neuropsychiatric issues, seizures, cerebrovascular event or neuromuscular disease    ENDO/OTHER    + Thyroid Disease          HYPOthyroid well controlled  Pertinent(-):  No diabetes mellitus    HEMATOLOGIC     Denies hematologic issues       Physical Exam    Airway            Mouth opening: limited            Mallampati: III            TM distance (fb): <3 FB            Neck ROM: full            Airway Impression: challenging  Dental   Normal Exam   Cardiovascular           Rhythm: regular           Rate: normal  No murmur         Pulmonary     breath sounds clear to auscultation    Mental Status     oriented to person, place and time         ________________________________________________________________________  PLAN  ASA Score  2  Anesthetic Plan general     Induction (routine IV) General Anesthesia/Sedation  Maintenance Plan (inhaled agents); Airway (LMA- Potentially anterior airway given short TM distance.  If needs intubation will likely need glidescope or lightwand); Line ( use current access); Monitoring (standard ASA); Positioning (supine); PONV Plan (dexamethasone and ondansetron); Pain (nerve block); PostOp (PACU)    Informed Consent     Risks:          Risks discussed were commensurate with the plan listed above with the following specific points: N/V, aspiration, sore throat, infection, failed block, headache and hypotension , damage to:(eyes, nerves, teeth, blood vessels), allergic Rx, unexpected serious injury, awareness, death    Anesthetic Consent:         Anesthetic plan (and risks as noted above) were discussed with patient    Plan also discussed with team members including:       resident    Attending Attestation:  As the primary attending anesthesiologist, I attest that the patient or proxy understands and accepts  the risks and benefits of the anesthesia plan. I also attest that I have personally performed a pre-anesthetic examination and evaluation, and prescribed the anesthetic plan for this particular location within 48 hours prior to the anesthetic as documented. Cleon Gustin, MD 9:55 AM

## 2017-02-28 ENCOUNTER — Other Ambulatory Visit: Payer: Medicare (Managed Care)

## 2017-02-28 ENCOUNTER — Encounter
Admission: RE | Disposition: A | Discharge status: Home / Self Care | Payer: Self-pay | Source: Ambulatory Visit | Attending: Surgical Oncology

## 2017-02-28 ENCOUNTER — Ambulatory Visit
Admission: RE | Admit: 2017-02-28 | Discharge: 2017-02-28 | Disposition: A | Discharge status: Home / Self Care | Payer: Medicare (Managed Care) | Source: Ambulatory Visit | Attending: Surgical Oncology | Admitting: Surgical Oncology

## 2017-02-28 ENCOUNTER — Ambulatory Visit: Payer: Medicare (Managed Care) | Admitting: Anesthesiology

## 2017-02-28 ENCOUNTER — Ambulatory Visit: Payer: Medicare (Managed Care)

## 2017-02-28 ENCOUNTER — Ambulatory Visit: Payer: Medicare (Managed Care) | Admitting: Radiology

## 2017-02-28 DIAGNOSIS — N6092 Unspecified benign mammary dysplasia of left breast: Secondary | ICD-10-CM

## 2017-02-28 DIAGNOSIS — G8918 Other acute postprocedural pain: Secondary | ICD-10-CM

## 2017-02-28 DIAGNOSIS — Z171 Estrogen receptor negative status [ER-]: Secondary | ICD-10-CM | POA: Insufficient documentation

## 2017-02-28 DIAGNOSIS — N6089 Other benign mammary dysplasias of unspecified breast: Secondary | ICD-10-CM

## 2017-02-28 DIAGNOSIS — N6082 Other benign mammary dysplasias of left breast: Secondary | ICD-10-CM

## 2017-02-28 DIAGNOSIS — D0512 Intraductal carcinoma in situ of left breast: Secondary | ICD-10-CM | POA: Insufficient documentation

## 2017-02-28 DIAGNOSIS — N62 Hypertrophy of breast: Secondary | ICD-10-CM | POA: Insufficient documentation

## 2017-02-28 HISTORY — PX: PR EXC BREAST LES PREOP PLMT RAD MARKER OPEN 1 LES: 19125

## 2017-02-28 SURGERY — BIOPSY, BREAST, WITH NEEDLE LOCALIZATION
Anesthesia: General | Site: Breast | Laterality: Left | Wound class: Clean

## 2017-02-28 MED ORDER — LACTATED RINGERS IV SOLN *I*
20.0000 mL/h | INTRAVENOUS | Status: DC
Start: 2017-02-28 — End: 2017-02-28
  Administered 2017-02-28: 20 mL/h via INTRAVENOUS

## 2017-02-28 MED ORDER — FENTANYL CITRATE 50 MCG/ML IJ SOLN *WRAPPED*
INTRAMUSCULAR | Status: DC | PRN
Start: 2017-02-28 — End: 2017-02-28
  Administered 2017-02-28 (×2): 50 ug via INTRAVENOUS

## 2017-02-28 MED ORDER — PROMETHAZINE HCL 25 MG/ML IJ SOLN *I*
6.2500 mg | Freq: Once | INTRAMUSCULAR | Status: DC | PRN
Start: 2017-02-28 — End: 2017-02-28

## 2017-02-28 MED ORDER — MIDAZOLAM HCL 1 MG/ML IJ SOLN *I* WRAPPED
INTRAMUSCULAR | Status: DC | PRN
Start: 2017-02-28 — End: 2017-02-28
  Administered 2017-02-28: 2 mg via INTRAVENOUS

## 2017-02-28 MED ORDER — PHENYLEPHRINE 100 MCG/ML IN NS 10 ML *WRAPPED*
INTRAMUSCULAR | Status: DC | PRN
Start: 2017-02-28 — End: 2017-02-28
  Administered 2017-02-28: 100 ug via INTRAVENOUS

## 2017-02-28 MED ORDER — ONDANSETRON 4 MG PO TBDP *I*
4.0000 mg | ORAL_TABLET | Freq: Once | ORAL | Status: DC | PRN
Start: 2017-02-28 — End: 2017-02-28

## 2017-02-28 MED ORDER — BUPIVACAINE-EPINEPHRINE 0.25 % IJ SOLUTION *WRAPPED*
INTRAMUSCULAR | Status: AC
Start: 2017-02-28 — End: 2017-02-28
  Filled 2017-02-28: qty 50

## 2017-02-28 MED ORDER — FENTANYL CITRATE 50 MCG/ML IJ SOLN *WRAPPED*
INTRAMUSCULAR | Status: AC
Start: 2017-02-28 — End: 2017-02-28
  Filled 2017-02-28: qty 2

## 2017-02-28 MED ORDER — BUPIVACAINE-EPINEPHRINE 0.25 % IJ SOLUTION *WRAPPED*
INTRAMUSCULAR | Status: DC | PRN
Start: 2017-02-28 — End: 2017-02-28
  Administered 2017-02-28: 6 mL via SUBCUTANEOUS

## 2017-02-28 MED ORDER — LACTATED RINGERS IV SOLN *I*
125.0000 mL/h | INTRAVENOUS | Status: DC
Start: 2017-02-28 — End: 2017-02-28
  Administered 2017-02-28: 125 mL/h via INTRAVENOUS

## 2017-02-28 MED ORDER — ACETAMINOPHEN 500 MG PO TABS *I*
1000.0000 mg | ORAL_TABLET | Freq: Once | ORAL | Status: AC
Start: 2017-02-28 — End: 2017-02-28
  Administered 2017-02-28: 1000 mg via ORAL
  Filled 2017-02-28: qty 2

## 2017-02-28 MED ORDER — LACTATED RINGERS IV SOLN *I*
75.0000 mL/h | INTRAVENOUS | Status: DC
Start: 2017-02-28 — End: 2017-02-28

## 2017-02-28 MED ORDER — SODIUM CHLORIDE 0.9 % FLUSH FOR PUMPS *I*
0.0000 mL/h | INTRAVENOUS | Status: DC | PRN
Start: 2017-02-28 — End: 2017-03-01

## 2017-02-28 MED ORDER — ACETAMINOPHEN 325 MG PO TABS *I*
650.0000 mg | ORAL_TABLET | ORAL | Status: DC | PRN
Start: 2017-02-28 — End: 2017-03-01

## 2017-02-28 MED ORDER — CEFAZOLIN 2000 MG IN STERILE WATER 20ML SYRINGE *I*
2000.0000 mg | PREFILLED_SYRINGE | Freq: Once | INTRAVENOUS | Status: AC
Start: 2017-02-28 — End: 2017-02-28
  Administered 2017-02-28: 2000 mg via INTRAVENOUS
  Filled 2017-02-28: qty 20

## 2017-02-28 MED ORDER — LACTATED RINGERS IV SOLN *I*
100.0000 mL/h | INTRAVENOUS | Status: DC
Start: 2017-02-28 — End: 2017-03-01

## 2017-02-28 MED ORDER — NAPROXEN 500 MG PO TABS *I*
500.0000 mg | ORAL_TABLET | Freq: Two times a day (BID) | ORAL | Status: DC
Start: 2017-02-28 — End: 2017-03-08

## 2017-02-28 MED ORDER — CEFAZOLIN 2000 MG IN STERILE WATER 20ML SYRINGE *I*
PREFILLED_SYRINGE | INTRAVENOUS | Status: DC
Start: 2017-02-28 — End: 2017-02-28
  Filled 2017-02-28: qty 20

## 2017-02-28 MED ORDER — MIDAZOLAM HCL 1 MG/ML IJ SOLN *I* WRAPPED
INTRAMUSCULAR | Status: AC
Start: 2017-02-28 — End: 2017-02-28
  Filled 2017-02-28: qty 2

## 2017-02-28 MED ORDER — DEXAMETHASONE SODIUM PHOSPHATE 4 MG/ML INJ SOLN *WRAPPED*
INTRAMUSCULAR | Status: DC | PRN
Start: 2017-02-28 — End: 2017-02-28
  Administered 2017-02-28: 4 mg via INTRAVENOUS

## 2017-02-28 MED ORDER — KETOROLAC TROMETHAMINE 30 MG/ML IJ SOLN *I*
15.0000 mg | Freq: Once | INTRAMUSCULAR | Status: AC
Start: 2017-02-28 — End: 2017-02-28
  Administered 2017-02-28: 15 mg via INTRAVENOUS
  Filled 2017-02-28: qty 1

## 2017-02-28 MED ORDER — DEXTROSE 5 % FLUSH FOR PUMPS *I*
0.0000 mL/h | INTRAVENOUS | Status: DC | PRN
Start: 2017-02-28 — End: 2017-03-01

## 2017-02-28 MED ORDER — PROPOFOL 10 MG/ML IV EMUL (INTERMITTENT DOSING) WRAPPED *I*
INTRAVENOUS | Status: DC | PRN
Start: 2017-02-28 — End: 2017-02-28
  Administered 2017-02-28: 150 mg via INTRAVENOUS

## 2017-02-28 MED ORDER — LIDOCAINE HCL 2 % IJ SOLN *I*
INTRAMUSCULAR | Status: DC | PRN
Start: 2017-02-28 — End: 2017-02-28
  Administered 2017-02-28: 60 mg via INTRAVENOUS

## 2017-02-28 MED ORDER — ASPIRIN 325 MG PO TBEC
325.0000 mg | DELAYED_RELEASE_TABLET | Freq: Every day | ORAL | 0 refills | Status: DC
Start: 2017-02-28 — End: 2019-05-04

## 2017-02-28 MED ORDER — BUPIVACAINE-EPINEPHRINE 0.25 % IJ SOLUTION *WRAPPED*
INTRAMUSCULAR | Status: DC | PRN
Start: 2017-02-28 — End: 2017-02-28
  Administered 2017-02-28 (×2): 15 mL via PERINEURAL

## 2017-02-28 MED ORDER — HYDROCODONE-ACETAMINOPHEN 5-325 MG PO TABS *I*
1.0000 | ORAL_TABLET | Freq: Four times a day (QID) | ORAL | Status: DC | PRN
Start: 2017-02-28 — End: 2017-03-01
  Administered 2017-02-28: 1 via ORAL
  Filled 2017-02-28: qty 1

## 2017-02-28 MED ORDER — HYDROCODONE-ACETAMINOPHEN 5-325 MG PO TABS *I*
1.0000 | ORAL_TABLET | Freq: Four times a day (QID) | ORAL | 0 refills | Status: DC | PRN
Start: 2017-02-28 — End: 2017-03-18

## 2017-02-28 MED ORDER — ONDANSETRON HCL 2 MG/ML IV SOLN *I*
INTRAMUSCULAR | Status: DC | PRN
Start: 2017-02-28 — End: 2017-02-28
  Administered 2017-02-28: 4 mg via INTRAVENOUS

## 2017-02-28 MED ORDER — HALOPERIDOL LACTATE 5 MG/ML IJ SOLN *I*
0.5000 mg | Freq: Once | INTRAMUSCULAR | Status: DC | PRN
Start: 2017-02-28 — End: 2017-02-28

## 2017-02-28 MED ORDER — SODIUM CHLORIDE 0.9 % IV SOLN WRAPPED *I*
20.0000 mL/h | Status: DC
Start: 2017-02-28 — End: 2017-03-01

## 2017-02-28 MED ORDER — LIDOCAINE HCL (PF) 1 % IJ SOLN *I*
0.1000 mL | INTRAMUSCULAR | Status: DC | PRN
Start: 2017-02-28 — End: 2017-02-28
  Administered 2017-02-28: 0.1 mL via SUBCUTANEOUS
  Filled 2017-02-28: qty 2

## 2017-02-28 SURGICAL SUPPLY — 27 items
APPLIER CLIP MED SHORT 9-3/8IN (Supply) IMPLANT
BANDAGE ELAST SLF-CLSR 6X11 LF NONSTER (Dressing) ×3 IMPLANT
BLANKET LOWER BODY TEMP THERAPY (Drape) IMPLANT
CLIP LIGATING TI HORIZON WIDE SLOT RED SM (Supply) ×3 IMPLANT
DRAPE BREAST/CHEST 15X10 FENESTRATED (Drape) ×3 IMPLANT
DRAPE SHEET 40X70 MED (Drape) ×3 IMPLANT
DRAPE SHEET 53X77 (Drape) ×2
DRAPE SUR 3 QTR W53XL77IN SMS POLYPR ~~LOC~~ (Drape) ×1 IMPLANT
FILTER NEPTUNE 4PORT MANIFOLD (Supply) ×3 IMPLANT
GLOVE SURG PROTEXIS PI 7.0 PF SYN (Glove) ×12 IMPLANT
GLOVE SURG PROTEXIS SZ 6.5 PF LTX (Glove) ×11 IMPLANT
GOWN SIRUS NONREINFORCED SMALL (Other) ×3 IMPLANT
NEEDLE HYPO LF 22G X 1.5IN BLACK (Needle) ×3 IMPLANT
PACK CUSTOM GENERAL PACK (Pack) ×3 IMPLANT
SLEEVE COMP KNEE HI MED (Supply) ×3 IMPLANT
SOL CHG SCRUB 4 OZ (Other) ×2
SOLUTION SUR PREP 4OZ 4% CHG BTL EXIDINE (Other) ×1 IMPLANT
SUTR MONOCRYL PLUS 4-0 18PS2 (Suture) ×3 IMPLANT
SUTR SILK 2-0 SH 30 IN BLACK (Suture) ×3 IMPLANT
SUTR VICRYL ANTIB 3-0 SH 18 VIOLET (Suture) IMPLANT
SUTR VICRYL ANTIB 3-0 SH 27 UNDY (Suture) ×3 IMPLANT
SUTR VICRYL CTD 2-0 SUTUPAK VIOLET (Suture) IMPLANT
SUTR VICRYL CTD 3-0 VIL LIGAPAK VIOLET (Suture) IMPLANT
SYRINGE IRRIG BULB 50CC (Syringe) ×3 IMPLANT
TIP YANKAUER SUCT BULBOUS ON/OFF LF (Supply) ×3 IMPLANT
TRAY PATIENT PREP (Tray) ×2
TRAY PREP SKIN INCL 8 DRY GZ PD TWO 6IN COT TIP APPL 2 STK SPNG (Tray) ×1 IMPLANT

## 2017-02-28 NOTE — INTERIM OP NOTE (Signed)
Interim Op Note (Surgical Log ID: [id number])       Date of Surgery: 02/28/2017       Surgeons: Surgeon(s) and Role:     * [name], MD - Primary       Pre-op Diagnosis: Pre-Op Diagnosis Codes:     * Atypical hyperplasia of left breast [N62]       Post-op Diagnosis: Post-Op Diagnosis Codes:     * Atypical hyperplasia of left breast [N62]       Procedure(s) Performed: Procedure:    LEFT BREAST BIOPSY WITH NEEDLE LOCALIZATION    CPT(R) Code:  03546 - PR EXCISE BREAST LES W XRAY MARKER         Additional CPT Codes:        Anesthesia Type: General        Fluid Totals: I/O this shift:  01/17 0700 - 01/17 1459  In: 1000 (13.1 mL/kg) [I.V.:1000]  Out: 5 (0.1 mL/kg) [Blood:5]  Net: 995  Weight: 76.2 kg        Estimated Blood Loss: 5 mL       Specimens to Pathology:  ID Type Source Tests Collected by Time Destination   A : LEFT BREAST TISSUE. SHORT STITCH SUPERIOR, LONG STITCH LATERAL, DOUBLE DEEP TISSUE Breast SURGICAL PATHOLOGY Linward Foster, RN 02/28/2017 1053           Temporary Implants:        Packing:                 Patient Condition: good       Findings (Including unexpected complications):      Signed:  Clemencia Course, PA  on 02/28/2017 at 11:22 AM

## 2017-02-28 NOTE — Interval H&P Note (Signed)
UPDATES TO PATIENT'S CONDITION on the DAY OF SURGERY/PROCEDURE    I. Updates to Patient's Condition (to be completed by a provider privileged to complete a H&P, following reassessment of the patient by the provider):    Day of Surgery/Procedure Update:  History  History reviewed and no change    Physical  Physical exam updated and no change            II. Procedure Readiness   I have reviewed the patient's H&P and updated condition. By completing and signing this form, I attest that this patient is ready for surgery/procedure.    III. Attestation   I have reviewed the updated information regarding the patient's condition and it is appropriate to proceed with the planned surgery/procedure.    Mariana Wiederholt Ubaldo Glassing, MD as of 10:01 AM 02/28/2017

## 2017-02-28 NOTE — H&P (View-Only) (Signed)
[name]  [id number]    Referring Physician: No referring provider defined for this encounter.    Chief Complaint: The patient is referred for consultation regarding management of her atypical hyperplasia left breast.  HPI: [name] is a pleasant 67 y.o. White female seen on 02/15/2017 for consultation at [place] at [facility].  She was unaware of any problems with her breasts when she presented for a routine screening mammogram.  A 2.5 cm area of microcalcifications was identified in the left breast prompting core needle biopsy.  That showed atypical hyperplasia.  She denies lumps, masses, nipple retraction, nipple discharge, or skin changes.  She has a history of breast cysts but his never had any breast surgery or biopsies.  She began getting mammograms around the age of 89 and performs monthly self examination.  She is otherwise in reasonably good health but she is quite emotional today as she is having some difficulties with her son.  I offered her social work assistance but she declined.    Gynecologic History:  She had her first menstrual period around the age of 30 and her last around the age of 67.  She went through natural menopause.  She used an IUD for birth control in the past but did not use hormonal preparations.  She never had fertility treatments and did not use hormone replacement therapy.  She had 3 pregnancies and 3 live births.  Her first pregnancy was at the age of 71 and the last at the age of 55.  She breast-fed for a total of 9 months.    Medical History:   Past Medical History:   Diagnosis Date    Mitral valve prolapse     Thyroid disease          Medications:   Current Outpatient Prescriptions   Medication Sig    levothyroxine (SYNTHROID, LEVOTHROID) 100 MCG tablet Take 112 mcg by mouth daily (before breakfast)       aspirin 325 MG EC tablet Take 1 tablet (325 mg total) by mouth daily    naproxen (NAPROSYN) 500 MG tablet Take 500 mg by mouth 2 times daily (with meals)      No current facility-administered medications for this visit.        Allergies: has No Known Allergies (drug, envir, food or latex).    Surgical history:   Past Surgical History:   Procedure Laterality Date    KNEE SURGERY Left     TONSILLECTOMY         Social:   Social History     Social History    Marital status: Married     Spouse name: N/A    Number of children: N/A    Years of education: N/A     Occupational History    Not on file.     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 Pertinent items are noted in HPI.      Physical Exam:  Vitals: BP 133/74    Pulse 92    Temp 36.7 C (98.1 F) (Temporal)    Ht 155.9 cm (5' 1.38")    Wt 76.2 kg (167 lb 15.9 oz)    SpO2 98%    BMI 31.35 kg/m   General appearance: alert, appears stated age and cooperative  Head: Normocephalic, without obvious abnormality, atraumatic  Eyes: conjunctivae/corneas clear. PERRL, EOM's intact.  Ears: External ears are normal, hearing is grossly intact.  Nose: Nares normal and patent.  Throat: lips, mucosa, and tongue normal; teeth and gums normal  Neck: no adenopathy, supple, symmetrical, trachea  midline and thyroid not enlarged, symmetric, no tenderness/mass/nodules  Back: symmetric, no curvature. ROM normal. No CVA tenderness.  Lungs: clear to auscultation bilaterally  Heart: regular rate and rhythm, S1, S2 normal, no murmur, click, rub or gallop  Abdomen: Soft, nontender, nondistended, no organomegaly, no palpable masses.  Extremities: extremities normal, atraumatic, no cyanosis or edema  Pulses: 2+ and symmetric  Skin: Skin color, texture, turgor normal. No rashes or lesions  Lymph nodes: Cervical, supraclavicular, and axillary nodes normal.  Neurologic: Grossly normal  Breasts: Moderate to large size and ptotic.  No skin changes on either side.  Nipples normally everted without discharge.  No palpable abnormalities of the fibroglandular tissue on either side.    Imaging:     All breast imaging studies were personally reviewed by [place] and findings are per the HPI.    Pathology:   All pathology reports were personally reviewed by [place] and findings are per the HPI. Slides have been requested for our review.      Impression/Plan:  This is a 67 year old with a new diagnosis of atypical hyperplasia of the left breast.  We discussed the diagnosis in detail.  She understands that there is about a 10% incidence of associated cancer 1 surgical biopsies are done for this diagnosis.  She further understands that it represents a risk factor for breast cancer in both breasts not simply at the site of disease.  She also understands that it can progress to breast cancer if left untreated.  We discussed surgical excision including wire localization as well as the expected postoperative course.  She is agreeable to proceed.  We discussed the risks and complications including bleeding, infection, wound problems, fluid collections, and the need for further surgery.  She signed consent today and preoperative teaching was completed.  I'll schedule surgery at her earliest possible convenience.

## 2017-02-28 NOTE — Discharge Instructions (Signed)
[facility] Discharge Instructions      DATE: 02/28/2017     Procedure: left breat biopsy with needle localization              Physician: Dr Lysle Pearl have received sedative medication and/or general anesthesia which may     make you drowsy for as long as 24 hours.       A.) DO NOT drive or operate any machinery for 24 hours    B.) DO NOT drink alcoholic beverages for 24 hours    C.) DO NOT make major decisions, sign contracts, etc. for 24 hours    Diet:  Resume your previous diet   ACTIVITY:  Use ice pack on off today for 20 minutes      CARE OF DRESSING OR INCISION:  Keep incision dry   BATHING/SHOWERING  May shower tomorrow   OTHER:     SEE MEDICATION LIST.      FOLLOW-UP CARE  {followup:[id number]}    Call your doctor for the following problems: FEVER over 101 F/38.4 C; PAIN not relieved with pain medication ordered; SWELLING, REDNESS at incision site, heavy BLEEDING, foul DRAINAGE, INABILITY TO URINATE. Other:     If you are unable to contact your doctor please call or go to [facility] Emergency Department [phone]  If you have any problems in regards to your anesthesia please call (days) [phone], have the operator page an anesthesiologist on call.

## 2017-02-28 NOTE — Op Note (Signed)
[name], [name]  MR #:  [id number]   CSN:  [id number] DOB:  14-May-1950    AGE:  67     SURGEON:  Cleotis Nipper, MD  CO-SURGEON:    ASSISTANTClemencia Course, PA  SURGERY DATE:  02/28/2017    PREOPERATIVE DIAGNOSIS:  Atypical hyperplasia, left breast.    POSTOPERATIVE DIAGNOSIS:  Atypical hyperplasia, left breast.    OPERATIVE PROCEDURE:  Wire localized excisional biopsy, left breast.    ANESTHESIA:  General by LMA with pectoral block by Dr. Clyde Canterbury.    ESTIMATED BLOOD LOSS:  Less than 3 cc.    SPECIMENS:  Left breast tissue.    COMPLICATIONS:  None.    BRIEF HISTORY:  The patient is a 67 year old female who had an abnormal mammogram.  A core needle biopsy showed atypical hyperplasia, so excisional biopsy was recommended.  A localizing wire was placed at [facility] immediately prior to surgery.  The patient marked the site herself and was confirmed by me prior to proceeding to the operating room.  Consent was obtained and on the chart at the time of surgery.    DESCRIPTION OF PROCEDURE:  The patient was brought to the operating room and placed in the supine position on the operating table with the arms extended.  After adequate induction of anesthesia, the localizing wire present at the extreme lateral aspect of the left breast was trimmed to a suitable length.  Blocks were placed by anesthesia and then the breast prepped and draped in the usual sterile fashion.  A radial incision was created at about the 5 o'clock position, after infiltration of local anesthetics.  Skin flaps were then raised in all directions and the localizing wire brought to lie within the wound.  The breast tissues surrounding the localizing wire were incised in all dimensions to free the specimen.  The posterior extent of dissection was the chest wall in its entirety.  This specimen was labeled short stitch superior, long stitch lateral, double stitch deep and a specimen radiograph obtained.  The clip placed at  the time of core biopsy and numerous calcifications were contained within the specimen.  Hemostasis was then assured using electrocautery and the cavity irrigated.  The deep dermal tissues were closed with interrupted Vicryl suture, and the skin closed with running 4-0 Monocryl.  The breast was then cleansed and Dermabond applied.  The patient tolerated the procedure well and was brought to the recovery room in good condition.  Sponge, instrument, and needle counts were correct x2 at the end.             ______________________________  Cleotis Nipper, MD    AO/MODL  DD:  02/28/2017 11:11:10  DT:  02/28/2017 12:29:22  Job #:  [id number]    cc:

## 2017-02-28 NOTE — Anesthesia Procedure Notes (Addendum)
----------------------------------------------------------------------------------------------------------------------------------------    Intercostal Nerve Block      Date of Procedure: 02/28/2017 10:57 AM    Laterality:  Left    Injection Technique: Single-shot   Location Details: Pec 3/Serratus ()    Reason for Block: Post-op pain management and At Surgeon's request        Patient Location: OR  CONSENT AND TIMEOUT     Consent:  Obtained per policy    Timeout: patient identified (name/DOB) , proper patient position verified, needed equipment, monitors, medications and access verified as present and functioning , allergies reviewed with patient/record  and anticoagulation/antiplatelet status reviewed  METHOD     Patient Position:  Supine    Monitoring:  Blood pressure, Continuous pulse ox with supplemental oxygen and EKG    Sedation Used:  Not needed    Level of Sedation: General Anesthesia              for meds injected see MAR portion of chart    Prep:  Aseptic technique per protocol and Chloraprep    Approach:  Out-of-plane and Deep Plane    Technique: Ultrasound guided       Attempts:  1   NEEDLE     Type:  Short-bevel     Gauge: 22 G     Length: 8 cm  BLOCK EVENTS      No Paresthesia with needle     No Paresthesia with injection     No significant resistance to injection     No significant pain on injection     No blood aspirated     No intravascular injection     Well Tolerated  STAFF     Performed by: Extender under direct supervision    Attending Attestation: I was present for the entire procedure     Attending: Clyde Canterbury, Shawnna Pancake  Extender: PILIDIS, Lovena Le  ----------------------------------------------------------------------------------------------------------------------------------------

## 2017-02-28 NOTE — Anesthesia Procedure Notes (Signed)
---------------------------------------------------------------------------------------------------------------------------------------    AIRWAY   GENERAL INFORMATION AND STAFF    Patient location during procedure: OR       Date of Procedure: 02/28/2017 10:42 AM  CONDITION PRIOR TO MANIPULATION     Current Airway/Neck Condition:  Normal        For more airway physical exam details, see Anesthesia PreOp Evaluation  AIRWAY METHOD     Patient Position:  Sniffing    Preoxygenated: yes      Induction: IV and Routine  Mask Difficulty Assessment:  0 - not attempted    Number of Attempts at Approach:  1  FINAL AIRWAY DETAILS    Final Airway Type:  LMA    Final LMA: Unique    LMA Size: 4  ----------------------------------------------------------------------------------------------------------------------------------------

## 2017-02-28 NOTE — Anesthesia Postprocedure Evaluation (Signed)
Anesthesia Post-Op Note    Patient: [name]    Procedure(s) Performed:  Procedure Summary  Date:  02/28/2017 Anesthesia Start: 02/28/2017 10:16 AM Anesthesia Stop: 02/28/2017 11:33 AM Room / Location:  [id number] / [place]   Procedure(s):  LEFT BREAST BIOPSY WITH NEEDLE LOCALIZATION   Diagnosis:  Atypical hyperplasia of left breast [N62] Surgeon(s):  [name], MD Attending Anesthesiologist:  Cleon Gustin, MD         Recovery Vitals  BP: 111/65 (02/28/2017 12:36 PM)  Heart Rate: 90 (02/28/2017 12:36 PM)  Heart Rate (via Pulse Ox): 74 (02/28/2017 11:45 AM)  Resp: 16 (02/28/2017 12:36 PM)  Temp: 36.6 C (97.9 F) (02/28/2017 12:36 PM)  SpO2: 97 % (02/28/2017 12:36 PM)   0-10 Scale: 4 (02/28/2017 12:46 PM)  Anesthesia type:  General  Complications Noted During Procedure or in PACU:  None   Comment:    Patient Location:  ASC  Level of Consciousness:    Recovered to baseline  Patient Participation:     Able to participate  Temperature Status:    Normothermic  Oxygen Saturation:    Within patient's normal range  Cardiac Status:   Within patient's normal range  Fluid Status:    Stable  Airway Patency:     Yes  Pulmonary Status:    Baseline  Pain Management:    Adequate analgesia  Nausea and Vomiting:  None    Post Op Assessment:    Tolerated procedure well   Attending Attestation:  All indicated post anesthesia care provided     -

## 2017-02-28 NOTE — Anesthesia Procedure Notes (Addendum)
----------------------------------------------------------------------------------------------------------------------------------------    Distal Branch Nerve Block      Date of Procedure: 02/28/2017 10:44 AM    Laterality:  Left    Injection Technique: Single-shot   Location Details:[facility] ()    Reason for Block: Post-op pain management and At Surgeon's request        Patient Location: OR  CONSENT AND TIMEOUT     Consent:  Obtained per policy    Timeout: patient identified (name/DOB) , proper patient position verified, needed equipment, monitors, medications and access verified as present and functioning , allergies reviewed with patient/record  and anticoagulation/antiplatelet status reviewed  METHOD     Patient Position:  Supine    Monitoring:  Blood pressure, Continuous pulse ox with supplemental oxygen and EKG    Sedation Used:  Not needed    Level of Sedation: General Anesthesia              for meds injected see MAR portion of chart    Prep:  Aseptic technique per protocol and Chloraprep    Approach:  In-plane and Lateral    Technique: Ultrasound guided       Attempts:  1   NEEDLE     Type:  Short-bevel     Gauge: 22 G     Length: 8 cm  BLOCK EVENTS      No Paresthesia with needle     No Paresthesia with injection     No significant resistance to injection     No significant pain on injection     No blood aspirated     No intravascular injection     Well Tolerated  STAFF     Performed by: Extender under direct supervision    Attending Attestation: I was present for the entire procedure     Attending: Clyde Canterbury, Jaquasia Doscher  Extender: Waldron, Lovena Le  ----------------------------------------------------------------------------------------------------------------------------------------

## 2017-02-28 NOTE — Progress Notes (Signed)
Patient medicated for pain with acetaminophen and ketorolac per orders. Now rates pain at 4/10 left breast, no further intervention required. Stable. No other complaints. Meets PACU discharge criteria for transfer to SDSU.

## 2017-02-28 NOTE — Anesthesia Case Conclusion (Signed)
CASE CONCLUSION  Emergence  Actions:  LMA removed  Assessment:  Routine  Transport  Directly to: PACU  Position:  Upright  Patient Condition on Handoff  Level of Consciousness:  Alert/talking/calm  Patient Condition:  Stable  Handoff Report to:  RN

## 2017-03-01 ENCOUNTER — Encounter: Payer: Self-pay | Admitting: Surgical Oncology

## 2017-03-06 NOTE — Progress Notes (Signed)
Subjective:      [name] presents at [facility] 8 days following left excisional breast biopsy. She is without complaints.     Objective:     Vitals: BP 137/69    Pulse 107    Temp 36.8 C (98.2 F) (Temporal)    Wt 76 kg (167 lb 8.8 oz)    SpO2 100%    BMI 31.27 kg/m     General:  alert, appears stated age and cooperative   Breast Incision: healing well, no significant drainage, no dehiscence     Pathology: Cancer Staging  Ductal carcinoma in situ (DCIS) of left breast  Staging form: Breast, AJCC 8th Edition  - Clinical stage from 02/28/2017: Stage Unknown (cTis (DCIS), cNX, cM0, ER: Positive, PR: Positive, HER2: Not assessed )   : Tumor size: 64m.  ER: 99%, strong staining; PR: 99%, strong staining;  cribiform and micropapillary type, NG1 with focal, punctate necrosis; closest margin: posterior (<130m, lateral (25m56mand medial (<25mm24m   Assessment:     8 days s/p left excisional breast biopsy showing DCIS.  Doing well postoperatively.     Plan:      Continue any current medications.   Wound care discussed.    Pathology reviewed. She will require further surgery. Considering mastectomy. Would like referral to plastic surgery to discuss reconstructive options.    Return after consult with PRS for further surgical planning.     CrysOralia Manis

## 2017-03-07 LAB — SURGICAL PATHOLOGY

## 2017-03-08 ENCOUNTER — Ambulatory Visit: Payer: Medicare (Managed Care) | Admitting: Surgery

## 2017-03-08 ENCOUNTER — Telehealth: Payer: Self-pay | Admitting: Surgical Oncology

## 2017-03-08 VITALS — BP 137/69 | HR 107 | Temp 98.2°F | Wt 167.5 lb

## 2017-03-08 DIAGNOSIS — D0512 Intraductal carcinoma in situ of left breast: Secondary | ICD-10-CM

## 2017-03-08 DIAGNOSIS — Z853 Personal history of malignant neoplasm of breast: Secondary | ICD-10-CM | POA: Insufficient documentation

## 2017-03-08 DIAGNOSIS — N62 Hypertrophy of breast: Secondary | ICD-10-CM

## 2017-03-08 DIAGNOSIS — N6092 Unspecified benign mammary dysplasia of left breast: Secondary | ICD-10-CM

## 2017-03-08 NOTE — Telephone Encounter (Signed)
Left vm for pt to cb to confirm her apt for 2.4

## 2017-03-08 NOTE — Telephone Encounter (Signed)
-----   Message from [name], NP sent at 03/08/2017 11:52 AM EST -----  2/4 2pm  ----- Message -----  From: Marcina Millard  Sent: 03/08/2017  10:52 AM  To: Virl Son Cc Dr. Ivor Costa Team Pool    Please advise where to put pt for fua after imaging im looking at 2.1 if possible to add her on at 12:15p  Thank you

## 2017-03-11 ENCOUNTER — Encounter: Payer: Self-pay | Admitting: Surgery

## 2017-03-11 ENCOUNTER — Ambulatory Visit: Payer: Medicare (Managed Care) | Attending: Surgical Oncology | Admitting: Surgery

## 2017-03-11 VITALS — BP 139/78 | HR 93 | Temp 97.4°F | Resp 16 | Ht 61.0 in | Wt 166.0 lb

## 2017-03-11 DIAGNOSIS — Z7189 Other specified counseling: Secondary | ICD-10-CM

## 2017-03-11 DIAGNOSIS — D0512 Intraductal carcinoma in situ of left breast: Secondary | ICD-10-CM

## 2017-03-12 ENCOUNTER — Encounter: Payer: Self-pay | Admitting: Surgery

## 2017-03-12 NOTE — H&P (Signed)
Dear Dr. Freda Munro,    Thank you for referring [name] for discussion of breast reconstruction options. As you know, [name] is a 67 y.o. woman with biopsy proven ductal carcinoma in situ of the left breast, s/p needle localized left breast lumpectomy which spans 6 cm.  She has decided to proceed with bilateral mastectomy.  Her current bra size is 38B.  She would like to be restored to the same size if not slightly larger.      Past Medical History:   Diagnosis Date    Anxiety     Arthritis     Depression     Ductal carcinoma in situ (DCIS) of left breast     Hypothyroidism     Mitral valve prolapse      Past Surgical History:   Procedure Laterality Date    KNEE CARTILAGE SURGERY Left 2014    KNEE SURGERY Left     PR EXCISE BREAST LES W XRAY MARKER Left 02/28/2017    Procedure: LEFT BREAST BIOPSY WITH NEEDLE LOCALIZATION  ;  Surgeon: [name], MD;  Location: [facility] MAIN OR;  Service: Oncology General    TONSILLECTOMY       Social History   Substance Use Topics    Smoking status: Never Smoker    Smokeless tobacco: Never Used    Alcohol use 4.2 oz/week     7 Glasses of wine per week     Family History   Problem Relation Age of Onset    Lung cancer Father     Cancer Father         Lung    Breast cancer Sister         half sister    Cancer Sister 36        breast    Rectal cancer Brother         half brother    Alzheimer's disease Mother     High Blood Pressure Mother     No Known Problems Son     Thyroid disease Sister     Cancer Sister         Breast    Cancer Brother         Prostate    COPD Brother     No Known Problems Son     No Known Problems Son     Diabetes Neg Hx        Current Outpatient Prescriptions:     aspirin 325 MG EC tablet, Take 1 tablet (325 mg total) by mouth daily, Disp: 30 tablet, Rfl: 0    levothyroxine (SYNTHROID, LEVOTHROID) 100 MCG tablet, Take 112 mcg by mouth daily (before breakfast)   , Disp: , Rfl:     HYDROcodone-acetaminophen (NORCO) 5-325 MG  per tablet, Take 1 tablet by mouth every 6 hours as needed for Pain   Max daily dose: 4 tablets, Disp: 8 tablet, Rfl: 0    No Known Allergies (drug, envir, food or latex)      Review of Systems:  A comprehensive review of systems was negative except for: Constitutional: positive for sleep disturbances  Eyes: positive for contacts/glasses  Behavioral/Psych: positive for anxiety and depression    Physical Exam:  BP 139/78    Pulse 93    Temp 36.3 C (97.4 F) (Temporal)    Resp 16    Ht 1.549 m (5\' 1" )    Wt 75.3 kg (166 lb)    SpO2 97%    BMI 31.37  kg/m     General Appearance:    Alert, cooperative, no distress, appears stated age   Head:   Normocephalic, atraumatic.     Eyes:    PERRL, conjunctiva/corneas clear, EOM's intact, both eyes   Nose:   Nares normal, no drainage    Throat:   Lips, mucosa, and tongue normal; teeth and gums normal   Neck:   Supple, symmetrical, trachea midline, no adenopathy;     thyroid:  no enlargement/tenderness/nodules; no JVD   Back:     Latissimus intact bilaterally. Small amount of overlying soft tissue available.     Lungs:     Respirations full and unlabored   Chest Wall:    No tenderness or deformity   Breast Exam:    Incision to left breast C/D/I.  Breasts small/moderate in size.  BW 16 cm bilaterally.  No palpable mass, cutaneous lesions or nipple abnormality.   Abdomen:    Soft, non-tender, no masses, small amount of soft tissue available along lower abdomen.     Pulses:   2+ and symmetric all extremities   Skin:   Skin color, texture, turgor normal, no rashes or lesions   Lymph nodes:   Cervical, supraclavicular, and axillary nodes normal   Neurologic:   CNII-XII grossly intact.  Gait stable.       IMPRESSION/PLAN:  [name] is a 67 y.o. diagnosed with ductal carcinoma in situ of the left breast, pTIS(DCIS).  Electing bilateral mastectomy and immediate reconstruction.    Dr. Marland Mcalpine and I enjoyed a 60 minute consultation with [name], 95% of which was counseling and  coordination of care.  We reviewed all available options in breast reconstruction including implants, autologous tissue and a combination of the two. We also covered timing of reconstruction, including delayed, immediate, and immediate staged. We discussed the most common operations performed for breast reconstruction and their indications, risks/benefits and postoperative course/recovery.      We reviewed the role of acellular dermal matrix (Alloderm) in the setting of implant based breast reconstruction.  We discussed the expected surgical time, hospital stay and recovery period associated with implant and autologous reconstructions.  She understands that most patients require a second outpatient surgery for either implant exchange or sculpting of original reconstruction.  We discussed the role of drains and she understands that they can be in place for several days up to 3 weeks.  We reviewed the potential effects radiation therapy can have on all types of reconstruction.  She understands that should she choose the route of lumpectomy/adjuvant radiation the option for an implant based reconstruction alone is off the table should she develop a recurrence and elect to have mastectomy/reconstruction.     At the conclusion of our discussion, the patient was most interested in an implant based breast reconstruction.  We provided her with an informational pamphlet from the [employer], as well as online resources on breast reconstruction, so that she can review this information on her own.  All of her questions and concerns were addressed to her satisfaction.  She will contact our office once she has made a decision and should elect to undergo mastectomy our office will coordinate a surgical date with yours.      We will have her return to clinic the day before her scheduled surgery at which time we will address any additional questions that she has, obtain consent and provide surgical  markings.       We thank  you kindly for your referral Dr. Bennie Hind, and appreciate the opportunity to participate in [name]'s care.      Bernadette Hoit, [place]  03/12/2017

## 2017-03-18 ENCOUNTER — Encounter: Payer: Self-pay | Admitting: Surgical Oncology

## 2017-03-18 ENCOUNTER — Encounter: Payer: Self-pay | Admitting: Gastroenterology

## 2017-03-18 ENCOUNTER — Ambulatory Visit: Payer: Medicare (Managed Care) | Attending: Surgical Oncology | Admitting: Surgical Oncology

## 2017-03-18 VITALS — BP 132/79 | HR 102 | Temp 97.9°F | Resp 18 | Ht 61.38 in | Wt 167.1 lb

## 2017-03-18 DIAGNOSIS — D0512 Intraductal carcinoma in situ of left breast: Secondary | ICD-10-CM

## 2017-03-18 NOTE — Patient Instructions (Signed)
Pre-Operative Instructions [facility]   [place], [place] [address]             Your surgery is scheduled for __2/14/19_________.    PRIOR TO SURGERY    Five days before surgery, please STOP taking if surgeon does not specify:     Anti-inflammatory medications (Ibuprofen, Motrin, Advil, Mobic, Meloxicam, Aleve, Naproxen, Voltaren, etc.)   Vitamins and herbal supplements, including herbal teas    YOU MAY TAKE ACETAMINOPHEN (TYLENOL) as needed    FOLLOW YOUR SURGEON'S INSTRUCTIONS IF DIFFERENT THAN ABOVE.    DAY BEFORE SURGERY    Staff from [facility] will call you by 6pm the evening before the surgery to inform you of your arrival time.  The call will be Friday afternoon if your surgery is on a Monday. If you have not received a phone call by 6pm, please phone [phone] and ask to be transferred to the [facility].  Please arrange for transportation to and from the hospital. You must have a responsible adult to stay with you after your surgery.  Do not eat anything after midnight the night before your surgery.   Clear liquids are encouraged from midnight until 2 hours before your scheduled surgery. Examples include: Gatorade, clear apple or cranberry juice, water, soda, black coffee or tea.  If you have been instructed, please perform your skin prep as directed.                                                                                         DAY OF SURGERY    Clear liquids only until to 2 hours before your scheduled surgery time.  Please bring photo identification and your insurance card with you for registration.  If you are female, please be prepared to provide a urine sample on arrival to the preoperative area unless you are one year post menopause or have had a hysterectomy.   DO NOT WEAR: JEWELRY, MAKEUP, DARK NAIL POLISH, HAIR PINS, BODY LOTION OR SCENTS.    Please understand that rings and body piercings must be removed prior to surgery. If they  are not removed your surgery is at risk of being delayed or cancelled. Please see a jeweler before your surgery if you cannot remove an item yourself  Please do not bring valuables/electronics/cash with you on the day of surgery.  [facility] is not responsible for loss/damage/theft of these items.  Thank you.  If wearing eyeglasses, please bring a case.    DO NOT WEAR CONTACT LENSES.   You may shower, brush your teeth, and use deodorant.   If you have been instructed, please perform your skin prep as directed.   PLEASE BRING YOUR CPAP MACHINE INTO THE PREOPERATIVE AREA TO BE CHECKED IF YOU ARE STAYING OVERNIGHT, OTHERWISE YOU MAY LEAVE IT IN YOUR VEHICLE.Marland Kitchen   Please bring any medical supplies or equipment requested by your surgeon (Example: Back/neck brace).   Patients are permitted 2 visitors at a time.   All visitors must be 65 yrs of age or older.    MEDICATIONS: DAY  OF SURGERY     Take your medications with a as directed on the medication list.   Anxiety and pain medications may be taken as prescribed at any time prior to arrival.   Bring your inhalers and eye drops on the day of surgery to be used as prescribed.  Medications from the [facility] will be administered to you under the direction of your surgeon. Please leave your prescriptions at home.    DIABETICS: ON THE DAY OF SURGERY     Do not take oral diabetes medications.  DO NOT TAKE ANY REGULAR OR HUMALOG INSULIN (THIS INSTRUCTION DOES NOT APPLY TO INSULIN PUMPS).    Take half the dose of NPH insulin.   Take Lantus or Levemir insulin as usual.   If you are diabetic and feeling symptomatic, you may take sugar containing clear liquids after midnight the night before your surgery if your glucose level is less than 70.  Please limit your intake of these clear liquids to 8 ounces if at all possible.   If you are still feeling symptomatic, you may report to the [facility] early and report your symptoms to anesthesia  personnel.  Please bring backup supplies if you are currently using an insulin pump.  Thank you.      AT [place] in the [address] garage.  Enter the building through the [employer].  Please stop at the Information Desk so that they may direct you to [facility] [address].   Leave your belongings in the car (except for your CPAP) and your visitors may bring them to your room after surgery.    [place]    As a convenience, prior to your discharge we will provide any discharge medications you require.     The pharmacy is open from 9am to 5:30pm on weekdays and 10am-2pm on Saturdays.      If you will be alone on the day of surgery and want to leave with your prescribed medication, you may:             Bring a check made out to [facility]           Use a credit card to pay for your prescription           Have your family call the pharmacy with a credit card number           Only bring cash to the hospital as a last resort. Prescriptions cannot be filled without payment. Thank you for your consideration.      QUESTIONS?     Question about these instructions? Call [phone], select option 2, and leave a message for a nurse to return your call.   Any questions regarding specifics about your surgery or recovery? Please call your surgeons office.      [facility]  Telephone: [place]:    ?If there is a bandage over your drain site(s) it should be changed whenever it gets wet or dirty. If there is no bandage, tissue glue was used to seal the site. There is a stitch securing the drain. This will be removed when the drain is removed. If you develop drainage around the drain, keep a clean dry gauze dressing over the site. Call if you have a lot of drainage. You may shower beginning the day after  your surgery. Do not have the shower directly hit your drain site or soak in a tub  or pool until 2 days after the drain is removed (usually 1-2 weeks after surgery).   ?Avoid jogging, aerobics or other strenuous activity for 2 weeks to allow the breast to heal. Walking or exercising on a stationary bicycle is permitted. Do not do any heavy (anything heavier than a gallon of milk) lifting or vigorous upper body exercise until the drain is removed. Upper body movement can increase the amount of drainage and delay the removal of the drain.  ?Empty the drain at least two (2) times a day. If it gets full more often, empty whenever it gets full. Make note of the amount of drainage every time you empty the drain. Wash your hands before and after emptying the drain. To empty the drain, open the plug and carefully pour the contents into a measuring cup. Squeeze the bulb and close the plug. The drain should look like it does while squeezing it (like it has a dent in it). Record the total daily drainage amount in the chart on the back of this page. Bring this paper to your follow-up appointment. The drain will be removed once the amount of drainage is less than 20cc (about 1 ounce) every 24 hours for at least 2 consecutive days.    Potential Problems (Troubleshooting):  ?The drain does not stay compressed: The drain should never look round and smooth (like a hand grenade) except when the plug is open while emptying the drain. If it does look round and smooth, it is either full, the plug is open, or there is a leak in the system. Try emptying and resealing the bulb. If it still becomes round and smooth, call your doctors office (This is not an emergency. It can wait until working hours.)   ?The drain suddenly stops draining or starts leaking around the drain site onto the bandage: The drainage should slowly taper off. If the drain suddenly stops collecting fluid or starts leaking at the drain site, it may be clogged. If there is an obvious blockage in the tubing, you may try to gently dislodge the blockage  by squeezing and milking the tubing. Be careful not to pull on the drain as this will pull at the stitch holding it in place and will hurt. If you are unable to dislodge the blockage or you do not see any obvious blockage, call your doctors office (This is not an emergency. It can wait until working hours.)      When to call:   Call if you have bleeding, severe pain, swelling, redness around the drain site, or fever.     Call if the drain is not working properly and you have not been able to fix the problem using the instructions above.     Call if you have any questions.      Week 1 Saturday Sunday Monday Tuesday Wednesday Thursday Friday   Drain A AM           PM           Total                     Drain B AM           PM           Total              Week 2 Saturday Sunday Monday  Tuesday Wednesday Thursday Friday   Drain A AM           PM           Total                     Drain B AM           PM           Total              Week 3 Saturday Sunday Monday Tuesday Wednesday Thursday Friday   Drain A AM           PM           Total                     Drain B AM           PM           Total            Sentinel Node Biopsy/Nuclear Medication Injection  The sentinel node/s are the lymph nodes that the breast drains to first, and are the first lymph nodes to which cancer cells are most likely to spread from a primary breast tumor. Usually, there is more than one sentinel lymph node.  What is Sentinel Lymph Node Biopsy?  A sentinel lymph node biopsy (SLNB) is a procedure in which the sentinel or guardian lymph nodes are identified, removed, and pathologically examined to determine whether cancer cells are present. It is thought that if breast cancer cells were to escape into the lymphatic system, they would travel to the sentinel nodes before moving on to other nodes and the sentinel lymph node (SLN) technique is based upon the observation that tumor cells migrating from a primary tumor metastasize to one or a few  lymph nodes before involving other lymph nodes.  How are the sentinel lymph nodes identified?  Sentinel Node Imaging: Lymphatic mapping is performed the day before, or on the morning of surgery within the nuclear medicine department. A small amount of radioactive material is injected into the breast (injection of the radioactive isotope sometimes stings.)  A radioactive substance is injected near the tumor. The injected material is located visually and/or with a device that detects radioactivity during the surgery. The sentinel node(s) (the first lymph node(s) to take up the material) is (are) removed and checked for cancer cells.

## 2017-03-18 NOTE — Preop H&P (Signed)
CC: [name] presents to [facility] at [facility] to continue the discussion of surgical options for treatment of DCIS left breast.    HPI:  She was diagnosed with ductal carcinoma in situ after a surgical biopsy for a preoperative diagnosis of atypical hyperplasia.  6 cm of ductal carcinoma in situ was identified with multiple close surgical margins.  I discussed the result with her by telephone.  The volume of disease present would best be treated with mastectomy and she is accepting of this recommendation.  She has seen plastic surgery in the interval and has chosen to have bilateral mastectomies with immediate tissue expander reconstructions.  There has been no change in her health or medications since her last office visit.  The surgical site is healing well and she has no concerns or complaints related to it.     Ductal carcinoma in situ (DCIS) of left breast    02/28/2017 Surgery     Left excisional breast biopsy for atypia (Dr. Freda Munro)    Cancer Staging  Ductal carcinoma in situ (DCIS) of left breast  Staging form: Breast, AJCC 8th Edition  - Clinical stage from 02/28/2017: Stage Unknown (cTis (DCIS), cNX, cM0, ER: Positive, PR: Positive, HER2: Not assessed ) - Signed by [name], NP on 03/08/2017  : Tumor size: 353m.  ER: 99%, strong staining; PR: 99%, strong staining;  cribiform and micropapillary type, NG1 with focal, punctate necrosis; closest margin: posterior (<128m, lateral (53m35mand medial (<53mm43m        02/28/2017 Initial Diagnosis     Ductal carcinoma in situ (DCIS) of left breast         02/28/2017 Significant Lab Findings     Cancer Staging  Ductal carcinoma in situ (DCIS) of left breast  Staging form: Breast, AJCC 8th Edition  - Clinical stage from 02/28/2017: Stage Unknown (cTis (DCIS), cNX, cM0, ER: Positive, PR: Positive, HER2: Not assessed ) - Signed by [name] on 03/08/2017  : ER: 99%, strong staining; PR: 99%, strong staining;   In situ component:  100%, cribiform type, NG1 with necrosis, 6cm. Closest margin: posterior <53mm.853m          Medical History:   Past Medical History:   Diagnosis Date    Anxiety     Arthritis     Depression     Ductal carcinoma in situ (DCIS) of left breast     Hypothyroidism     Mitral valve prolapse          Medications:   Current Outpatient Prescriptions   Medication Sig    levothyroxine (SYNTHROID, LEVOTHROID) 100 MCG tablet Take 112 mcg by mouth daily (before breakfast)       aspirin 325 MG EC tablet Take 1 tablet (325 mg total) by mouth daily     No current facility-administered medications for this visit.        Allergies: has No Known Allergies (drug, envir, food or latex).    Surgical history:   Past Surgical History:   Procedure Laterality Date    KNEE CARTILAGE SURGERY Left 2014    KNEE SURGERY Left     PR EXCISE BREAST LES W XRAY MARKER Left 02/28/2017    Procedure: LEFT BREAST BIOPSY WITH NEEDLE LOCALIZATION  ;  Surgeon: OlzinMinus Liberty  Location: [facility];  Service: Oncology General    TONSILLECTOMY         Social:   Social History  Social History    Marital status: Married     Spouse name: N/A    Number of children: N/A    Years of education: N/A     Occupational History    Not on file.     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    Atypical hyperplasia of left breast N62    Ductal carcinoma in situ (DCIS) of left breast D05.12       Family History:   Family History   Problem Relation Age of Onset    Lung cancer Father     Cancer Father         Lung    Breast cancer Sister         half sister    Cancer Sister 78        breast    Rectal cancer  Brother         half brother    Alzheimer's disease Mother     High Blood Pressure Mother     No Known Problems Son     Thyroid disease Sister     Cancer Sister         Breast    Cancer Brother         Prostate    COPD Brother     No Known Problems Son     No Known Problems Son     Diabetes Neg Hx     Family Status   Relation Status    Father Deceased at age 39    Sister 35    Brother Alive    Mother Deceased at age 42    Son Alive    Sister Alive    Sister Alive    Sister Alive    Sister Alive        1/2 sister    Sister Alive        1/2 sister    Brother Alive    Brother Deceased    Son Alive    Son Alive    Neg Hx (Not Specified)         Review of Systems: Comprehensive ROS was taken on the patient intake form and reviewed with the patient.     OBJECTIVE:    Vital Signs:  Blood pressure 132/79, pulse 102, temperature 36.6 C (97.9 F), temperature source Temporal, resp. rate 18, height 155.9 cm (5' 1.38"), weight 75.8 kg (167 lb 1.7 oz).   General appearance: alert, appears stated age and cooperative Well nourished  Eyes: PERRLA, full EOMs, sclera nonicteric  Ears: External ears are normal, hearing is grossly intact.  Nose: Nares normal and patent.  Throat: normal  Lungs: clear to auscultation bilaterally  Heart: regular rate and rhythm, S1, S2 normal, no murmur, click, rub or gallop  Abdomen: Soft, nontender, nondistended, no organomegaly, no palpable masses.  Breasts: Her surgical site is healing well without evidence of infection.      Pertinent Results:  Lab results: 08/29/16  0741   WBC 5.4   Hemoglobin 13.6   Hematocrit 41.0   RBC 4.25   Platelets 282               Lab results: 08/29/16  0741   Sodium 140   Potassium 4.2   Chloride 104   CO2 27   UN 19   Creatinine 0.78   GFR,Caucasian 74   GFR,Black 89   Glucose 103*   Calcium 9.3   Total Protein 6.9   Albumin 4.2   ALT 19   AST 24   Alk Phos 69   Bilirubin,Total 0.6         Last Filed Vitals    03/18/17 1357   BP: 132/79    Pulse: 102   Resp: 18   Temp: 36.6 C (97.9 F)       Impression and Plan: Anticipating bilateral mastectomies with sentinel lymph node biopsy on the left followed by immediate tissue expander reconstructions by Dr. Varney Daily.  We discussed the surgery in detail including the risks and complications.  These include bleeding, infection, wound problems, skin necrosis, arm swelling, and sensory changes.  She signed consent today.  Were tentatively planning surgery for next week.  At the end of our discussion and all of her questions were answered.

## 2017-03-19 ENCOUNTER — Other Ambulatory Visit: Payer: Self-pay | Admitting: Surgery

## 2017-03-19 DIAGNOSIS — Z7189 Other specified counseling: Secondary | ICD-10-CM | POA: Insufficient documentation

## 2017-03-19 DIAGNOSIS — D0512 Intraductal carcinoma in situ of left breast: Secondary | ICD-10-CM

## 2017-03-22 ENCOUNTER — Other Ambulatory Visit: Payer: Self-pay | Admitting: Surgical Oncology

## 2017-03-22 DIAGNOSIS — Z01818 Encounter for other preprocedural examination: Secondary | ICD-10-CM

## 2017-03-22 NOTE — Telephone (Signed)
Patient just had lumpectomy done 1/17 and is coming back for mastectomy with plastics reconstruction.  Patient stated there has been no health, allergy or medications changes since her last surgery.  Patient will go to lab for pre-op bloodwork.~SW

## 2017-03-22 NOTE — Telephone (Signed)
Pre-Operative Instructions Breast Care    Complete blood work no later than 2/12       PRIOR TO SURGERY 2/09    Five days before surgery, please STOP taking if surgeon does not specify:    Anti-inflammatory medications (Ibuprofen, Motrin, Advil, Mobic, Meloxicam, Aleve, Naproxen, Voltaren, etc.)  Vitamins and herbal supplements, including herbal teas   YOU MAY TAKE ACETAMINOPHEN (TYLENOL) as needed    FOLLOW YOUR SURGEON'S INSTRUCTIONS IF DIFFERENT THAN ABOVE.    [place] 2/13    Staff from [facility] will call you by 6pm the evening before the surgery to inform you of your arrival time.The call will be Friday afternoon if your surgery is on a Monday. If you have not received a phone call by 6pm, please phone [phone] and ask to be transferred to the [facility].  Please arrange for transportation to and from the hospital. You must have a responsible adult to stay with you after your surgery.  Do not eat anything after midnight the night before your surgery.   If you have been instructed, please perform your skin prep as directed.     DAY OF SURGERY 2/14    DO NOT CONSUME FOOD OF ANY KIND.  Gatorade, clear apple juice,or water are encouraged from midnight until 2 hours before your scheduled surgery.  Please bring photo identification and your insurance card with you for registration.  If you are female, please be prepared to provide a urine sample on arrival to the preoperative area unless you are one year post menopause or have had a hysterectomy.   DO NOT WEAR: JEWELRY, MAKEUP, DARK NAIL POLISH, HAIR PINS, BODY LOTION OR SCENTS.  Please understand that rings and body piercings should be removed prior to surgery. If they are not removed your surgery is at risk of being delayed or cancelled.   Please do not bring valuables/electronics/cash with you on the day of surgery.[facility]  is not responsible for loss/damage/theft of these items.Thank you.  If wearing eyeglasses, please bring a case.DO NOT WEAR CONTACT LENSES.  You may shower, brush your teeth, and use deodorant.  If you have been instructed, please perform your skin prep as directed.   PLEASE BRING YOUR CPAP MACHINE INTO THE PREOPERATIVE AREA TO BE CHECKED IF YOU ARE STAYING OVERNIGHT, OTHERWISE YOU MAY LEAVE IT IN YOUR VEHICLE.Marland Kitchen  Please bring any medical supplies or equipment requested by your surgeon (Example: Back/neck brace).  Patients are permitted 2 visitors at a time. All visitors must be 73 yrs of age or older.    MEDICATIONS: DAY OF SURGERY 2/14    Take your medications with a as directed on the medication list.  Anxiety and pain medications may be taken as prescribed at any time prior to arrival.   Bring your inhalers and eye drops on the day of surgery to be used as prescribed.  Medications from the [facility] will be administered to you under the direction of your surgeon. Please leave your prescriptions at home.    AT [place] 2/14    Park in the [place] garage.Enter the building through the [employer].Please stop at the Information Desk so that they may direct you to [facility] [address].  Leave your belongings in the car (except for your CPAP) and your visitors may bring them to your room after surgery.    [place]    As a convenience,  prior to your discharge we will provide any discharge medications you require.     The pharmacy is open from 9am to 5:30pm on weekdays and 10am-2pm on Saturdays.     If you will be alone on the day of surgery and want to leave with your prescribed medication, you may:    Bring a check made out to [facility]  Use a credit card to pay for your prescription  Have your family call the pharmacy with a credit card number  Only bring cash to the hospital as a last resort. Prescriptions cannot be  filled without payment. Thank you for your consideration.    QUESTIONS?    Question about these instructions? Call [phone], select option 2, and leave a message for a nurse to return your call.  Any questions regarding specifics about your surgery or recovery? Please call your surgeons office.

## 2017-03-25 ENCOUNTER — Other Ambulatory Visit
Admission: RE | Admit: 2017-03-25 | Discharge: 2017-03-25 | Disposition: A | Discharge status: Home / Self Care | Payer: Medicare (Managed Care) | Source: Ambulatory Visit

## 2017-03-25 DIAGNOSIS — Z01818 Encounter for other preprocedural examination: Secondary | ICD-10-CM

## 2017-03-25 LAB — HEMATOCRIT: Hematocrit: 41 % (ref 34–45)

## 2017-03-25 LAB — MCHC: MCHC: 33 g/dL (ref 32–36)

## 2017-03-25 LAB — TYPE AND SCREEN
ABO RH Blood Type: A POS
Antibody Screen: NEGATIVE

## 2017-03-27 ENCOUNTER — Ambulatory Visit
Admission: RE | Admit: 2017-03-27 | Discharge: 2017-03-27 | Disposition: A | Discharge status: Home / Self Care | Payer: Medicare (Managed Care) | Source: Ambulatory Visit | Attending: Radiology | Admitting: Radiology

## 2017-03-27 ENCOUNTER — Ambulatory Visit: Payer: Medicare (Managed Care) | Attending: Surgical Oncology | Admitting: Surgery

## 2017-03-27 DIAGNOSIS — Z7189 Other specified counseling: Secondary | ICD-10-CM

## 2017-03-27 DIAGNOSIS — D0512 Intraductal carcinoma in situ of left breast: Secondary | ICD-10-CM | POA: Insufficient documentation

## 2017-03-27 DIAGNOSIS — C50412 Malignant neoplasm of upper-outer quadrant of left female breast: Secondary | ICD-10-CM

## 2017-03-27 MED ORDER — TECHNETIUM TC-99M TILMANOCEPT (LYMPHOSEEK) IV *I*
500.0000 | Freq: Once | INTRAVENOUS | Status: AC
Start: 2017-03-27 — End: 2017-03-27
  Administered 2017-03-27: 2100 via INTRADERMAL

## 2017-03-27 NOTE — H&P (Signed)
H&P      HPI: 67 y.o. female with biopsy proven ductal carcinoma in situ of the left breast, s/p needle localized left breast lumpectomy which spans 6 cm.  She has decided to proceed with bilateral mastectomy.  Her current bra size is 38B.  She would like to be restored to the same size if not slightly larger.        PMH:  Past Medical History:   Diagnosis Date    Anxiety     Arthritis     Depression     Ductal carcinoma in situ (DCIS) of left breast     Hypothyroidism     Mitral valve prolapse        PSH:  Past Surgical History:   Procedure Laterality Date    KNEE CARTILAGE SURGERY Left 2014    KNEE SURGERY Left     PR EXCISE BREAST LES W XRAY MARKER Left 02/28/2017    Procedure: LEFT BREAST BIOPSY WITH NEEDLE LOCALIZATION  ;  Surgeon: [name], MD;  Location: [facility];  Service: Oncology General    TONSILLECTOMY         Family History:  Family History   Problem Relation Age of Onset    Lung cancer Father     Cancer Father         Lung    Breast cancer Sister         half sister    Cancer Sister 62        breast    Rectal cancer Brother         half brother    Alzheimer's disease Mother     High Blood Pressure Mother     No Known Problems Son     Thyroid disease Sister     Cancer Sister         Breast    Cancer Brother         Prostate    COPD Brother     No Known Problems Son     No Known Problems Son     Diabetes Neg Hx        Social History:    Social History   Substance Use Topics    Smoking status: Never Smoker    Smokeless tobacco: Never Used    Alcohol use 4.2 oz/week     7 Glasses of wine per week       Medications:    Current Outpatient Prescriptions:     melatonin 3 MG, Take 3 mg by mouth nightly as needed for Sleep, Disp: , Rfl:     levothyroxine (SYNTHROID, LEVOTHROID) 100 MCG tablet, Take 112 mcg by mouth daily (before breakfast)   , Disp: , Rfl:     aspirin 325 MG EC tablet, Take 1 tablet (325 mg total) by mouth daily, Disp: 30 tablet, Rfl:  0      Allergies:  No Known Allergies (drug, envir, food or latex)      Physical Exam:  There were no vitals taken for this visit.    General Appearance:    Alert, cooperative, no distress, appears stated age.     Head:    Normocephalic, without obvious abnormality, atraumatic   Eyes:    PERRL, conjunctiva/corneas clear, EOM's intact, both eyes   Ears:    Normal TM's and external ear canals, both ears   Nose:   Nares normal, septum midline, mucosa normal, no drainage     or sinus tenderness  Throat:   Lips, mucosa, and tongue normal; teeth and gums normal   Neck:   Supple, symmetrical, trachea midline, no adenopathy;     no JVD   Lungs:     Respirations full and unlabored  Breast Exam:    Incision to left breast C/D/I.  Breasts small/moderate in size and youthful in appearance, grade 1 ptosis.  BW 16 cm bilaterally.  No palpable mass, cutaneous lesions or nipple abnormality.      Abdomen:     Soft, non-tender.   Extremities:   Extremities normal, atraumatic, no cyanosis or edema   Pulses:   2+ and symmetric all extremities   Skin:   Skin color, texture, turgor normal, no rashes or lesions.       Lymph nodes:   Cervical, supraclavicular, and axillary nodes normal   Neurologic:   CNII-XII grossly intact, gait stable.       Assessment/Plan: 67 y.o. female with ductal carcinoma in situ of the left breast, pTIS(DCIS).  Electing bilateral mastectomy and immediate implant based breast reconstruction. Risks/benefits/alternative options, expected postoperative course reviewed.  IPPOC complete.  Markings placed.  All of her questions/concerns were addressed to hear satisfaction.      Signed:  Author: Bernadette Hoit, PA  Note created: 03/27/2017  at: 1:45 PM

## 2017-03-27 NOTE — Invasive Procedure Plan of Care (Signed)
Invasive Procedure Plan of Care (Consent Form 419):   Condition(s) Addressed: Acquired absence of breasts   Performing Provider: Marland Mcalpine N, and Resident/s   Side: Bilateral    Procedure: Immediate reconstruction of bilateral breasts with tissue expander placement with acellular dermal matrix   Special Equipment: none   Planned Anesthesia: General and Local   Benefits: Restoration of breasts   Risks: Infection, pain, bleeding, scar, seroma, hematoma, asymmetry, delayed wound healing, injury to surrounding structures, changes in sensation, capsular contracture, implant malposition, implant failure, implant extrusion, ALCL, need for further surgery   Alternatives: No surgery, delayed reconstruction.   Expected Length of Stay: 2 day(s)     I, or a designated member of my surgical team, have discussed the planned procedure, including the potential for any transfusion of blood products or receipt of tissue as necessary, expected benefits, the potential complications and risks and possible alternatives and their benefits and risks with the patient or the patient's surrogate. In my opinion, the patent or the patient's surrogate understands the proposed procedure, its risks, benefits, and alternatives.    Electronically signed by Bernadette Hoit, PA at 1:18 PM     Patient Consent:  I hereby give my consent and authorize LANGSTEIN, HOWARD N, and Resident/s  (The list of possible assistants, all of whom are privileged to provide surgical services at the hospital, is available)  To treat the following: Acquired absence of breasts  Procedure includes: Immediate reconstruction of bilateral breasts with tissue expander placement with acellular dermal matrix  Laterality: Bilateral  1 The care provider has explained my condition to me, the benefits of having the above treatment procedure, and alternate ways of treating my condition. I understand that no guarantees have been made to me about the result of the  treatment. The alternatives to this procedure include: No surgery, delayed reconstruction.   2 The care provider has discussed with me the reasonably foreseeable risks of the treatment and that there may be undesirable results. The risks that are specifically related to this procedure include: Infection, pain, bleeding, scar, seroma, hematoma, asymmetry, delayed wound healing, injury to surrounding structures, changes in sensation, capsular contracture, implant malposition, implant failure, implant extrusion, ALCL, need for further surgery   3 I understand that during the treatment a condition may be discovered which was not known before the treatment started. Therefore, I authorize the care provider to perform any additional or different treatment which is thought necessary and available.   4 Any tissue, parts, or substances removed during the procedure may be retained or disposed of in accordance with customary scientific, educational and clinical practice.   5 Vendor information if appropriate: If a vendor representative is expected to be present during my procedure, it has been explained to me that the vendor representative works for (manufacturer of the device to be used) and that his/her role includes . I consent to the vendor representative's presence and involvement as described. If circumstances change and a decision is made during my procedure that a vendor representative's presence is needed, I will be notified of the above after my procedure is completed.  Equipment: none   6 If blood products are needed, I would agree: Yes   7 If tissue products are needed, I would agree: Yes - tissues may include Acellular dermal matrix.   8 Blood/Tissue use limitations and/or exclusions: none      I have carefully read and fully understand this informed consent form, and have had sufficient opportunity to  discuss my condition and the above procedure(s) with the care provider and his/her associates, and all of my  questions have been answered to my satisfaction. I understand that my surgeon/provider performing the procedure may not be physically present in the operating/procedure room the entire time that I am there. My surgeon/provider has answered my questions regarding this and how it may relate to my surgery/procedure. I agree to the Plan of Care as outlined above.     With respect to receiving blood and/or blood products, I AGREE TO BLOOD TRANSFUSION except where limitations and/or exclusions have been noted above. I have had a chance to discuss the risks, benefits and alternatives regarding transfusion and/or receipt of tissues (as above) with my healthcare provider. My decision(s) regarding the transfusion of blood or blood components and/or the receipt of tissue are as above. I understand this covers my perioperative/periprocedural (before, during, and after the surgery/procedure) course of treatment and my entire hospitalization.  I understand that if my condition changes significantly during my postoperative course that my physicians will discuss the risks and benefits of transfusion with me again at that time. I may be asked to provide a secondary consent to receive or refuse blood transfusion.      With respect to receiving tissues, I AGREE TO RECEIVE TISSUE except where limitations and/or exclusions have been noted above. I have had a chance to discuss the risks, benefits and alternatives regarding the following tissue with my provider: Acellular dermal matrix . I understand that additional tissues may be used in the event of unforeseen circumstances or emergencies and that my treatment team will discuss the use of these items with me in the event that they are deemed necessary.               Patient Signature   (or Parent/Legal Guardian if pt is unable to sign or is a minor)  Date/Time     Electronic Signatures will display at the bottom of the consent form.    Your doctor or someone your doctor has appointed  has told you that you may need blood or a blood product transfusion, which has been collected from volunteers, as part of your treatment as a patient.    The reasons you might need blood or blood products include, but are not limited to:    Significant loss of your own blood   Your body may not be getting enough oxygen to its tissues    Treatment of bleeding disorders caused by low platelet counts or platelets that do not work right (platelets are part of a cell that helps to form clots and keeps you from bleeding too much).   You may not have enough of other substances that help your blood clot or stop you from bleeding more  The risks of getting a transfusion of blood or blood products include, but are not limited to:    Damage to the lungs   Difficulty breathing due to fluid in the lungs   The product may contain bacteria or rarely a virus (which includes HIV and Hepatitis)   Blood from the community blood supply has been collected from volunteer donors who have been screened for health risk. The blood has been tested for major blood transmitted disease, but no transfusion is 100% safe. The blood is tested with very sensitive and accurate tests to screen for hepatitis, AIDS, and other disease, which makes the risks very small.   You may have side effects from the transfusion (  rash, fever, chills) or an allergic reaction   The transfusion increases your risks of getting infection or cancer coming back   The transfusion can increase the time you have to stay in the hospital   The transfusion can potentially cause death if the wrong blood is given or your body rejects the blood   Before blood is transfused, it is tested again to make sure it is the correct type  There are other options than getting blood or blood products from other people and they include:    Drugs which can decrease bleeding   Drugs which can cause your body to make more blood (used in elective procedures with advance  notice)   Autologous (your own blood) donation (needs pre-arrangement)   No transfusion  If you exercise your right to refuse to be transfused with blood or blood products; these things listed below, among others, could happen to you:   Your body may not get enough oxygen and suffer damage   You may have a higher chance of bleeding   You may limit other options for your condition   You may die from losing too much blood

## 2017-03-28 ENCOUNTER — Ambulatory Visit: Payer: Medicare (Managed Care)

## 2017-03-28 ENCOUNTER — Encounter
Admission: RE | Disposition: A | Discharge status: Home / Self Care | Payer: Self-pay | Source: Ambulatory Visit | Attending: Surgery

## 2017-03-28 ENCOUNTER — Inpatient Hospital Stay: Payer: Medicare (Managed Care) | Admitting: Anesthesiology

## 2017-03-28 ENCOUNTER — Inpatient Hospital Stay
Admission: RE | Admit: 2017-03-28 | Discharge: 2017-03-29 | DRG: 581 | Disposition: A | Discharge status: Home / Self Care | Payer: Medicare (Managed Care) | Source: Ambulatory Visit | Attending: Surgery | Admitting: Surgery

## 2017-03-28 DIAGNOSIS — Z7189 Other specified counseling: Secondary | ICD-10-CM | POA: Insufficient documentation

## 2017-03-28 DIAGNOSIS — D0512 Intraductal carcinoma in situ of left breast: Secondary | ICD-10-CM

## 2017-03-28 DIAGNOSIS — R110 Nausea: Secondary | ICD-10-CM | POA: Diagnosis not present

## 2017-03-28 DIAGNOSIS — Z853 Personal history of malignant neoplasm of breast: Secondary | ICD-10-CM | POA: Insufficient documentation

## 2017-03-28 DIAGNOSIS — Z4001 Encounter for prophylactic removal of breast: Secondary | ICD-10-CM | POA: Diagnosis present

## 2017-03-28 DIAGNOSIS — C50912 Malignant neoplasm of unspecified site of left female breast: Principal | ICD-10-CM

## 2017-03-28 HISTORY — PX: PR BX/EXC LYMPH NODE OPEN DEEP AXILLARY NODE: 38525

## 2017-03-28 HISTORY — PX: PR INSERTION BREAST IMPLANT SAME DAY OF MASTECTOMY: 19340

## 2017-03-28 HISTORY — PX: PR MASTECTOMY SIMPLE COMPLETE: 19303

## 2017-03-28 LAB — POCT GLUCOSE
Glucose POCT: 102 mg/dL — ABNORMAL HIGH (ref 60–99)
Glucose POCT: 136 mg/dL — ABNORMAL HIGH (ref 60–99)

## 2017-03-28 SURGERY — REVISION, RECONSTRUCTION, BREAST
Anesthesia: General | Site: Breast | Laterality: Left | Wound class: Clean

## 2017-03-28 MED ORDER — HYDROMORPHONE HCL PF 1 MG/ML IJ SOLN *WRAPPED*
INTRAMUSCULAR | Status: AC
Start: 2017-03-28 — End: 2017-03-28
  Filled 2017-03-28: qty 1

## 2017-03-28 MED ORDER — BUPIVACAINE-EPINEPHRINE 0.25 % IJ SOLUTION *WRAPPED*
INTRAMUSCULAR | Status: AC
Start: 2017-03-28 — End: 2017-03-28
  Filled 2017-03-28: qty 60

## 2017-03-28 MED ORDER — SODIUM CHLORIDE 0.9 % FLUSH FOR PUMPS *I*
0.0000 mL/h | INTRAVENOUS | Status: DC | PRN
Start: 2017-03-28 — End: 2017-03-28

## 2017-03-28 MED ORDER — DOCUSATE SODIUM 100 MG PO CAPS *I*
100.0000 mg | ORAL_CAPSULE | Freq: Every day | ORAL | Status: DC
Start: 2017-03-28 — End: 2017-03-29
  Administered 2017-03-28 – 2017-03-29 (×2): 100 mg via ORAL
  Filled 2017-03-28 (×3): qty 1

## 2017-03-28 MED ORDER — PROMETHAZINE HCL 25 MG/ML IJ SOLN *I*
6.2500 mg | Freq: Once | INTRAMUSCULAR | Status: DC | PRN
Start: 2017-03-28 — End: 2017-03-28

## 2017-03-28 MED ORDER — SODIUM CHLORIDE 0.9 % IV SOLN WRAPPED *I*
INTRAMUSCULAR | Status: DC | PRN
Start: 2017-03-28 — End: 2017-03-28
  Administered 2017-03-28: 2000 mL

## 2017-03-28 MED ORDER — LACTATED RINGERS IV SOLN *I*
125.0000 mL/h | INTRAVENOUS | Status: AC
Start: 2017-03-28 — End: 2017-03-29
  Administered 2017-03-28: 125 mL/h
  Administered 2017-03-28: 125 mL/h via INTRAVENOUS
  Administered 2017-03-28 – 2017-03-29 (×9): 125 mL/h

## 2017-03-28 MED ORDER — CEFAZOLIN 2000 MG IN STERILE WATER 20ML SYRINGE *I*
2000.0000 mg | PREFILLED_SYRINGE | Freq: Once | INTRAVENOUS | Status: AC
Start: 2017-03-28 — End: 2017-03-28
  Administered 2017-03-28: 2000 mg via INTRAVENOUS
  Filled 2017-03-28: qty 20

## 2017-03-28 MED ORDER — HEPARIN SODIUM 5000 UNIT/ML SQ *I*
5000.0000 [IU] | Freq: Three times a day (TID) | SUBCUTANEOUS | Status: DC
Start: 2017-03-28 — End: 2017-03-29
  Administered 2017-03-28 – 2017-03-29 (×3): 5000 [IU] via SUBCUTANEOUS
  Filled 2017-03-28 (×3): qty 1

## 2017-03-28 MED ORDER — LEVOTHYROXINE SODIUM 112 MCG PO TABS *I*
112.0000 ug | ORAL_TABLET | Freq: Every day | ORAL | Status: DC
Start: 2017-03-29 — End: 2017-03-29
  Administered 2017-03-29: 112 ug via ORAL
  Filled 2017-03-28: qty 1

## 2017-03-28 MED ORDER — LIDOCAINE HCL 2 % (PF) IJ SOLN *I*
INTRAMUSCULAR | Status: AC
Start: 2017-03-28 — End: 2017-03-28
  Filled 2017-03-28: qty 5

## 2017-03-28 MED ORDER — MEPERIDINE HCL 25 MG/ML IJ SOLN *I*
12.5000 mg | INTRAMUSCULAR | Status: DC | PRN
Start: 2017-03-28 — End: 2017-03-28

## 2017-03-28 MED ORDER — OXYCODONE HCL 10 MG PO TABS *I*
10.0000 mg | ORAL_TABLET | ORAL | Status: DC | PRN
Start: 2017-03-28 — End: 2017-03-29
  Administered 2017-03-28 – 2017-03-29 (×3): 10 mg via ORAL
  Filled 2017-03-28 (×3): qty 1

## 2017-03-28 MED ORDER — LIDOCAINE HCL 2 % IJ SOLN *I*
INTRAMUSCULAR | Status: DC | PRN
Start: 2017-03-28 — End: 2017-03-28
  Administered 2017-03-28: 60 mg via INTRAVENOUS

## 2017-03-28 MED ORDER — LACTATED RINGERS IV SOLN *I*
125.0000 mL/h | INTRAVENOUS | Status: DC
Start: 2017-03-28 — End: 2017-03-28
  Administered 2017-03-28: 125 mL/h via INTRAVENOUS

## 2017-03-28 MED ORDER — BACITRACIN 50000 UNIT IM SOLR *I*
INTRAMUSCULAR | Status: AC
Start: 2017-03-28 — End: 2017-03-28
  Filled 2017-03-28: qty 10

## 2017-03-28 MED ORDER — MIDAZOLAM HCL 1 MG/ML IJ SOLN *I* WRAPPED
INTRAMUSCULAR | Status: DC | PRN
Start: 2017-03-28 — End: 2017-03-28
  Administered 2017-03-28: 2 mg via INTRAVENOUS

## 2017-03-28 MED ORDER — PROPOFOL 10 MG/ML IV EMUL (INTERMITTENT DOSING) WRAPPED *I*
INTRAVENOUS | Status: AC
Start: 2017-03-28 — End: 2017-03-28
  Filled 2017-03-28: qty 20

## 2017-03-28 MED ORDER — METOCLOPRAMIDE HCL 5 MG/ML IJ SOLN *I*
INTRAMUSCULAR | Status: DC | PRN
Start: 2017-03-28 — End: 2017-03-28
  Administered 2017-03-28: 10 mg via INTRAVENOUS

## 2017-03-28 MED ORDER — PROPOFOL 10 MG/ML IV EMUL (INTERMITTENT DOSING) WRAPPED *I*
INTRAVENOUS | Status: DC | PRN
Start: 2017-03-28 — End: 2017-03-28
  Administered 2017-03-28: 200 mg via INTRAVENOUS

## 2017-03-28 MED ORDER — DIPHENHYDRAMINE HCL 50 MG/ML IJ SOLN *I*
25.0000 mg | INTRAMUSCULAR | Status: DC | PRN
Start: 2017-03-28 — End: 2017-03-28

## 2017-03-28 MED ORDER — ONDANSETRON HCL 2 MG/ML IV SOLN *I*
4.0000 mg | Freq: Four times a day (QID) | INTRAMUSCULAR | Status: DC | PRN
Start: 2017-03-28 — End: 2017-03-29
  Administered 2017-03-28 (×2): 4 mg via INTRAVENOUS
  Filled 2017-03-28 (×2): qty 2

## 2017-03-28 MED ORDER — HEPARIN SODIUM 5000 UNIT/ML SQ *I*
5000.0000 [IU] | Freq: Once | SUBCUTANEOUS | Status: AC
Start: 2017-03-28 — End: 2017-03-28
  Administered 2017-03-28: 5000 [IU] via SUBCUTANEOUS
  Filled 2017-03-28: qty 1

## 2017-03-28 MED ORDER — INSULIN LISPRO (HUMAN) 100 UNIT/ML IJ/SC SOLN *WRAPPED*
0.0000 [IU] | SUBCUTANEOUS | Status: DC | PRN
Start: 2017-03-28 — End: 2017-03-28

## 2017-03-28 MED ORDER — HALOPERIDOL LACTATE 5 MG/ML IJ SOLN *I*
0.5000 mg | Freq: Once | INTRAMUSCULAR | Status: AC | PRN
Start: 2017-03-28 — End: 2017-03-28
  Administered 2017-03-28: 0.5 mg via INTRAVENOUS
  Filled 2017-03-28: qty 1

## 2017-03-28 MED ORDER — ONDANSETRON HCL 2 MG/ML IV SOLN *I*
INTRAMUSCULAR | Status: DC | PRN
Start: 2017-03-28 — End: 2017-03-28
  Administered 2017-03-28: 4 mg via INTRAVENOUS

## 2017-03-28 MED ORDER — ONDANSETRON HCL 2 MG/ML IV SOLN *I*
INTRAMUSCULAR | Status: AC
Start: 2017-03-28 — End: 2017-03-28
  Filled 2017-03-28: qty 2

## 2017-03-28 MED ORDER — VANCOMYCIN HCL 1000 MG IV SOLR WRAPPED *I*
INTRAVENOUS | Status: AC
Start: 2017-03-28 — End: 2017-03-28
  Filled 2017-03-28: qty 10

## 2017-03-28 MED ORDER — HYDROMORPHONE HCL PF 0.5 MG/0.5 ML IJ SOLN *I*
0.5000 mg | INTRAMUSCULAR | Status: DC | PRN
Start: 2017-03-28 — End: 2017-03-28
  Administered 2017-03-28 (×2): 0.5 mg via INTRAVENOUS
  Filled 2017-03-28 (×2): qty 0.5

## 2017-03-28 MED ORDER — METHYLENE BLUE 0.5% 1 ML STERILE SYRINGE *I*
INTRAVENOUS | Status: AC
Start: 2017-03-28 — End: 2017-03-28
  Filled 2017-03-28: qty 1

## 2017-03-28 MED ORDER — HYDROMORPHONE HCL PF 1 MG/ML IJ SOLN *WRAPPED*
INTRAMUSCULAR | Status: DC | PRN
Start: 2017-03-28 — End: 2017-03-28
  Administered 2017-03-28 (×3): 1 mg via INTRAVENOUS

## 2017-03-28 MED ORDER — ALBUTEROL SULFATE (2.5 MG/3ML) 0.083% IN NEBU *I*
2.5000 mg | INHALATION_SOLUTION | Freq: Once | RESPIRATORY_TRACT | Status: DC | PRN
Start: 2017-03-28 — End: 2017-03-28

## 2017-03-28 MED ORDER — LIDOCAINE HCL (PF) 1 % IJ SOLN *I*
0.1000 mL | INTRAMUSCULAR | Status: DC | PRN
Start: 2017-03-28 — End: 2017-03-28
  Administered 2017-03-28: 0.1 mL via SUBCUTANEOUS
  Filled 2017-03-28: qty 2

## 2017-03-28 MED ORDER — METOPROLOL TARTRATE 1 MG/ML IV SOLN *I*
INTRAVENOUS | Status: AC
Start: 2017-03-28 — End: 2017-03-28
  Filled 2017-03-28: qty 5

## 2017-03-28 MED ORDER — OXYCODONE HCL 5 MG PO TABS *I*
5.0000 mg | ORAL_TABLET | ORAL | Status: DC | PRN
Start: 2017-03-28 — End: 2017-03-29
  Administered 2017-03-28: 5 mg via ORAL
  Filled 2017-03-28: qty 1

## 2017-03-28 MED ORDER — DEXAMETHASONE SODIUM PHOSPHATE 4 MG/ML INJ SOLN *WRAPPED*
INTRAMUSCULAR | Status: DC | PRN
Start: 2017-03-28 — End: 2017-03-28
  Administered 2017-03-28: 8 mg via INTRAVENOUS

## 2017-03-28 MED ORDER — MIDAZOLAM HCL 1 MG/ML IJ SOLN *I* WRAPPED
INTRAMUSCULAR | Status: AC
Start: 2017-03-28 — End: 2017-03-28
  Filled 2017-03-28: qty 2

## 2017-03-28 MED ORDER — SODIUM CHLORIDE 0.9 % INJ (FLUSH) WRAPPED *I*
3.0000 mL | Freq: Three times a day (TID) | Status: DC
Start: 2017-03-28 — End: 2017-03-29
  Administered 2017-03-28 – 2017-03-29 (×2): 3 mL via INTRAVENOUS

## 2017-03-28 MED ORDER — BUPIVACAINE-EPINEPHRINE 0.25 % IJ SOLUTION *WRAPPED*
INTRAMUSCULAR | Status: DC | PRN
Start: 2017-03-28 — End: 2017-03-28
  Administered 2017-03-28: 20 mL via SUBCUTANEOUS

## 2017-03-28 MED ORDER — LACTATED RINGERS IV SOLN *I*
20.0000 mL/h | INTRAVENOUS | Status: DC
Start: 2017-03-28 — End: 2017-03-28
  Administered 2017-03-28: 20 mL/h via INTRAVENOUS

## 2017-03-28 MED ORDER — VANCOMYCIN HCL 1000 MG IV SOLR WRAPPED *I*
INTRAVENOUS | Status: DC | PRN
Start: 2017-03-28 — End: 2017-03-28
  Administered 2017-03-28: 1 g via TOPICAL

## 2017-03-28 MED ORDER — MELATONIN 3 MG PO TABS *I*
3.0000 mg | ORAL_TABLET | Freq: Every evening | ORAL | Status: DC | PRN
Start: 2017-03-28 — End: 2017-03-29

## 2017-03-28 MED ORDER — DEXAMETHASONE SODIUM PHOSPHATE 4 MG/ML INJ SOLN *WRAPPED*
INTRAMUSCULAR | Status: AC
Start: 2017-03-28 — End: 2017-03-28
  Filled 2017-03-28: qty 2

## 2017-03-28 MED ORDER — CEFAZOLIN 1000 MG IN STERILE WATER 10ML SYRINGE *I*
1000.0000 mg | PREFILLED_SYRINGE | Freq: Three times a day (TID) | INTRAVENOUS | Status: AC
Start: 2017-03-28 — End: 2017-03-29
  Administered 2017-03-28 – 2017-03-29 (×3): 1000 mg via INTRAVENOUS
  Filled 2017-03-28 (×3): qty 10

## 2017-03-28 MED ORDER — ACETAMINOPHEN 500 MG PO TABS *I*
1000.0000 mg | ORAL_TABLET | Freq: Three times a day (TID) | ORAL | Status: DC
Start: 2017-03-28 — End: 2017-03-29
  Administered 2017-03-28 – 2017-03-29 (×4): 1000 mg via ORAL
  Filled 2017-03-28 (×4): qty 2

## 2017-03-28 SURGICAL SUPPLY — 84 items
APPLICATOR CHLORAPREP 26ML ORANGE LARGE (Solution) ×1 IMPLANT
APPLIER CLIP MCA20 SM 9-3/8 IN (Supply) IMPLANT
APPLIER CLIP MED SHORT 9-3/8IN (Supply) ×3 IMPLANT
BANDAGE CONFORM GAUZE 4 X 4.1YD STER (Dressing) IMPLANT
BLADE ELECTRODE COATED 2.5IN (Supply) ×4 IMPLANT
BLADE ELECTRODE COATED 6IN (Supply) IMPLANT
BLADE ELECTRODE INSUL 2.75IN DISP (Supply) IMPLANT
BLADE SURG CARBON STEEL #15 STER (Supply) ×12 IMPLANT
BLADE SURG CARBON STEEL #15 STER REUSE HNDL (Supply) ×6 IMPLANT
BLANKET LOWER BODY TEMP THERAPY (Drape) ×3 IMPLANT
BULB RESERVOIR SUCT 100CC SILICONE (Supply) IMPLANT
CHECK IMPLANTS/ON CASE CART (Other) IMPLANT
CLIP LIGATING TI HORIZON WIDE SLOT RED SM (Supply) IMPLANT
CLOSURE STERI-STRIP REINF .5 X 4IN LF (Dressing) IMPLANT
CORD BIPOLAR 12 FT (Supply) ×2
CORD ELECTROSURGICAL L12FT BIPOLAR DISPOSABLE FOR FOOT SWITCHING FORCEPS (Supply) ×2 IMPLANT
COVER PROBE W/GEL AND BANDS 6X96 (Supply) ×3 IMPLANT
Cortiva 1mm allograft dermis ×5 IMPLANT
DRAIN HEMADUCT 19F (Supply)
DRAIN WND 19FR 3/4 CHN RND SIL (Supply) IMPLANT
DRAIN WOUND ROUND 15FR W/TROCAR (Supply) ×12 IMPLANT
DRAIN WOUND SILICONE ROUND 15FR (Supply) IMPLANT
DRAPE BREAST/CHEST 15X10 FENESTRATED (Drape) ×4 IMPLANT
DRAPE SHEET 53X77 (Drape) ×4
DRAPE STERI INST.POUCH (Drape) ×4 IMPLANT
DRAPE SUR 3 QTR W53XL77IN SMS POLYPR ~~LOC~~ (Drape) ×4 IMPLANT
DRESSING ABD PAD DERMACEA 5 X 9IN STER (Dressing) IMPLANT
DRESSING FLUFF SUPER SP 6 X 6.75IN STER (Dressing) IMPLANT
DRESSING TEGADERM 4 X 10IN (Dressing) ×4 IMPLANT
DRESSING TEGADERM 6 X 8IN (Dressing) ×4 IMPLANT
DRESSING TEGADERM W/LABEL 2 3/8 X 2 3/4 (Dressing) ×8 IMPLANT
DRESSING TELFA 8X3 RELEASE (Dressing) ×4 IMPLANT
EIGR WAVEGUIDE, WIDE/ FLAT (Other) IMPLANT
ELECTRODE EXT BLADE SS 6.5IN (Supply) IMPLANT
EXPANDER TISSUE MOD STYLE 133MV 600CC (Implant) ×6 IMPLANT
FILTER NEPTUNE 4PORT MANIFOLD (Supply) ×5 IMPLANT
GLOVE LINER BIOGEL INDICATOR SZ7.5 LTX (Glove) ×4 IMPLANT
GLOVE SURG BIOGEL SZ7.5 LTX STER PF (Glove) ×4 IMPLANT
GLOVE SURG PROTEXIS SZ 6.5 PF LTX (Glove) ×31 IMPLANT
GOWN SIRUS NONREINFORCED SMALL (Other) ×4 IMPLANT
MARKER SUR ST STD SKIN DISP WRITESITE + (Supply) ×2 IMPLANT
MARKER SURGICAL PEN (Supply) ×2
NEEDLE HYPO BVL LF 25G X 1.5IN (Needle) ×1 IMPLANT
NEEDLE HYPO LF 22G X 1.5IN BLACK (Needle) IMPLANT
NEEDLE QUINCKE SPI 20G X 3.5IN (Needle) ×4 IMPLANT
PACK CUSTOM GENERAL PACK (Pack) ×4 IMPLANT
PACK CUSTOM GENERAL PLASTIC (Pack) ×4 IMPLANT
PAD EYE GAUZE COVERED OVAL LF STER (Dressing) IMPLANT
PATTIE SURGICAL 1/2IN X 3IN (Other) IMPLANT
PEN SURG MARKER REG TIP (Other) ×16 IMPLANT
PENCIL ROCKER SWITCH W/HOLSTER SS DISP (Supply) ×3 IMPLANT
POSITIONER HEAD RING 9IN (Supply) ×4 IMPLANT
PROTECTOR ULNA NERVE ~~LOC~~ (Supply) ×4 IMPLANT
SLEEVE COMP KNEE HI MED (Supply) ×4 IMPLANT
SOL CHG SCRUB 4 OZ (Other) ×2
SOL LACT RINGER INJ 1000ML BAG (Drug) ×2 IMPLANT
SOLUTION SUR PREP 4OZ 4% CHG BTL EXIDINE (Other) ×2 IMPLANT
SPONGE LAPAROTOMY W18XL18IN 7IN LOOP WHITE COTTON 4 PLY RADIOPAQUE PREWASHED DELINTED (Sponge) ×10 IMPLANT
SPONGE XRAY DETACHABLE 18X18 (Sponge) ×5
STOCKINETTE IMPERV 9X48IN MED STERILE (Drape) IMPLANT
SUTR CHROMIC GUT 4-0 FS-2 27IN (Suture) ×2 IMPLANT
SUTR MONCRYL 3-0 PS-2 UND 27IN (Suture) ×13 IMPLANT
SUTR MONOCRYL 4-0 PS-2 18 IN UNDY (Suture) ×4 IMPLANT
SUTR MONOCRYL PLUS 4-0 18PS2 (Suture) ×8 IMPLANT
SUTR MONOCRYL SH3 0 27IN UNDY (Suture) IMPLANT
SUTR PDS II MONO 2-0 CT1 27IN CLEAR (Suture) ×28 IMPLANT
SUTR PLAIN GUT 5-0 PC-1 18 IN YELLOW (Suture) IMPLANT
SUTR SILK 2-0 FS 18 IN BLACK (Suture) ×16 IMPLANT
SUTR SILK 2-0 SH 30 IN BLACK (Suture) ×7 IMPLANT
SUTR VICRYL ANTIB 2-0 CT1 8-18 UNDY (Suture) ×4 IMPLANT
SUTR VICRYL ANTIB 3-0 SH 18 UNDY (Suture) ×4 IMPLANT
SUTR VICRYL ANTIB 3-0 SH 18 VIOLET (Suture) ×4 IMPLANT
SUTR VICRYL ANTIB 3-0 SH 27 UNDY (Suture) ×8 IMPLANT
SUTR VICRYL NDL 5 0 PS3 UNDY (Suture) IMPLANT
SYRINGE IRRIG BULB 50CC (Syringe) ×4 IMPLANT
SYRINGE LUERLOCK 20CC (Syringe)
SYRINGE LUERLOCK 20ML INDIVIDUAL WRAP (Syringe)
SYRINGE LUERLOCK 20ML INDIVIDUAL WRAPPED (Syringe) IMPLANT
TIP SUCT YANK BULBOUS W/O VENT (Supply) ×4 IMPLANT
TIP YANKAUER SUCT BULBOUS ON/OFF LF (Supply) ×4 IMPLANT
TRAP MUCUS 40CC SPECIMEN NL (Supply) IMPLANT
TRAY PATIENT PREP (Tray) ×2
TRAY PREP SKIN INCL 8 DRY GZ PD TWO 6IN COT TIP APPL 2 STK SPNG (Tray) ×2 IMPLANT
TUBING NONCONDUC CONN 12FT X 3/16IN (Tubing) ×4 IMPLANT

## 2017-03-28 NOTE — INTERIM OP NOTE (Signed)
Dispo: inpatient  Antibiotic regimen: ancef, followed by keflex  Pain Management: scheduled tylenol, prn oxy  DVT/HO prophylaxis: SQH, SCDs, ambulate  Dressing/wound care: surigical bra, fluffs  Drain locations: 4 total, 2 on each side in subcutaneous space  Additional Specifics: n/a    Interim Op Note (Surgical Log ID: [id number])       Date of Surgery: 03/28/2017       Surgeons: Juliann Mule) and Role:  Panel 1:     * Hendricks Milo, MD - Primary     * Langstein, Celene Skeen, MD - Primary     * Manning Charity, MD - Resident - Assisting    Panel 2:     [name], MD - Primary       Pre-op Diagnosis: Pre-Op Diagnosis Codes:     * 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.12]     * Encounter to discuss breast reconstruction [Z71.89]       Post-op Diagnosis: Post-Op Diagnosis Codes:     * 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.12]     * Encounter to discuss breast reconstruction [Z71.89]       Procedure(s) Performed: Procedure:    BILATERAL IMMEDIATE RECONSTRUCTION OF BREASTS WITH TISSUE EXPANDER INSERTION AND ACELLULAR DERMAL MATRIX  CPT(R) Code:  14782 - PR INSERT BREAST PROS IMMED AFTER EXCIS    Procedure:    BILATERAL MASTECTOMY  CPT(R) Code:  95621 - PR MASTECTOMY, SIMPLE, COMPLETE    Procedure:    AXILLARY LYMPH NODE BIOPSY OR DISSECTION  CPT(R) Code:  30865 - PR BX/REMV,LYMPH NODE,DEEP AXILL         Additional CPT Codes: 78469: [facility];         Anesthesia Type: General        Fluid Totals: I/O this shift:  02/14 0700 - 02/14 1459  In: 2500 (33.2 mL/kg) [I.V.:2500]  Out: 100 (1.3 mL/kg) [Blood:100]  Net: 2400  Weight: 75.3 kg        Estimated Blood Loss: 100 mL       Specimens to Pathology:  ID Type Source Tests Collected by Time Destination   A : Right breast phrophylactic mastectomy short stitch superior long stitch lateral  TISSUE Breast SURGICAL PATHOLOGY Sampson Goon, RN 03/28/2017 0945    B : Left breast total mastectomy short stitch superior long  stitch lateral prior biopsy for atypical hyperplasia TISSUE Breast SURGICAL PATHOLOGY Sampson Goon, RN 03/28/2017 1043    C : Left axillary sentinel lymph node #1 TISSUE Lymph node SURGICAL PATHOLOGY Sampson Goon, RN 03/28/2017 1045           Temporary Implants: Expander Tissue Mod Style 154mv 600cc; Removal Plan: Exchange For Final Implant ; (Anticipated Removal Date:07/26/2017);     Expander Tissue Mod Style 17mv 600cc; Removal Plan: Exchange For Final Implant ; (Anticipated Removal Date:07/26/2017);            Packing:                 Patient Condition: good       Findings (Including unexpected complications): none     Signed:  Manning Charity, MD  on 03/28/2017 at 1:11 PM

## 2017-03-28 NOTE — Anesthesia Case Conclusion (Signed)
CASE CONCLUSION  Emergence  Actions:  LMA removed  Criteria Used for Airway Removal:  Adequate Tv & RR and acceptable O2 saturation  Assessment:  Routine  Transport  Directly to: PACU  Position:  Upright  Patient Condition on Handoff  Level of Consciousness:  Alert/talking/calm  Patient Condition:  Stable  Handoff Report to:  RN

## 2017-03-28 NOTE — Op Note (Signed)
[name], [name] MR #:  [id number]   CSN:  [id number] DOB:  1950/09/15    AGE:  67     SURGEON:  Cleotis Nipper, MD  CO-SURGEON:    ASSISTANT:  Sudie Grumbling, PA.  SURGERY DATE:  03/28/2017    PREOPERATIVE DIAGNOSIS:    1. Left breast ductal carcinoma in situ.  2. Risk for right breast cancer.    POSTOPERATIVE DIAGNOSIS:    1. Left breast ductal carcinoma in situ.  2. Risk for right breast cancer.    OPERATIVE PROCEDURE:    1. Right breast prophylactic mastectomy.  2. Left breast total mastectomy.  3. Left axillary sentinel lymph node biopsy.    ANESTHESIA:  General by LMA by Dr. Chelsea Primus.    ESTIMATED BLOOD LOSS:  75 cc.    COMPLICATIONS:  None.    SPECIMENS:    1. Right breast total mastectomy.  2. Left breast total mastectomy.  3. Left axillary sentinel lymph node.    BRIEF HISTORY:  The patient is a 67 year old female who had a surgical biopsy for a preoperative diagnosis of atypical hyperplasia.  That biopsy showed extensive ductal carcinoma in situ.  She elected to have a mastectomy as definitive treatment, and sentinel lymph node biopsy was planned for staging.  She also elected to have a right prophylactic mastectomy due to a strong family history.  Consent for all procedures was obtained and on the chart at the time of the surgery.  An injection of radioisotope for sentinel node identification was administered at [facility] the day prior to the surgery.  She planned immediate tissue expander reconstructions.  Dr. Marland Mcalpine performed these procedures.  There is a separate dictation detailing his portions.  The surgical sites were marked by the patient and confirmed by both Dr. Varney Daily and myself prior to the surgery.    DESCRIPTION OF PROCEDURE:  The patient was brought to the operating room and placed in the supine position on the operating table with the arms extended.  After the adequate induction of anesthesia, the breasts and left axilla were prepped and draped  in the usual sterile fashion.  Beginning on the right side, a total mastectomy was performed.  An elliptical incision was created to encompass the nipple-areolar complex and extended down until the breast tissue was encountered.  Skin flaps were then raised in all 4 directions.  The superior extent of dissection was the clavicle, the medial extent the sternum, the lateral extent the serratus anterior muscle, and the inferior extent the inframammary crease.  The breast was then reflected off the chest wall to include the pectoralis major fascia.  It was labeled short stitch superior and long stitch lateral, and sent for permanent section.  Hemostasis was assured, and the tissues were irrigated.       Attention was then turned to the left side, where a total mastectomy was performed in exactly the same fashion.  Prior to passing off the specimen, the axillary tail of the breast was interrogated with the neoprobe and a single, normal-appearing sentinel node was easily identified.  This was separated from the mastectomy specimen and sent as left axillary sentinel node.  The axillary fascia was then opened, and it was interrogated with the Neoprobe in its entirety.  There were no additional isotope focuses or abnormal lymph nodes that could be identified.  All tissues were inspected for hemostasis, and the entire procedure was then turned over to Dr. Varney Daily for  completion of the reconstructions.  The patient tolerated this portion of the procedure well.             ______________________________  Cleotis Nipper, MD    AO/MODL  DD:  03/28/2017 11:12:58  DT:  03/28/2017 11:28:53  Job #:  [id number]    cc:

## 2017-03-28 NOTE — Progress Notes (Signed)
03/28/17 1400   UM Patient Class Review   Patient Class Review Inpatient

## 2017-03-28 NOTE — Progress Notes (Signed)
Surgery Division I Progress Note    SUBJECTIVE: Feeling a little nauseous but tolerating liquids.  Pain well controlled.  Has not gotten OOB yet      OBJECTIVE:     Current Vitals Vitals Range (24 Hours)   BP 130/82 (BP Location: Right arm)    Pulse 84    Temp 36.1 C (97 F) (Temporal)    Resp 11    Ht 1.549 m (5\' 1" )    Wt 75.3 kg (166 lb)    SpO2 95%    BMI 31.37 kg/m  BP: (118-151)/(75-116)   Temp:  [36 C (96.8 F)-37.2 C (99 F)]   Temp src: Temporal (02/14 1530)  Heart Rate:  [76-95]   Resp:  [10-16]   SpO2:  [92 %-98 %]   Height:  [154.9 cm (5\' 1" )]   Weight:  [75.3 kg (166 lb)]      I/O last 3 completed shifts:  02/13 1500 - 02/14 1459  In: 2625 (34.9 mL/kg) [I.V.:2625 (1.5 mL/kg/hr)]  Out: 140 (1.9 mL/kg) [Drains:40; Blood:100]  Net: 2485  Weight: 75.3 kg         Physical Exam:  General: Alert and oriented resting comfortably in NAD  Chest: Incisions flat, skin flaps healthy, no ecchymoses or hematoma.  JP drains with serosanguinous output  CV: RRR  Resp: Respirations regular,non-labored saturating appropriately on room air  Abd: soft, non-tender  Ext: SCDs on and running.  Calves soft bil    ASSESSMENT: 67 y.o. female POD#0 s/p bilateral mastectomy with left axillary node biopsy and immediate tissue expander reconstruction.  Doing well       PLAN:  - Perioperative ancef  - SQ heparin and SCDs for DVT prophylaxis  - Pain control as ordered  - Regular diet  - Ambulate  - JP drain care and teaching      Donnelly Stager, PA on 03/28/2017 at 4:43 PM

## 2017-03-28 NOTE — Progress Notes (Addendum)
Patient is quite anxious in the PACU complains of SOB but is teary and fidgeting.  States she was anxious preoperatively.  She is having trouble determining if she is having pain or if she is just anxious.  Dr Clyde Canterbury aware and OK for patient to be discharged to the floor.  Report to [place] RN

## 2017-03-28 NOTE — H&P (View-Only) (Signed)
CC: [name] presents to [facility] at [facility] to continue the discussion of surgical options for treatment of DCIS left breast.    HPI:  She was diagnosed with ductal carcinoma in situ after a surgical biopsy for a preoperative diagnosis of atypical hyperplasia.  6 cm of ductal carcinoma in situ was identified with multiple close surgical margins.  I discussed the result with her by telephone.  The volume of disease present would best be treated with mastectomy and she is accepting of this recommendation.  She has seen plastic surgery in the interval and has chosen to have bilateral mastectomies with immediate tissue expander reconstructions.  There has been no change in her health or medications since her last office visit.  The surgical site is healing well and she has no concerns or complaints related to it.     Ductal carcinoma in situ (DCIS) of left breast    02/28/2017 Surgery     Left excisional breast biopsy for atypia (Dr. Freda Munro)    Cancer Staging  Ductal carcinoma in situ (DCIS) of left breast  Staging form: Breast, AJCC 8th Edition  - Clinical stage from 02/28/2017: Stage Unknown (cTis (DCIS), cNX, cM0, ER: Positive, PR: Positive, HER2: Not assessed ) - Signed by [name], NP on 03/08/2017  : Tumor size: 39m.  ER: 99%, strong staining; PR: 99%, strong staining;  cribiform and micropapillary type, NG1 with focal, punctate necrosis; closest margin: posterior (<114m, lateral (21m28mand medial (<21mm55m        02/28/2017 Initial Diagnosis     Ductal carcinoma in situ (DCIS) of left breast         02/28/2017 Significant Lab Findings     Cancer Staging  Ductal carcinoma in situ (DCIS) of left breast  Staging form: Breast, AJCC 8th Edition  - Clinical stage from 02/28/2017: Stage Unknown (cTis (DCIS), cNX, cM0, ER: Positive, PR: Positive, HER2: Not assessed ) - Signed by [name] on 03/08/2017  : ER: 99%, strong staining; PR: 99%, strong staining;   In situ component:  100%, cribiform type, NG1 with necrosis, 6cm. Closest margin: posterior <21mm.48m          Medical History:   Past Medical History:   Diagnosis Date    Anxiety     Arthritis     Depression     Ductal carcinoma in situ (DCIS) of left breast     Hypothyroidism     Mitral valve prolapse          Medications:   Current Outpatient Prescriptions   Medication Sig    levothyroxine (SYNTHROID, LEVOTHROID) 100 MCG tablet Take 112 mcg by mouth daily (before breakfast)       aspirin 325 MG EC tablet Take 1 tablet (325 mg total) by mouth daily     No current facility-administered medications for this visit.        Allergies: has No Known Allergies (drug, envir, food or latex).    Surgical history:   Past Surgical History:   Procedure Laterality Date    KNEE CARTILAGE SURGERY Left 2014    KNEE SURGERY Left     PR EXCISE BREAST LES W XRAY MARKER Left 02/28/2017    Procedure: LEFT BREAST BIOPSY WITH NEEDLE LOCALIZATION  ;  Surgeon: OlzinMinus Liberty  [facility];  Service: Oncology General    TONSILLECTOMY         Social:   Social History  Social History    Marital status: Married     Spouse name: N/A    Number of children: N/A    Years of education: N/A     Occupational History    Not on file.     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    Atypical hyperplasia of left breast N62    Ductal carcinoma in situ (DCIS) of left breast D05.12       Family History:   Family History   Problem Relation Age of Onset    Lung cancer Father     Cancer Father         Lung    Breast cancer Sister         half sister    Cancer Sister 78        breast    Rectal cancer  Brother         half brother    Alzheimer's disease Mother     High Blood Pressure Mother     No Known Problems Son     Thyroid disease Sister     Cancer Sister         Breast    Cancer Brother         Prostate    COPD Brother     No Known Problems Son     No Known Problems Son     Diabetes Neg Hx     Family Status   Relation Status    Father Deceased at age 39    Sister 35    Brother Alive    Mother Deceased at age 42    Son Alive    Sister Alive    Sister Alive    Sister Alive    Sister Alive        1/2 sister    Sister Alive        1/2 sister    Brother Alive    Brother Deceased    Son Alive    Son Alive    Neg Hx (Not Specified)         Review of Systems: Comprehensive ROS was taken on the patient intake form and reviewed with the patient.     OBJECTIVE:    Vital Signs:  Blood pressure 132/79, pulse 102, temperature 36.6 C (97.9 F), temperature source Temporal, resp. rate 18, height 155.9 cm (5' 1.38"), weight 75.8 kg (167 lb 1.7 oz).   General appearance: alert, appears stated age and cooperative Well nourished  Eyes: PERRLA, full EOMs, sclera nonicteric  Ears: External ears are normal, hearing is grossly intact.  Nose: Nares normal and patent.  Throat: normal  Lungs: clear to auscultation bilaterally  Heart: regular rate and rhythm, S1, S2 normal, no murmur, click, rub or gallop  Abdomen: Soft, nontender, nondistended, no organomegaly, no palpable masses.  Breasts: Her surgical site is healing well without evidence of infection.      Pertinent Results:  Lab results: 08/29/16  0741   WBC 5.4   Hemoglobin 13.6   Hematocrit 41.0   RBC 4.25   Platelets 282               Lab results: 08/29/16  0741   Sodium 140   Potassium 4.2   Chloride 104   CO2 27   UN 19   Creatinine 0.78   GFR,Caucasian 74   GFR,Black 89   Glucose 103*   Calcium 9.3   Total Protein 6.9   Albumin 4.2   ALT 19   AST 24   Alk Phos 69   Bilirubin,Total 0.6         Last Filed Vitals    03/18/17 1357   BP: 132/79    Pulse: 102   Resp: 18   Temp: 36.6 C (97.9 F)       Impression and Plan: Anticipating bilateral mastectomies with sentinel lymph node biopsy on the left followed by immediate tissue expander reconstructions by Dr. Varney Daily.  We discussed the surgery in detail including the risks and complications.  These include bleeding, infection, wound problems, skin necrosis, arm swelling, and sensory changes.  She signed consent today.  Were tentatively planning surgery for next week.  At the end of our discussion and all of her questions were answered.

## 2017-03-28 NOTE — Discharge Instructions (Signed)
DISCHARGE INSTRUCTIONS      DATE OF SURGERY: 03/28/2017    PROCEDURES: bilateral mastectomy, left axillary sentinel lymph node biopsy. Placement of bilateral tissue expander(s)    SURGEONS: Hendricks Milo, MD; Franco Nones    DIET: Resume your usual healthy diet    ACTIVITY: No strenuous exercise. It is okay to gradually increase your walking as tolerated and climb stairs. Avoid jogging, aerobics or other strenuous activity for 6 weeks to allow the breast to heal. Do not do any heavy (anything heavier than a gallon of milk) lifting or vigorous upper body exercise. No pushing/pulling. Vigorous exercise can increase the amount of fluid build up around the implant and delay drain removal.     CARE OF DRESSING OR INCISION: You may change your dressing as needed. Do not peel off steristrips (skin tape) or dermabond (skin glue). Use dry dressings as needed for comfort or drainage. You may wear a loose front-zip sports bra or camisole. Do not wear underwire bras.    BATHING/SHOWERING: You may shower beginning 2 days after your surgery. Do not have the shower directly hit your drain site or soak in a tub or pool until 2 days after the drain is removed (usually 1-2 weeks after surgery). Do not rub your sutures. Pat all of your incisions dry with a clean towel or paper towel.  No lotions or creams on your incisions.    WHAT TO EXPECT: If you had a sentinel node biopsy and blue dye was used, you may note a green or blue color to your urine or bowel movements for a day or 2. This is normal and will go away on its own.    ANTIBIOTICS: You have been prescribed antibiotics in order to prevent infection in your implant(s) and/or at your drain site(s). Please take the entire course of prescribed antibiotics.    FOLLOW UP: Please call our office at [phone] to make an appointment to schedule your post-operative follow up and subsequent tissue expansion injections. Please also contact your breast cancer surgeon and your  oncologist regarding follow up.    SMOKING: Smoking can reduce the quality of your wound healing and increase your chances of wound infections, as well as increase your chances of developing chronic health problems or worsen conditions you already have. If you smoke, you should quit. Smoking cessation information is available for your review to help you quit. Medications to help you quit are available. Ask your primary care physician if you would like to receive these medications.    DIABETES: If you are diabetic, check your blood sugar at least daily. Poor blood sugar control can lead to delayed wound healing and infections. Follow up with your primary care physician or endocrinologist to discuss your diabetic care regimen.    PAIN MANAGEMENT: Narcotic use is associated with significant adverse effects, including but not limited to nausea, constipation, itching, drowsiness, and addiction. Narcotic use should be reserved for severe pain that is not relieved by non-narcotic medications such as acetaminophen (Tylenol) and ibuprofen (Motrin, Advil), unless you are unable to take these medications for medical reasons. Do not operate heavy machinery, drive, or make important decisions while on narcotic pain medications. It is important to wean your narcotic use as soon as possible after surgery. Be careful not to exceed 3,000 mg of acetaminophen in a day from all sources (Tylenol with Codeine, Norco, Vicodin, and Percocet contain acetaminophen).    Keep a record of your narcotic use by recording it in this  chart:  Name of medication:   Date Time/dose Time/dose Time/dose Time/dose Time/dose Time/dose                                                                             NOTIFY YOUR SURGEON AT [phone] FOR:   FEVER over 101 F/38.4 C   PAIN not relieved with pain medication ordered   SWELLING, especially if asymmetrical in a bilateral procedure   REDNESS at incision site, especially if it is  spreading   BLEEDING that does not respond to 20 minutes of direct uninterrupted direct pressure   DRAINAGE that is not pink or clear   PROBLEMS WITH DRAINS that are not solved with troubleshooting instructions provided   DIFFICULTY URINATING   CONSTIPATION not relieved by over the counter medications (docusate, senna, bisacodyl, Miralax)  Urgent concerns can be addressed 24 hours a day, 7 days a week. Nonurgent concerns can be addressed during regular business hours M-F 8am-4pm.          Drain Care Instructions    Drain Care:    There is a stitch securing the drain. This will be removed when the drain is removed. If you develop drainage around the drain, keep a clean dry gauze dressing over the site. Call if you have a lot of drainage.    If you have Biopatches on your drains: please change the Biopatches and Tegederm (clear adhesive film) every 3 days (72 hours). These can be purchased at medical supply stores or online.   Milk or strip your drains a few times a day or when it looks clogged. Use one hand to tightly secure the drain where it enters your skin. Use the other hand's forefinger and thumb to squeeze the tubing and pull towards the bulb. If the tubing is long you may have to do this a few times along the length of the tubing. You should see the contents of the tubing get squeezed into the bulb.   Empty the bulbs at least two (2) times a day. If it gets full more often, empty whenever it gets full. Make note of the amount of drainage every time you empty the drain. Wash your hands before and after emptying the drain. To empty the drain, open the plug and carefully pour the contents into a measuring cup. Squeeze the bulb and close the plug. The drain should look like it does while squeezing it (like it has a dent in it). Record the total daily drainage amount in the chart below. Bring this paper to your follow-up appointment. The drain will be removed once the amount of drainage is less than 30cc  (about 1 ounce) every 24 hours for at least 2 consecutive days.   You may have been prescribed antibiotics in order to prevent infections. Please take the entire course of prescribed antibiotics.   Secure your drains prior to showering (e.g. thread a shoelace through the tabs on the JP bulbs and wear the shoelace as a necklace while showering).      Potential Problems (Troubleshooting):   The drain does not stay compressed: The drain should never look round and smooth (like a hand grenade) except when the plug is open while  emptying the drain. If it does look round and smooth, it is either full, the plug is open, or there is a leak in the system. Try emptying and resealing the bulb. If it still becomes round and smooth, call your doctors office (this is not an emergency; it can wait until working hours).    The drain suddenly stops draining or starts leaking around the drain site onto the bandage: The drainage should slowly taper off. If the drain suddenly stops collecting fluid or starts leaking at the drain site, it may be clogged. If there is an obvious blockage in the tubing, you may try to gently dislodge the blockage by squeezing and milking the tubing. Be careful not to pull on the drain as this will pull at the stitch holding it in place and will hurt. If you are unable to dislodge the blockage or you do not see any obvious blockage, call your doctors office (This is not an emergency. It can wait until working hours.)      When to call:   Call if you have bleeding, severe pain, swelling, redness around the drain site, or fever.   Call if the drain is not working properly and you have not been able to fix the problem using the instructions above.   Your drain output should look clear yellow to dark pink. Please note it and call if the color is greenish, gray, or dark red.   Call if you have any questions.        Drain output worksheet:     Date:                 Drain 1 AM                  PM                   Total                 Drain 2 AM                  PM                  Total                 Drain 3 AM                  PM                  Total                 Drain 4 AM                  PM                  Total                 Drain 5 AM                  PM                  Total                 Drain 6 AM                  PM  Total                    Signed: Ruby Cola, MD on 03/28/2017 at 11:21 PM

## 2017-03-28 NOTE — Anesthesia Preprocedure Evaluation (Signed)
Anesthesia Pre-operative History and Physical for [name]  .  Marland Kitchen  Anesthesia Evaluation Information Source: patient, records     ANESTHESIA HISTORY  Pertinent(-):  No History of anesthetic complications or Family hx of anesthetic complications    GENERAL  Pertinent (-):  No obesity     PULMONARY  Pertinent(-):  No smoking, asthma, sleep apnea or COPD    CARDIOVASCULAR  Good(4+METs) Exercise Tolerance  Pertinent(-):  No hypertension, past MI, angina or CHF    GI/HEPATIC/RENAL  Last PO Intake: >8hr before procedure and >2hr before procedure (clears)  Pertinent(-):  No GERD, liver  issues or renal issues NEURO/PSYCH  Pertinent(-):  No seizures or cerebrovascular event    ENDO/OTHER  Pertinent(-):  No diabetes mellitus, thyroid disease    HEMATOLOGIC  Pertinent(-):  No coagulopathy       Physical Exam    Airway            Mouth opening: normal            Mallampati: II            TM distance (fb): >3 FB            Neck ROM: full  Dental   Normal Exam   Cardiovascular           Rhythm: regular           Rate: normal  No murmur         Pulmonary     breath sounds clear to auscultation    No cough    Mental Status     oriented to person, place and time         ________________________________________________________________________  PLAN  ASA Score  2  Anesthetic Plan general     Induction (routine IV) General Anesthesia/Sedation Maintenance Plan (inhaled agents); Airway (LMA); Line ( use current access); Monitoring (standard ASA); Positioning (supine); PONV Plan (dexamethasone and ondansetron); Pain (per surgical team); PostOp (PACU)    Informed Consent     Risks:          Risks discussed were commensurate with the plan listed above with the following specific points: N/V and sore throat , damage to:(teeth), unexpected serious injury, awareness    Anesthetic Consent:         Anesthetic plan (and risks as noted above) were discussed with patient    Attending Attestation:  As the primary attending anesthesiologist, I  attest that the patient or proxy understands and accepts the risks and benefits of the anesthesia plan. I also attest that I have personally performed a pre-anesthetic examination and evaluation, and prescribed the anesthetic plan for this particular location within 48 hours prior to the anesthetic as documented. Elvera Lennox, MD 8:11 AM

## 2017-03-28 NOTE — INTERIM OP NOTE (Signed)
Interim Op Note (Surgical Log ID: [id number])       Date of Surgery: 03/28/2017       Surgeons: Juliann Mule) and Role:  Panel 2:     * [name], MD - Primary   Assistant: [facility], [place]     Pre-op Diagnosis: Pre-Op Diagnosis Codes:     * 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.12]     * Encounter to discuss breast reconstruction [Z71.89]       Post-op Diagnosis: Post-Op Diagnosis Codes:     * 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.12]     * Encounter to discuss breast reconstruction [Z71.89]       Procedure(s) Performed:   Procedure:    BILATERAL MASTECTOMY  CPT(R) Code:  63149 - PR MASTECTOMY, SIMPLE, COMPLETE    Procedure:    AXILLARY LYMPH NODE BIOPSY OR DISSECTION  CPT(R) Code:  70263 - PR BX/REMV,LYMPH NODE,DEEP AXILL         Additional CPT Codes: 78588: Chg Lymphatics & Lymph Glands Imaging;         Anesthesia Type: General        Fluid Totals: I/O this shift:  02/14 0700 - 02/14 1459  In: 1000 (13.3 mL/kg) [I.V.:1000]  Out: - (0 mL/kg)   Net: 1000  Weight: 75.3 kg        Estimated Blood Loss: * No values recorded between 03/28/2017  8:21 AM and 03/28/2017 12:50 PM *       Specimens to Pathology:  ID Type Source Tests Collected by Time Destination   A : Right breast phrophylactic mastectomy short stitch superior long stitch lateral  TISSUE Breast SURGICAL PATHOLOGY Sampson Goon, RN 03/28/2017 0945    B : Left breast total mastectomy short stitch superior long stitch lateral prior biopsy for atypical hyperplasia TISSUE Breast SURGICAL PATHOLOGY Sampson Goon, RN 03/28/2017 1043    C : Left axillary sentinel lymph node #1 TISSUE Lymph node SURGICAL PATHOLOGY Sampson Goon, RN 03/28/2017 1045           Temporary Implants: Expander Tissue Mod Style 154mv 600cc; Removal Plan: Exchange For Final Implant ; (Anticipated Removal Date:07/26/2017);     Expander Tissue Mod Style 171mv 600cc; Removal Plan: Exchange For Final Implant ; (Anticipated Removal Date:07/26/2017);             Packing:                 Patient Condition: Still in surgery       Findings (Including unexpected complications): none     Signed:  Ashima Shrake, PA  on 03/28/2017 at 12:50 PM

## 2017-03-28 NOTE — Anesthesia Procedure Notes (Signed)
---------------------------------------------------------------------------------------------------------------------------------------    AIRWAY   GENERAL INFORMATION AND STAFF    Patient location during procedure: OR  CONDITION PRIOR TO MANIPULATION     Current Airway/Neck Condition:  Normal        For more airway physical exam details, see Anesthesia PreOp Evaluation  AIRWAY METHOD     Patient Position:  Sniffing    Preoxygenated: yes      Induction: IV  Mask Difficulty Assessment:  1 - vent by mask    Number of Attempts at Approach:  1  FINAL AIRWAY DETAILS    Final Airway Type:  LMA    Final LMA: Unique    LMA Size: 4  ----------------------------------------------------------------------------------------------------------------------------------------

## 2017-03-28 NOTE — Interval H&P Note (Signed)
UPDATES TO PATIENT'S CONDITION on the DAY OF SURGERY/PROCEDURE    I. Updates to Patient's Condition (to be completed by a provider privileged to complete a H&P, following reassessment of the patient by the provider):    Day of Surgery/Procedure Update:  History  History reviewed and no change    Physical  Physical exam updated and no change            II. Procedure Readiness   I have reviewed the patient's H&P and updated condition. By completing and signing this form, I attest that this patient is ready for surgery/procedure.    III. Attestation   I have reviewed the updated information regarding the patient's condition and it is appropriate to proceed with the planned surgery/procedure.    Tanav Orsak Ubaldo Glassing, MD as of 8:19 AM 03/28/2017

## 2017-03-29 MED ORDER — CEPHALEXIN 500 MG PO CAPS *I*
500.0000 mg | ORAL_CAPSULE | Freq: Four times a day (QID) | ORAL | 0 refills | Status: AC
Start: 2017-03-29 — End: 2017-04-03

## 2017-03-29 MED ORDER — OXYCODONE HCL 5 MG PO TABS *I*
5.0000 mg | ORAL_TABLET | ORAL | 0 refills | Status: DC | PRN
Start: 2017-03-29 — End: 2017-07-25

## 2017-03-29 MED ORDER — DEXTROSE 5 % FLUSH FOR PUMPS *I*
0.0000 mL/h | INTRAVENOUS | Status: DC | PRN
Start: 2017-03-29 — End: 2017-03-29

## 2017-03-29 MED ORDER — SODIUM CHLORIDE 0.9 % FLUSH FOR PUMPS *I*
0.0000 mL/h | INTRAVENOUS | Status: DC | PRN
Start: 2017-03-29 — End: 2017-03-29

## 2017-03-29 NOTE — Progress Notes (Signed)
JP drain teach completed with patient. Patient demonstrated understanding. Nursing will continue to monitor and support patient during hospital stay.     Wendie Chess, RN

## 2017-03-29 NOTE — Progress Notes (Signed)
Discharge instructions reviewed with patient and patient demonstrated understanding. JP teaching was done. Peripheral IV was removed. Patient discharged home per order.     Wendie Chess, RN

## 2017-03-29 NOTE — Anesthesia Postprocedure Evaluation (Signed)
Anesthesia Post-Op Note    Patient: [name]    Procedure(s) Performed:  Procedure Summary  Date:  03/28/2017 Anesthesia Start: 03/28/2017  8:21 AM Anesthesia Stop: 03/28/2017 12:57 PM Room / Location:  H_OR_[id number] / [place]   Procedure(s):  BILATERAL IMMEDIATE RECONSTRUCTION OF BREASTS WITH TISSUE EXPANDER INSERTION AND ACELLULAR DERMAL MATRIX  BILATERAL MASTECTOMY  AXILLARY LYMPH NODE BIOPSY OR DISSECTION Diagnosis:  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.12]  Encounter to discuss breast reconstruction [Z71.89] Surgeon(s):  [name], MD  Hendricks Milo, MD  Hendricks Milo, MD  Manning Charity, MD Attending Anesthesiologist:  Elvera Lennox, MD         Recovery Vitals  BP: 112/58 (03/29/2017  5:10 AM)  Heart Rate: 84 (03/29/2017  5:10 AM)  Heart Rate (via Pulse Ox): 76 (03/28/2017  2:00 PM)  Resp: 14 (03/29/2017  5:10 AM)  Temp: 37.3 C (99.1 F) (03/29/2017  5:10 AM)  SpO2: 94 % (03/29/2017  5:10 AM)  O2 Flow Rate: 2 L/min (03/28/2017  3:06 PM)   0-10 Scale: 0 (03/29/2017  5:10 AM)  Anesthesia type:  General  Complications Noted During Procedure or in PACU:  None   Comment:    Patient Location:  [facility] Surgical Floor  Level of Consciousness:    Recovered to baseline  Patient Participation:     Able to participate  Temperature Status:    Normothermic  Oxygen Saturation:    Within patient's normal range  Cardiac Status:   Within patient's normal range  Fluid Status:    Stable  Airway Patency:     Yes  Pulmonary Status:    Baseline  Pain Management:    Adequate analgesia  Nausea and Vomiting:  None    Post Op Assessment:    Tolerated procedure well   Attending Attestation:  All indicated post anesthesia care provided     -

## 2017-03-29 NOTE — Progress Notes (Signed)
Surgery Progress Note  [name]  [id number]    Subjective  [name] is a 67 y.o. female who is POD 1 from bilateral mastectomy for DCIS for bilateral tissue expanders.     Feeling well. Nervous about going home. Pain tolerable.    Objective   has a past medical history of Anxiety; Arthritis; Depression; Ductal carcinoma in situ (DCIS) of left breast; Hypothyroidism; and Mitral valve prolapse.    Temp:  [36 C (96.8 F)-37.3 C (99.1 F)] 36.1 C (97 F)  Heart Rate:  [76-95] 80  Resp:  [10-16] 16  BP: (112-151)/(58-116) 114/76    No results for input(s): WBC, HGB, HCT, PLT, INR in the last 72 hours.    No components found with this basename: APTT  No results for input(s): NA, K, CL, CO2, UN, CREAT, GLU, CA, MG, PO4 in the last 72 hours.  No results for input(s): AST, ALT, ALK, ALB, LAC in the last 72 hours.    No components found with this basename: TBILI    I/O last 3 completed shifts:  02/14 0700 - 02/15 0659  In: 3907.5 (51.9 mL/kg) [P.O.:120; I.V.:3787.5 (2.1 mL/kg/hr)]  Out: 2155 (28.6 mL/kg) [Urine:1750 (1 mL/kg/hr); Drains:305; Blood:100]  Net: 1752.5  Weight: 75.3 kg     Scheduled Meds:   levothyroxine  112 mcg Oral Daily    heparin (porcine)  5,000 Units Subcutaneous Q8H    sodium chloride  3 mL Intravenous Q8H    acetaminophen  1,000 mg Oral Q8H    docusate sodium  100 mg Oral Daily     Continuous Infusions:  PRN Meds:.sodium chloride, dextrose, melatonin, oxyCODONE **OR** oxyCODONE, ondansetron     Physical Examination:  Gen: no issues  Heart: RRR  Lung: CTAB  Abd: soft, NT/ND  GU: voiding  Ext: wwp no cce    Assessment  [name] is a 67 y.o. female who is POD 1 from bilateral mastectomy for DCIS    Plan  -- Home when ready  -- Defer care to plastic surgery team    Rudene Re, MD  03/29/2017  9:28 AM

## 2017-03-29 NOTE — Plan of Care (Signed)
Problem: Safe Discharge Barriers  Goal: Safe Discharge  Outcome: Completed or Resolved Date Met: 03/29/17    Intervention: Homecare Services  Pt declined a home care referral.  Intervention: Caregiver Available at D/C  Pt states her husband can help including drain care and he will receive teaching here before she goes home. Pt reports sisters and mother-in-law who want to help at home as well.     M , LMSW Pager [phone]  03/29/2017 10:39 AM

## 2017-03-29 NOTE — Op Note (Signed)
Operative Note (Surgical Case/Log ID: [id number])       Date of Surgery: 03/28/2017       Surgeons: Juliann Mule) and Role:  Panel 1:     * Hendricks Milo, MD - Primary     * Dorette Hartel, Celene Skeen, MD - Primary     * Manning Charity, MD - Resident - Assisting    Panel 2:     [name], MD - Primary       Pre-op Diagnosis: Pre-Op Diagnosis Codes:     * 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.12]     * Encounter to discuss breast reconstruction [Z71.89]       Post-op Diagnosis: Post-Op Diagnosis Codes:     * 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.12]     * Encounter to discuss breast reconstruction [Z71.89]       Procedure(s) Performed: Procedure:    BILATERAL IMMEDIATE RECONSTRUCTION OF BREASTS WITH TISSUE EXPANDER INSERTION AND ACELLULAR DERMAL MATRIX  CPT(R) Code:  08657 - PR INSERT BREAST PROS IMMED AFTER EXCIS    Procedure:    BILATERAL MASTECTOMY  CPT(R) Code:  84696 - PR MASTECTOMY, SIMPLE, COMPLETE    Procedure:    AXILLARY LYMPH NODE BIOPSY OR DISSECTION  CPT(R) Code:  29528 - PR BX/REMV,LYMPH NODE,DEEP AXILL         Additional CPT Codes: 41324: Chg Lymphatics & Lymph Glands Imaging;         Anesthesia Type: General        Fluid Totals: I/O this shift:  02/15 0700 - 02/15 1459  In: - (0 mL/kg)   Out: 475 (6.3 mL/kg) [Urine:400; Drains:75]  Net: -475  Weight: 75.3 kg        Estimated Blood Loss: 100 mL       Specimens to Pathology:  ID Type Source Tests Collected by Time Destination   A : Right breast phrophylactic mastectomy short stitch superior long stitch lateral  TISSUE Breast SURGICAL PATHOLOGY Sampson Goon, RN 03/28/2017 0945    B : Left breast total mastectomy short stitch superior long stitch lateral prior biopsy for atypical hyperplasia TISSUE Breast SURGICAL PATHOLOGY Sampson Goon, RN 03/28/2017 1043    C : Left axillary sentinel lymph node #1 TISSUE Lymph node SURGICAL PATHOLOGY Sampson Goon, RN 03/28/2017 1045           Temporary Implants: Expander Tissue Mod  Style 157mv 600cc; Removal Plan: Exchange For Final Implant ; (Anticipated Removal Date:07/26/2017);     Expander Tissue Mod Style 144mv 600cc; Removal Plan: Exchange For Final Implant ; (Anticipated Removal Date:07/26/2017);            Packing:                 Patient Condition: good       Indications: S/p bilateral mastectomy desires reconstruction       Findings (Including unexpected complications): same     Description of Procedure: The patient was taken to the operating room and placed supine on the operating table. Adequate general anesthesia was administered. Bilateral simple mastectomy were performed by Dr. Bennie Hind and her part will be dictated under separate cover. When we entered the room, we evaluated the operative field and decided to proceed with reconstruction. Reconstruction was performed on the left side:  The pectoralis major muscle was elevated off of the chest wall at the inferior attachment and for a limited distance along the sternum. A submuscular pocket was thus  created for the implant. This pocket was made hemostatic with cautery and irrigated with antibiotic irrigation. A piece of thin Cortiva acellular dermal matrix (16 x 8 cm) was prepared in the usual manner and secured to the pectoralis muscle, along the sternal edge and to a point about a cm below the inframammary fold. 2-0 PDS suture was used for this in a running manner. The implant was taken out of the box, cleansed with antibiotic irrigation, deaired, and inflated to 50 cc. The implant was oriented properly and inserted into the submuscular pocket and then inflated to a total of 300 cc. This particular implant was an Allergan 133 MC 600 cc. . . The skin edges were inspected and trimmed where vascularity was questionable. Further irrigation was done. Powdered vanco was placed in each pocket. The skin flaps were then closed in layers wth 3 and 4-0 monocryl. We performed the same operation on the patients right side . Total fill volume  was the same at 300 cc, so that the cortiva was snug against the lower mastectomy flap. On the right side we also used a 600 cc tissue expander. Dermabond was applied to both sides. Dressings were applied and the patient was taken out of the OR to the RR where she was received in stable condition. The instrument and needle counts were reported as negative. There were no untoward events. She tolerated the procedure well.    Signed:  Hendricks Milo, MD  on 03/29/2017 at 11:28 AM     CC: Moreen Fowler, MD

## 2017-03-29 NOTE — Progress Notes (Signed)
,  Interdisciplinary Rounding Note   [name] was discussed today during health team rounds.   Problems addressed:  Active Problems:    Ductal carcinoma in situ (DCIS) of left breast    Encounter to discuss breast reconstruction    Breast cancer, left      The plan of care was reviewed with the following health team members:  Charge Nurse  Social Worker  Physical Therapist  Care Coordinator    Julian Reil, [place] 03/29/2017 9:49 AM

## 2017-03-29 NOTE — Progress Notes (Signed)
Progress Note     Subjective: No acute events overnight. States that she is feeling rather sore and has been trying to not take the narcotic pain medication. Feels like she would like to go home but is nervous.      Physical Exam:    Temp:  [36 C (96.8 F)-37.3 C (99.1 F)] 37.3 C (99.1 F)  Heart Rate:  [76-95] 84  Resp:  [10-14] 14  BP: (112-151)/(58-116) 112/58  GEN: No apparent distress  PULM: nonlabored respirations  ABD: soft, nondistended  NEURO/MOTOR: oriented, no focal deficits  VASCULAR: feet wwp  BREAST: Bilateral incisions c/d/i without evidence of fluid collection or hematoma. Minimal bilateral ecchymosis with no surrounding erythema. Tender to palpation. JP drains x4 with appropriate SS output.     I/O last 3 completed shifts:  02/14 0700 - 02/15 0659  In: 3907.5 (51.9 mL/kg) [P.O.:120; I.V.:3787.5 (2.1 mL/kg/hr)]  Out: 2155 (28.6 mL/kg) [Urine:1750 (1 mL/kg/hr); Drains:305; Blood:100]  Net: 1752.5  Weight: 75.3 kg      Laboratory values:    No results for input(s): WBC, HGB, HCT, PLT, PTT, INR in the last 72 hours. No results for input(s): NA, K, CL, CO2, UN, CREAT, GLU, CA, MG, PO4, ALB, PALB, ESR, CRP in the last 72 hours.     Recent Labs      03/28/17   1259  03/28/17   0715   Glucose POCT  136*  102*        Imaging: No results found.     Impression: [name] is a 67 y.o. female with h/o  DCIS left breast now 1 Day Post-Op from bilateral mastectomy with left axillary sentinel node bx and placement of bilateral tissue expanders.    Plan:   Ancef while inpatient- will send home with course of keflex   Analgesia PRN with tylenol, motrin, Oxy   DVT ppx: HSQ, SCDs, ambulation   JP drain teaching   Strip and record drain output q 8 hours   Surgical bra at all times    Dispo: will prepare for home today     Ruby Cola, MD   Division of Plastic and Reconstructive Surgery

## 2017-04-01 ENCOUNTER — Encounter: Payer: Self-pay | Admitting: Surgery

## 2017-04-02 ENCOUNTER — Telehealth: Payer: Self-pay | Admitting: Surgery

## 2017-04-02 LAB — SURGICAL PATHOLOGY

## 2017-04-02 NOTE — Telephone Encounter (Signed)
[name] had surgery last week and she is concerned with the way the ara by her drain looks. It's swollen and looks bruised she wants to make sure this is normal or if she should have this checked out. Her phone number is [phone]

## 2017-04-02 NOTE — Telephone Encounter (Signed)
Spoke with Patty s/p bilateral mastectomy with TE placement on 2/12. She reports bruising and swelling at her right drain insertion site. She states the drain is working and she is putting out around 20-15cc's twice daily. She denies uncontrollable pain or redness. Reassurance provided. She has taken a photograph and I instructed her I would look at this through e-mail and let her know if there is anything concerning. She was instructed to contact the office prior to her next appointment with any further problems, questions, or concerns.

## 2017-04-05 ENCOUNTER — Ambulatory Visit: Payer: Medicare (Managed Care) | Admitting: Surgical Oncology

## 2017-04-05 ENCOUNTER — Encounter: Payer: Self-pay | Admitting: Surgical Oncology

## 2017-04-05 VITALS — BP 138/70 | HR 90 | Temp 97.9°F | Wt 167.1 lb

## 2017-04-05 DIAGNOSIS — D0512 Intraductal carcinoma in situ of left breast: Secondary | ICD-10-CM

## 2017-04-07 ENCOUNTER — Encounter: Payer: Self-pay | Admitting: Surgical Oncology

## 2017-04-07 NOTE — Progress Notes (Signed)
Subjective:      [name] presents at [place] at [facility] about 10 days after bilateral total mastectomy with immediate tissue expander reconstruction. She is doing relatively well overall but her drains are making her uncomfortable. She has not had any complications.   Objective:     Vitals: BP 138/70    Pulse 90    Temp 36.6 C (97.9 F) (Temporal)    Wt 75.8 kg (167 lb 1.7 oz)    SpO2 100%    BMI 31.57 kg/m     General:  alert, appears stated age and cooperative   Axillary Incision: NA   Breast Incision: Both sides intact without necrosis, erythema or breakdown.        Pathology: Additional 3 cm of DCIS, node negative, no microinvasion    Labs:    Lab Results   Component Value Date/Time    VID25 17 (L) 03/01/2010 08:38 AM       Assessment and Plan:   Doing well, no evidence of surgical complications. Continue routine management per Dr. Varney Daily. Pathology showed pure DCIS, low grade. Posterior margin was close but not positive so no indication for additional treatment. She should follow up with me in 1 year. Discussed recommendations for ongoing imaging- no survival benefit, on going imaging of implants per Dr. Varney Daily, breast cancer screening can continue if she prefers but it would need to be with MRI. Will discuss again at next visit. Call with questions or concerns in interval.

## 2017-04-08 ENCOUNTER — Encounter: Payer: Self-pay | Admitting: Gastroenterology

## 2017-04-08 ENCOUNTER — Ambulatory Visit: Payer: Medicare (Managed Care) | Attending: Surgical Oncology | Admitting: Surgery

## 2017-04-08 DIAGNOSIS — Z7189 Other specified counseling: Secondary | ICD-10-CM

## 2017-04-08 DIAGNOSIS — Z09 Encounter for follow-up examination after completed treatment for conditions other than malignant neoplasm: Secondary | ICD-10-CM

## 2017-04-08 NOTE — Patient Instructions (Signed)
1- Take Ibuprofen 600 mg every 8 hours for pain.  You may take Tylenol 1000 mg every 8 hours, staged 4 hours after Ibuprofen for additional pain relief.  Please take both with food.    2- F/u in 2 weeks for expansion.

## 2017-04-08 NOTE — Progress Notes (Signed)
HPI:  [name] is 11 days s/p immediate reconstruction of bilateral breasts with TE/ADM. Presenting for routine follow-up.  She is doing well does note she is emotional and uncomfortable.  She is only taking Tylenol ES, last narcotic use a couple days ago.  She has had a BM.       PE: Pleasant woman in NAD.  Incision to bilateral breast reconstructions C/D/I.  No evidence of fluid collection, infection or delayed healing.  JP x 4 intact with s/s fluid.      Tissue Expander Record  Name: [name]  Surgery Date: 03/28/17  Expander Style: 133 MV  Size: 600 cc  Date (.td) Left Volume Total Volume Right Volume Total Volume   03/28/17 +300 cc 300 cc +300 cc 300 cc                                                                    A:  11 days s/p immediate reconstruction of bilateral breasts with TE/ADM, healing well.    P: JP drains removed.  She was instructed to hold off on showering for the next 24 hrs and take it easy for the next 72 hrs. She will limit activities to ADLs only for another week and ease back into additional activity thereafter.  Supportive bra at all times.  We reviewed pain management recommendations of Ibuprofen 600 mg every 8 hours, staged 4 hours later by Tylenol ES 1000 mg every 8 hours.  Patient to return to our office in 2 weeks for expansion.  She will contact the office with any questions or concerns in the interim.

## 2017-04-11 ENCOUNTER — Telehealth: Payer: Self-pay | Admitting: Surgery

## 2017-04-11 NOTE — Telephone Encounter (Signed)
[name] called looking to see if she will be able to drive to her PCP's office she has bronchitis and feels she might have an infection. Her phone number is [phone]

## 2017-04-11 NOTE — Telephone Encounter (Signed)
I spoke with [name] and she believes she has bronchitis, trying to get in to see her PCP today.  She is off narcotic and drains out for several days now.  She was given the ok to drive to her PCP's office if she doesn't have someone else to drive her.  She should limit her activity to that only today.

## 2017-04-24 ENCOUNTER — Ambulatory Visit: Payer: Medicare (Managed Care) | Attending: Surgical Oncology | Admitting: Surgery

## 2017-04-24 DIAGNOSIS — D0512 Intraductal carcinoma in situ of left breast: Secondary | ICD-10-CM

## 2017-04-24 DIAGNOSIS — Z9889 Other specified postprocedural states: Secondary | ICD-10-CM

## 2017-04-24 DIAGNOSIS — Z09 Encounter for follow-up examination after completed treatment for conditions other than malignant neoplasm: Secondary | ICD-10-CM

## 2017-04-24 DIAGNOSIS — Z7189 Other specified counseling: Secondary | ICD-10-CM

## 2017-04-24 NOTE — Progress Notes (Addendum)
HPI:  [name] is 1 month s/p immediate reconstruction of bilateral breasts with TE/ADM. Presenting for routine follow-up.  She recently lost her sister in law and nephew.  Feeling more comfortable at surgical sites.  Not taking any pain medication.       PE: Pleasant woman in NAD.  Incision to bilateral breast reconstructions well healed.  No evidence of fluid collection, infection or delayed healing.  TE in excellent position and symmetric.     Tissue Expander Record  Name: [name]  Surgery Date: 03/28/17  Expander Style: 133 MV  Size: 600 cc  Date (.td) Left Volume Total Volume Right Volume Total Volume   03/28/17 +300 cc 300 cc +300 cc 300 cc   04/24/17 +60 cc 360 cc +120 cc 420 cc                                                             Bilateral integrated ports located and confirmed on magnet.  Skin marked.  Skin prepped with Betadine and ports accessed and instilled with 60cc on left and 120 cc on right.  Final fill volumes 360 cc on left and 420 cc on right.  She tolerated procedure well.       A:  1 mos s/p immediate reconstruction of bilateral breasts with TE/ADM, healing well.    P:  Expansion performed today.  Supportive bra at all times.  Patient to return to our office in 2 weeks for expansion.  She will contact the office with any questions or concerns in the interim.

## 2017-05-08 ENCOUNTER — Ambulatory Visit: Payer: Medicare (Managed Care) | Attending: Surgery | Admitting: Surgery

## 2017-05-08 DIAGNOSIS — Z09 Encounter for follow-up examination after completed treatment for conditions other than malignant neoplasm: Secondary | ICD-10-CM

## 2017-05-08 DIAGNOSIS — Z7189 Other specified counseling: Secondary | ICD-10-CM

## 2017-05-08 DIAGNOSIS — Z9889 Other specified postprocedural states: Secondary | ICD-10-CM

## 2017-05-08 DIAGNOSIS — D0512 Intraductal carcinoma in situ of left breast: Secondary | ICD-10-CM

## 2017-05-08 NOTE — Progress Notes (Signed)
HPI:  [name] is 1 month s/p immediate reconstruction of bilateral breasts with TE/ADM. Presenting for routine follow-up.  She recently lost her sister in law and nephew.  Feeling more comfortable at surgical sites.  Not taking any pain medication.       PE: Pleasant woman in NAD.  Incision to bilateral breast reconstructions well healed.  No evidence of fluid collection, infection or delayed healing.  TE in excellent position and symmetric.     Tissue Expander Record  Name: [name]  Surgery Date: 03/28/17  Expander Style: 133 MV  Size: 600 cc  Date (.td) Left Volume Total Volume Right Volume Total Volume   03/28/17 +300 cc 300 cc +300 cc 300 cc   04/24/17 +60 cc 360 cc +120 cc 420 cc   05/08/17 +120 cc 480 cc + 60 cc 480 cc                                                      Bilateral integrated ports located and confirmed on magnet.  Skin marked.  Skin prepped with Betadine and ports accessed and instilled with 60cc on left and 120 cc on right.  Final fill volumes 360 cc on left and 420 cc on right.  She tolerated procedure well.       A:  1 mos s/p immediate reconstruction of bilateral breasts with TE/ADM, healing well.    P:  Expansion performed today.  Supportive bra at all times.  Patient to return to our office in 2 weeks for expansion.  She will contact the office with any questions or concerns in the interim.

## 2017-05-08 NOTE — Progress Notes (Signed)
HPI:  Ms. [name] is 6 weeks s/p immediate reconstruction of bilateral breasts with TE/ADM. Presenting for routine follow-up.  Notes little discomfort after last expansion.  Would like more volume as she isn't filling out bra yet.       PE: Pleasant woman in NAD.  Incision to bilateral breast reconstructions well healed.  No evidence of fluid collection, infection or delayed healing.  TE in excellent position and symmetric.     Tissue Expander Record  Name: FLEUR AUDINO  Surgery Date: 03/28/17  Expander Style: 133 MV  Size: 600 cc  Date (.td) Left Volume Total Volume Right Volume Total Volume   03/28/17 +300 cc 300 cc +300 cc 300 cc   04/24/17 +60 cc 360 cc +120 cc 420 cc   05/08/17 +120 cc 480 cc + 60 cc 480 cc                                                      Bilateral integrated ports located and confirmed on magnet.  Skin marked.  Skin prepped with Betadine and ports accessed and instilled with 60cc on right and 120 cc on left.  Final fill volumes 480 cc bilat.  She tolerated procedure well.       A:  6 weeks s/p immediate reconstruction of bilateral breasts with TE/ADM, healing well.    P:  Expansion performed today. May require one more round.  Supportive bra at all times.  Patient to return to our office in 2 weeks for additional expansion possibly and discussion of next stage of reconstruction.  She is leaving for her son's wedding in [place] and will return 6/10, so exchange will be after that.  She will contact the office with any questions or concerns in the interim.

## 2017-05-22 ENCOUNTER — Encounter: Payer: Medicare (Managed Care) | Admitting: Surgery

## 2017-05-27 ENCOUNTER — Ambulatory Visit: Payer: Medicare (Managed Care) | Attending: Surgery | Admitting: Surgery

## 2017-05-27 DIAGNOSIS — Z7189 Other specified counseling: Secondary | ICD-10-CM

## 2017-05-27 DIAGNOSIS — D0512 Intraductal carcinoma in situ of left breast: Secondary | ICD-10-CM

## 2017-05-27 DIAGNOSIS — Z9889 Other specified postprocedural states: Secondary | ICD-10-CM

## 2017-05-29 ENCOUNTER — Other Ambulatory Visit: Payer: Self-pay | Admitting: Surgery

## 2017-05-29 DIAGNOSIS — Z9889 Other specified postprocedural states: Secondary | ICD-10-CM

## 2017-05-29 NOTE — Progress Notes (Signed)
HPI:  [name] is 8 weeks s/p immediate reconstruction of bilateral breasts with TE/ADM. Presenting for routine follow-up.  Notes little discomfort after last expansion.  Would like a little more volume.       PE: Pleasant woman in NAD.  Incision to bilateral breast reconstructions well healed.  No evidence of fluid collection, infection or delayed healing.  TE in excellent position and symmetric.     Tissue Expander Record  Name: [name]  Surgery Date: 03/28/17  Expander Style: 133 MV  Size: 600 cc  Date (.td) Left Volume Total Volume Right Volume Total Volume   03/28/17 +300 cc 300 cc +300 cc 300 cc   04/24/17 +60 cc 360 cc +120 cc 420 cc   05/08/17 +120 cc 480 cc + 60 cc 480 cc   05/27/17 +120 cc 600 cc +120 cc 600 cc                                               Bilateral integrated ports located and confirmed on magnet.  Skin marked.  Skin prepped with Betadine and ports accessed and instilled with 60cc on right and 120 cc on left.  Final fill volumes 600 cc bilat.  She tolerated procedure well.       A: 8 weeks s/p immediate reconstruction of bilateral breasts with TE/ADM, healing well.    P:  Expansion performed today. She has completed expansion process at this time.  Supportive bra at all times.  We reviewed the next phase of reconstruction to include implant exchange and discussed the differences between saline and silicone implants.  We reviewed the manufacturer's recommendations for MRI surveillance of silicone implants 3 years after implantation then every 2 years thereafter.  She was provided educational material on silicone implants for her to review and will call Nevin Bloodgood when she is ready to schedule and with her implant choice.  She will bring this paperwork back in if she elects to have silicone implants.  She is leaving for her son's wedding in [place] and will return 6/10, so exchange will be after that.  We will plan to see her back the day before her scheduled surgery.  She will contact  the office with any questions or concerns in the interim.

## 2017-05-31 DIAGNOSIS — Z9889 Other specified postprocedural states: Secondary | ICD-10-CM | POA: Insufficient documentation

## 2017-06-27 ENCOUNTER — Encounter: Payer: Self-pay | Admitting: Surgery

## 2017-06-28 ENCOUNTER — Telehealth: Payer: Self-pay | Admitting: Surgery

## 2017-06-28 NOTE — Telephone Encounter (Signed)
Tammee was returning a call back her phone number is [phone]

## 2017-06-28 NOTE — Telephone Encounter (Signed)
I spoke with [name] regarding her concerns.  She still would like to move forward with her surgery date in June.  She will give [place] a call next Thursday to discuss things further if she wishes.

## 2017-07-04 ENCOUNTER — Telehealth: Payer: Self-pay | Admitting: Surgery

## 2017-07-04 ENCOUNTER — Encounter: Payer: Self-pay | Admitting: Surgery

## 2017-07-04 NOTE — Telephone Encounter (Signed)
Alayasia was asked to give Matthias Hughs a call today. Her phone number is [phone]

## 2017-07-04 NOTE — Telephone Encounter (Signed)
I spoke with [name].  She has decided to proceed with silicone implants upon exchange.  She was encouraged to call the office with any questions/concerns.

## 2017-07-22 NOTE — Progress Notes (Signed)
Pre-Operative Instructions              FOLLOW YOUR SURGEON'S INSTRUCTIONS IF DIFFERENT THAN BELOW.                    PRIOR TO SURGERY 6/8    Five days before surgery, please STOP taking if surgeon does not specify:   Anti-inflammatory medications (Ibuprofen, Motrin, Advil, Mobic, Meloxicam, Aleve, Naproxen, Voltaren, etc.   Vitamins and herbal supplements, including herbal teas      YOU MAY TAKE ACETAMINOPHEN (TYLENOL) as needed   Directions regarding your prescribed blood thinners, including aspirin, should be approved by your cardiologist or prescribing doctor.    DAY BEFORE SURGERY 6/12     Call [phone] between 2PM and 4PM to receive your arrival/surgery time.  Call Friday afternoon if your surgery is on a Monday.     Please arrange for transportation to and from the hospital. You must have a responsible adult to stay with you after your surgery.     Do not eat anything after midnight the night before your surgery (including candy or gum).  You are encouraged to consume clear liquids such as Gatorade, clear apple juice or water up to 2 hours before surgery.                                                                                           DAY OF SURGERY 6/13    Notify staff of any recent infection or signs and symptoms of infection: Cough, fever, etc.     DO NOT CONSUME FOOD OF ANY KIND.     Gatorade, clear apple juice,or water are encouraged from midnight until 2 hours before your scheduled surgery.     Please bring photo identification and your insurance card with you for registration.     If you are female, please be prepared to provide a urine sample on arrival to the preoperative area unless you are one year post menopause or have had a hysterectomy.      DO NOT WEAR: JEWELRY, MAKEUP, DARK NAIL POLISH, HAIR PINS, BODY LOTION OR SCENTS.       Please understand that rings and body piercings should be removed prior to surgery.   If they are not removed, your surgery is at risk of  being delayed or cancelled.       Please do not bring valuables/electronics/cash with you on the day of surgery. [facility] is not responsible for damage/loss/theft of these items. Thank you.     If wearing eyeglasses, please bring a case.  DO NOT WEAR CONTACT LENSES.     You may shower, brush your teeth, and use deodorant.     If you have been instructed, please perform your skin prep as directed.      Please bring any medical supplies or equipment requested by your surgeon.     Patients are permitted 2 visitors at a time.   All visitors must be 94 yrs of age or older.    MEDICATIONS: DAY OF SURGERY 6/13     Take your medications as directed according to your printed,  verbal or MyChart instructions.   Anxiety and pain medications may be taken as prescribed at any time prior to arrival.    Medications from the [facility] will be administered to you under the direction of your surgeon. Please leave your prescriptions at home.      AT [place] in the Main Ramp garage.  Enter the building through the [employer].       Please stop at the Information Desk so that they may direct you to [facility] [address].     Leave your belongings in the car and your visitors may bring them to your room after surgery.    [place]    As a convenience, prior to your discharge we will provide any discharge medications you require.      The pharmacy is open from 9am to 5:30pm on weekdays and 10am-2pm on Saturdays.     If you will be alone on the day of surgery and want to leave with your prescribed medication, you may:     Bring a check made out to [facility]   Use a credit card to pay for your prescription   Have your family call the pharmacy with a credit card number   Only bring cash to the hospital as a last resort. Prescriptions cannot be filled without payment. Thank you for your  consideration.    QUESTIONS?     Question about these instructions? Call [phone], select option 2, and leave a message for a nurse to return your call.   Any questions regarding specifics about your surgery or recovery? Please call your surgeons office.

## 2017-07-22 NOTE — Plan of Care (Signed)
Problem: Knowledge deficit related to pre or post-op regimens  Goal: Patient verbalizes understanding of teaching  Outcome: Completed or Resolved Date Met: 07/22/17

## 2017-07-24 ENCOUNTER — Ambulatory Visit: Payer: Medicare (Managed Care) | Attending: Surgery | Admitting: Surgery

## 2017-07-24 DIAGNOSIS — Z7189 Other specified counseling: Secondary | ICD-10-CM

## 2017-07-24 NOTE — Progress Notes (Addendum)
Office Visit Note    Visit date: 07/24/2017     Interval History:   TEMARI SCHOOLER is a 67 y.o. female who returns to the [facility] today preoperative evaluation. Pain is well controlled. No complaints.     Exam:   There were no vitals taken for this visit.   There is no height or weight on file to calculate BMI.   Gen: NAD  Resp: non labored on RA  CV: rr  Breasts: bilateral reconstructed breasts well healed and well expanded.     Assessment/Plan: TANIAH REINECKE is a 67 y.o. female s/p bilateral breast reconstruction with tissue expander based reconstruction now ready for exchange for permanent implants.    - Plan for OR tomorrow with Dr. Varney Daily for breast reconstruction with exchange for permanent implants and fat grafting.   - IPPOC reviewed with the patient, will be signed in pre-an tomorrow.   - Patient was seen and evaluated with Dr. Varney Daily.     Signed:  Hendricks Limes, MD on 07/24/2017 as of 2:53 PM I saw and evaluated the patient. I have reviewed and edited the resident's/fellow's note and confirm the findings and plan of care as documented above.    Hendricks Milo, MD 07/25/2017 8:48 AM

## 2017-07-24 NOTE — Invasive Procedure Plan of Care (Signed)
Invasive Procedure Plan of Care (Consent Form 419):   Condition(s) Addressed: S/p implant based breast reconstruction, now with asymmetry   Performing Provider: Hendricks Milo, and Resident/s   Side: Bilateral    Procedure: Revision breast reconstruction with implant exchange   Special Equipment: none   Planned Anesthesia: General   Benefits: Improved symmetry and contour    Risks: Infection, pain, bleeding, scar, seroma, hematoma, asymmetry, delayed wound healing, injury to surrounding structures, changes in sensation, implant malposition, implant extrusion, implant failure, capsular contracture, ALCL, risks of anesthesia, need for further surgery   Alternatives: No surgery   Expected Length of Stay: 0 day(s)     I, or a designated member of my surgical team, have discussed the planned procedure, including the potential for any transfusion of blood products or receipt of tissue as necessary, expected benefits, the potential complications and risks and possible alternatives and their benefits and risks with the patient or the patient's surrogate. In my opinion, the patent or the patient's surrogate understands the proposed procedure, its risks, benefits, and alternatives.    Electronically signed by Bernadette Hoit, PA at 11:38 AM     Patient Consent:  I hereby give my consent and authorize [name], and Resident/s  (The list of possible assistants, all of whom are privileged to provide surgical services at the hospital, is available)  To treat the following: S/p implant based breast reconstruction, now with asymmetry  Procedure includes: Revision breast reconstruction with implant exchange  Laterality: Bilateral  1 The care provider has explained my condition to me, the benefits of having the above treatment procedure, and alternate ways of treating my condition. I understand that no guarantees have been made to me about the result of the treatment. The alternatives to this procedure  include: No surgery   2 The care provider has discussed with me the reasonably foreseeable risks of the treatment and that there may be undesirable results. The risks that are specifically related to this procedure include: Infection, pain, bleeding, scar, seroma, hematoma, asymmetry, delayed wound healing, injury to surrounding structures, changes in sensation, implant malposition, implant extrusion, implant failure, capsular contracture, ALCL, risks of anesthesia, need for further surgery   3 I understand that during the treatment a condition may be discovered which was not known before the treatment started. Therefore, I authorize the care provider to perform any additional or different treatment which is thought necessary and available.   4 Any tissue, parts, or substances removed during the procedure may be retained or disposed of in accordance with customary scientific, educational and clinical practice.   5 Vendor information if appropriate: If a vendor representative is expected to be present during my procedure, it has been explained to me that the vendor representative works for (manufacturer of the device to be used) and that his/her role includes . I consent to the vendor representative's presence and involvement as described. If circumstances change and a decision is made during my procedure that a vendor representative's presence is needed, I will be notified of the above after my procedure is completed.  Equipment: none   6 If blood products are needed, I would agree: Yes   7 If tissue products are needed, I would agree: [name]/A    8 Blood/Tissue use limitations and/or exclusions: none      I have carefully read and fully understand this informed consent form, and have had sufficient opportunity to discuss my condition and the above procedure(s) with the care  provider and his/her associates, and all of my questions have been answered to my satisfaction. I understand that my surgeon/provider performing the  procedure may not be physically present in the operating/procedure room the entire time that I am there. My surgeon/provider has answered my questions regarding this and how it may relate to my surgery/procedure. I agree to the Plan of Care as outlined above.     With respect to receiving blood and/or blood products, I AGREE TO BLOOD TRANSFUSION except where limitations and/or exclusions have been noted above. I have had a chance to discuss the risks, benefits and alternatives regarding transfusion and/or receipt of tissues (as above) with my healthcare provider. My decision(s) regarding the transfusion of blood or blood components and/or the receipt of tissue are as above. I understand this covers my perioperative/periprocedural (before, during, and after the surgery/procedure) course of treatment and my entire hospitalization.  I understand that if my condition changes significantly during my postoperative course that my physicians will discuss the risks and benefits of transfusion with me again at that time. I may be asked to provide a secondary consent to receive or refuse blood transfusion.                    Patient Signature   (or Parent/Legal Guardian if pt is unable to sign or is a minor)  Date/Time     Electronic Signatures will display at the bottom of the consent form.    Your doctor or someone your doctor has appointed has told you that you may need blood or a blood product transfusion, which has been collected from volunteers, as part of your treatment as a patient.    The reasons you might need blood or blood products include, but are not limited to:    Significant loss of your own blood   Your body may not be getting enough oxygen to its tissues    Treatment of bleeding disorders caused by low platelet counts or platelets that do not work right (platelets are part of a cell that helps to form clots and keeps you from bleeding too much).   You may not have enough of other substances that help your  blood clot or stop you from bleeding more  The risks of getting a transfusion of blood or blood products include, but are not limited to:    Damage to the lungs   Difficulty breathing due to fluid in the lungs   The product may contain bacteria or rarely a virus (which includes HIV and Hepatitis)   Blood from the community blood supply has been collected from volunteer donors who have been screened for health risk. The blood has been tested for major blood transmitted disease, but no transfusion is 100% safe. The blood is tested with very sensitive and accurate tests to screen for hepatitis, AIDS, and other disease, which makes the risks very small.   You may have side effects from the transfusion (rash, fever, chills) or an allergic reaction   The transfusion increases your risks of getting infection or cancer coming back   The transfusion can increase the time you have to stay in the hospital   The transfusion can potentially cause death if the wrong blood is given or your body rejects the blood   Before blood is transfused, it is tested again to make sure it is the correct type  There are other options than getting blood or blood products from other people and they  include:    Drugs which can decrease bleeding   Drugs which can cause your body to make more blood (used in elective procedures with advance notice)   Autologous (your own blood) donation (needs pre-arrangement)   No transfusion  If you exercise your right to refuse to be transfused with blood or blood products; these things listed below, among others, could happen to you:   Your body may not get enough oxygen and suffer damage   You may have a higher chance of bleeding   You may limit other options for your condition   You may die from losing too much blood

## 2017-07-24 NOTE — Anesthesia Preprocedure Evaluation (Addendum)
Anesthesia Pre-operative History and Physical for [name]  .  .  .  Anesthesia Evaluation Information Source: records, patient     ANESTHESIA HISTORY  Pertinent(-):  No History of anesthetic complications or Family hx of anesthetic complications    GENERAL    + Obesity  Pertinent (-):  No Substance abuse: LMA #18 Mar 2017.    HEENT    + Visual Impairment          corrective lens for ADL PULMONARY  Pertinent(-):  No smoking or asthma    CARDIOVASCULAR    + Valvular heart disease          MVP  Pertinent(-):  No hypertension, past MI or angina    GI/HEPATIC/RENAL  Last PO Intake: >8hr before procedure and >2hr before procedure (clears)  Pertinent(-):  No GERD, PUD, liver  issues or renal issues NEURO/PSYCH    + Psychiatric Issues          anxiety, depression  Pertinent(-):  No seizures or cerebrovascular event    ENDO/OTHER    + Thyroid Disease (takes synthroid)          HYPOthyroid well controlled  Pertinent(-):  No diabetes mellitus     Comment: L breast ca    HEMATOLOGIC    + Arthritis  Pertinent(-):  No coagulopathy       Physical Exam    Airway            Mouth opening: limited            Mallampati: III            TM distance (fb): >3 FB            Neck ROM: full            Airway Impression: challenging  Dental   Normal Exam   Comment: Some crowns   Cardiovascular  Normal Exam           Rhythm: regular           Rate: normal         Pulmonary   Normal Exam    breath sounds clear to auscultation    Mental Status     oriented to person, place and time       ________________________________________________________________________  PLAN  ASA Score  2  Anesthetic Plan general     Induction (routine IV) General Anesthesia/Sedation Maintenance Plan (inhaled agents and neuromuscular blockade);  Airway Manipulation (video laryngoscope); Airway (cuffed ETT); Line ( use current access); Monitoring (standard ASA); Positioning (supine); PONV Plan (dexamethasone and ondansetron); Pain (per surgical team); PostOp  (PACU)    Informed Consent     Risks:          Risks discussed were commensurate with the plan listed above with the following specific points: N/V and sore throat, unexpected serious injury    Anesthetic Consent:         Anesthetic plan (and risks as noted above) were discussed with patient and spouse    Attending Attestation:  As the primary attending anesthesiologist, I attest that the patient or proxy understands and accepts the risks and benefits of the anesthesia plan. I also attest that I have personally performed a pre-anesthetic examination and evaluation, and prescribed the anesthetic plan for this particular location within 48 hours prior to the anesthetic as documented. Janne Napoleon, MD 8:25 AM

## 2017-07-25 ENCOUNTER — Encounter
Admission: RE | Disposition: A | Discharge status: Home / Self Care | Payer: Self-pay | Source: Ambulatory Visit | Attending: Surgery

## 2017-07-25 ENCOUNTER — Ambulatory Visit: Payer: Medicare (Managed Care) | Admitting: Anesthesiology

## 2017-07-25 ENCOUNTER — Ambulatory Visit
Admission: RE | Admit: 2017-07-25 | Discharge: 2017-07-25 | Disposition: A | Discharge status: Home / Self Care | Payer: Medicare (Managed Care) | Source: Ambulatory Visit | Attending: Surgery | Admitting: Surgery

## 2017-07-25 ENCOUNTER — Ambulatory Visit: Payer: Medicare (Managed Care)

## 2017-07-25 DIAGNOSIS — Z853 Personal history of malignant neoplasm of breast: Secondary | ICD-10-CM

## 2017-07-25 DIAGNOSIS — Z9889 Other specified postprocedural states: Secondary | ICD-10-CM

## 2017-07-25 DIAGNOSIS — D0512 Intraductal carcinoma in situ of left breast: Secondary | ICD-10-CM

## 2017-07-25 DIAGNOSIS — L905 Scar conditions and fibrosis of skin: Secondary | ICD-10-CM

## 2017-07-25 DIAGNOSIS — Z421 Encounter for breast reconstruction following mastectomy: Secondary | ICD-10-CM | POA: Insufficient documentation

## 2017-07-25 HISTORY — PX: PR INSERTION BREAST IMPLANT SAME DAY OF MASTECTOMY: 19340

## 2017-07-25 LAB — MCHC: MCHC: 33 g/dL (ref 32–36)

## 2017-07-25 LAB — POCT GLUCOSE: Glucose POCT: 111 mg/dL — ABNORMAL HIGH (ref 60–99)

## 2017-07-25 LAB — TYPE AND SCREEN
ABO RH Blood Type: A POS
Antibody Screen: NEGATIVE

## 2017-07-25 LAB — HEMATOCRIT: Hematocrit: 40 % (ref 34–45)

## 2017-07-25 SURGERY — REVISION, RECONSTRUCTION, BREAST
Anesthesia: General | Site: Breast | Laterality: Bilateral | Wound class: Clean

## 2017-07-25 MED ORDER — DEXAMETHASONE SODIUM PHOSPHATE 4 MG/ML INJ SOLN *WRAPPED*
INTRAMUSCULAR | Status: AC
Start: 2017-07-25 — End: 2017-07-25
  Filled 2017-07-25: qty 2

## 2017-07-25 MED ORDER — OXYCODONE HCL 10 MG PO TABS *I*
10.0000 mg | ORAL_TABLET | ORAL | Status: DC | PRN
Start: 2017-07-25 — End: 2017-07-25

## 2017-07-25 MED ORDER — LACTATED RINGERS IV SOLN *I*
20.0000 mL/h | INTRAVENOUS | Status: DC
Start: 2017-07-25 — End: 2017-07-25
  Administered 2017-07-25: 20 mL/h via INTRAVENOUS

## 2017-07-25 MED ORDER — NEOSTIGMINE METHYLSULFATE 1 MG/ML IJ SOLN WRAPPED *I*
Status: DC | PRN
Start: 2017-07-25 — End: 2017-07-25
  Administered 2017-07-25: 2 mg via INTRAVENOUS

## 2017-07-25 MED ORDER — VANCOMYCIN HCL 1000 MG IV SOLR WRAPPED *I*
INTRAVENOUS | Status: AC
Start: 2017-07-25 — End: 2017-07-25
  Filled 2017-07-25: qty 20

## 2017-07-25 MED ORDER — ACETAMINOPHEN 500 MG PO TABS *I*
1000.0000 mg | ORAL_TABLET | Freq: Three times a day (TID) | ORAL | Status: DC
Start: 2017-07-25 — End: 2019-05-04

## 2017-07-25 MED ORDER — ATRACURIUM BESYLATE 50 MG/5ML IV SOLN *WRAPPED*
INTRAVENOUS | Status: AC
Start: 2017-07-25 — End: 2017-07-25
  Filled 2017-07-25: qty 5

## 2017-07-25 MED ORDER — VANCOMYCIN HCL 1000 MG IV SOLR WRAPPED *I*
INTRAVENOUS | Status: DC | PRN
Start: 2017-07-25 — End: 2017-07-25
  Administered 2017-07-25: 1 g

## 2017-07-25 MED ORDER — EPINEPHRINE 1 MG/ML IJ SOLUTION WRAPPED *I*
INTRAMUSCULAR | Status: AC
Start: 2017-07-25 — End: 2017-07-25
  Filled 2017-07-25: qty 1

## 2017-07-25 MED ORDER — SODIUM CHLORIDE 0.9 % 100 ML IV SOLN *I*
6.2500 mg | Freq: Once | INTRAVENOUS | Status: DC | PRN
Start: 2017-07-25 — End: 2017-07-25

## 2017-07-25 MED ORDER — SODIUM CHLORIDE 0.9 % FLUSH FOR PUMPS *I*
0.0000 mL/h | INTRAVENOUS | Status: DC | PRN
Start: 2017-07-25 — End: 2017-07-25

## 2017-07-25 MED ORDER — HYDROMORPHONE HCL PF 0.5 MG/0.5 ML IJ SOLN *I*
0.5000 mg | INTRAMUSCULAR | Status: DC | PRN
Start: 2017-07-25 — End: 2017-07-25
  Administered 2017-07-25 (×2): 0.5 mg via INTRAVENOUS
  Filled 2017-07-25 (×2): qty 0.5

## 2017-07-25 MED ORDER — ONDANSETRON HCL 2 MG/ML IV SOLN *I*
INTRAMUSCULAR | Status: DC | PRN
Start: 2017-07-25 — End: 2017-07-25
  Administered 2017-07-25: 4 mg via INTRAMUSCULAR

## 2017-07-25 MED ORDER — BACITRACIN 50000 UNIT IM SOLR *I*
INTRAMUSCULAR | Status: AC
Start: 2017-07-25 — End: 2017-07-25
  Filled 2017-07-25: qty 10

## 2017-07-25 MED ORDER — ONDANSETRON HCL 2 MG/ML IV SOLN *I*
INTRAMUSCULAR | Status: AC
Start: 2017-07-25 — End: 2017-07-25
  Filled 2017-07-25: qty 2

## 2017-07-25 MED ORDER — CEPHALEXIN 500 MG PO CAPS *I*
500.0000 mg | ORAL_CAPSULE | Freq: Four times a day (QID) | ORAL | 0 refills | Status: AC
Start: 2017-07-25 — End: 2017-07-30

## 2017-07-25 MED ORDER — METOCLOPRAMIDE HCL 5 MG/ML IJ SOLN *I*
INTRAMUSCULAR | Status: DC | PRN
Start: 2017-07-25 — End: 2017-07-25
  Administered 2017-07-25: 10 mg via INTRAVENOUS

## 2017-07-25 MED ORDER — ATRACURIUM BESYLATE 50 MG/5ML IV SOLN *WRAPPED*
INTRAVENOUS | Status: DC | PRN
Start: 2017-07-25 — End: 2017-07-25
  Administered 2017-07-25 (×2): 10 mg via INTRAVENOUS
  Administered 2017-07-25: 30 mg via INTRAVENOUS

## 2017-07-25 MED ORDER — DOCUSATE SODIUM 100 MG PO CAPS *I*
100.0000 mg | ORAL_CAPSULE | Freq: Two times a day (BID) | ORAL | 0 refills | Status: AC | PRN
Start: 2017-07-25 — End: 2017-08-24

## 2017-07-25 MED ORDER — GLYCOPYRROLATE 0.2 MG/ML IJ SOLN *WRAPPED*
INTRAMUSCULAR | Status: AC
Start: 2017-07-25 — End: 2017-07-25
  Filled 2017-07-25: qty 2

## 2017-07-25 MED ORDER — ONDANSETRON HCL 2 MG/ML IV SOLN *I*
4.0000 mg | Freq: Four times a day (QID) | INTRAMUSCULAR | Status: DC | PRN
Start: 2017-07-25 — End: 2017-07-25

## 2017-07-25 MED ORDER — METOCLOPRAMIDE HCL 5 MG/ML IJ SOLN *I*
INTRAMUSCULAR | Status: AC
Start: 2017-07-25 — End: 2017-07-25
  Filled 2017-07-25: qty 2

## 2017-07-25 MED ORDER — IBUPROFEN 400 MG PO TABS *I*
800.0000 mg | ORAL_TABLET | Freq: Three times a day (TID) | ORAL | Status: DC | PRN
Start: 2017-07-25 — End: 2021-05-24

## 2017-07-25 MED ORDER — SCOPOLAMINE BASE 1.5 MG TD PT72 *I*
1.0000 | MEDICATED_PATCH | TRANSDERMAL | Status: DC
Start: 2017-07-25 — End: 2017-07-25
  Administered 2017-07-25: 1 via TRANSDERMAL
  Filled 2017-07-25: qty 1

## 2017-07-25 MED ORDER — ONDANSETRON HCL 2 MG/ML IV SOLN *I*
4.0000 mg | Freq: Once | INTRAMUSCULAR | Status: DC | PRN
Start: 2017-07-25 — End: 2017-07-25

## 2017-07-25 MED ORDER — PROPOFOL 10 MG/ML IV EMUL (INTERMITTENT DOSING) WRAPPED *I*
INTRAVENOUS | Status: AC
Start: 2017-07-25 — End: 2017-07-25
  Filled 2017-07-25: qty 20

## 2017-07-25 MED ORDER — OXYCODONE HCL 5 MG PO CAPS *A*
5.0000 mg | ORAL_CAPSULE | Freq: Four times a day (QID) | ORAL | 0 refills | Status: DC | PRN
Start: 2017-07-25 — End: 2018-04-25

## 2017-07-25 MED ORDER — LACTATED RINGERS IV SOLN *I*
125.0000 mL/h | INTRAVENOUS | Status: DC
Start: 2017-07-25 — End: 2017-07-25
  Administered 2017-07-25: 125 mL/h via INTRAVENOUS

## 2017-07-25 MED ORDER — NEOSTIGMINE METHYLSULFATE 1 MG/ML IJ SOLN WRAPPED *I*
Status: AC
Start: 2017-07-25 — End: 2017-07-25
  Filled 2017-07-25: qty 3

## 2017-07-25 MED ORDER — OXYCODONE HCL 5 MG PO TABS *I*
5.0000 mg | ORAL_TABLET | ORAL | Status: DC | PRN
Start: 2017-07-25 — End: 2017-07-25
  Administered 2017-07-25: 5 mg via ORAL
  Filled 2017-07-25: qty 1

## 2017-07-25 MED ORDER — FENTANYL CITRATE 50 MCG/ML IJ SOLN *WRAPPED*
INTRAMUSCULAR | Status: DC | PRN
Start: 2017-07-25 — End: 2017-07-25
  Administered 2017-07-25: 100 ug via INTRAVENOUS

## 2017-07-25 MED ORDER — EPHEDRINE 5MG/ML IN NS IV/IJ *WRAPPED*
INTRAMUSCULAR | Status: AC
Start: 2017-07-25 — End: 2017-07-25
  Filled 2017-07-25: qty 5

## 2017-07-25 MED ORDER — GLYCOPYRROLATE 0.2 MG/ML IJ SOLN *WRAPPED*
INTRAMUSCULAR | Status: DC | PRN
Start: 2017-07-25 — End: 2017-07-25
  Administered 2017-07-25: 0.2 mg via INTRAVENOUS
  Administered 2017-07-25: 0.4 mg via INTRAVENOUS

## 2017-07-25 MED ORDER — SODIUM CHLORIDE 0.9 % IV SOLN WRAPPED *I*
20.0000 mL/h | Status: DC
Start: 2017-07-25 — End: 2017-07-25

## 2017-07-25 MED ORDER — HALOPERIDOL LACTATE 5 MG/ML IJ SOLN *I*
0.5000 mg | Freq: Once | INTRAMUSCULAR | Status: DC | PRN
Start: 2017-07-25 — End: 2017-07-25

## 2017-07-25 MED ORDER — DEXAMETHASONE SODIUM PHOSPHATE 4 MG/ML INJ SOLN *WRAPPED*
INTRAMUSCULAR | Status: DC | PRN
Start: 2017-07-25 — End: 2017-07-25
  Administered 2017-07-25: 8 mg via INTRAVENOUS

## 2017-07-25 MED ORDER — BACITRACIN-POLYMYXIN B 500-10000 UNIT/GM EX OINT *I*
TOPICAL_OINTMENT | CUTANEOUS | Status: AC
Start: 2017-07-25 — End: 2017-07-25
  Filled 2017-07-25: qty 28.35

## 2017-07-25 MED ORDER — PROPOFOL 10 MG/ML IV EMUL (INTERMITTENT DOSING) WRAPPED *I*
INTRAVENOUS | Status: DC | PRN
Start: 2017-07-25 — End: 2017-07-25
  Administered 2017-07-25: 200 mg via INTRAVENOUS

## 2017-07-25 MED ORDER — BUPIVACAINE-EPINEPHRINE 0.25 % IJ SOLUTION *WRAPPED*
INTRAMUSCULAR | Status: AC
Start: 2017-07-25 — End: 2017-07-25
  Filled 2017-07-25: qty 30

## 2017-07-25 MED ORDER — SODIUM CHLORIDE 0.9 % IV SOLN WRAPPED *I*
INTRAMUSCULAR | Status: DC | PRN
Start: 2017-07-25 — End: 2017-07-25
  Administered 2017-07-25: 9 mL via INTRAMUSCULAR

## 2017-07-25 MED ORDER — LIDOCAINE HCL (PF) 1 % IJ SOLN *I*
0.1000 mL | INTRAMUSCULAR | Status: DC | PRN
Start: 2017-07-25 — End: 2017-07-25
  Administered 2017-07-25: 0.1 mL via SUBCUTANEOUS
  Filled 2017-07-25: qty 2

## 2017-07-25 MED ORDER — EPHEDRINE 5MG/ML IN NS IV/IJ *WRAPPED*
INTRAMUSCULAR | Status: DC | PRN
Start: 2017-07-25 — End: 2017-07-25
  Administered 2017-07-25: 5 mg via INTRAVENOUS
  Administered 2017-07-25: 10 mg via INTRAVENOUS

## 2017-07-25 MED ORDER — SODIUM CHLORIDE 0.9 % IV SOLN WRAPPED *I*
INTRAMUSCULAR | Status: DC | PRN
Start: 2017-07-25 — End: 2017-07-25

## 2017-07-25 MED ORDER — LIDOCAINE HCL (PF) 1 % IJ SOLN *I*
INTRAMUSCULAR | Status: AC
Start: 2017-07-25 — End: 2017-07-25
  Filled 2017-07-25: qty 30

## 2017-07-25 MED ORDER — FENTANYL CITRATE 50 MCG/ML IJ SOLN *WRAPPED*
INTRAMUSCULAR | Status: AC
Start: 2017-07-25 — End: 2017-07-25
  Filled 2017-07-25: qty 2

## 2017-07-25 MED ORDER — MIDAZOLAM HCL 1 MG/ML IJ SOLN *I* WRAPPED
INTRAMUSCULAR | Status: AC
Start: 2017-07-25 — End: 2017-07-25
  Filled 2017-07-25: qty 2

## 2017-07-25 MED ORDER — LIDOCAINE HCL 2 % IJ SOLN *I*
INTRAMUSCULAR | Status: DC | PRN
Start: 2017-07-25 — End: 2017-07-25
  Administered 2017-07-25: 80 mg via INTRAVENOUS

## 2017-07-25 MED ORDER — DEXTROSE 5 % FLUSH FOR PUMPS *I*
0.0000 mL/h | INTRAVENOUS | Status: DC | PRN
Start: 2017-07-25 — End: 2017-07-25

## 2017-07-25 MED ORDER — MEPERIDINE HCL 25 MG/ML IJ SOLN *I*
12.5000 mg | INTRAMUSCULAR | Status: DC | PRN
Start: 2017-07-25 — End: 2017-07-25

## 2017-07-25 MED ORDER — MIDAZOLAM HCL 1 MG/ML IJ SOLN *I* WRAPPED
INTRAMUSCULAR | Status: DC | PRN
Start: 2017-07-25 — End: 2017-07-25
  Administered 2017-07-25: 2 mg via INTRAVENOUS

## 2017-07-25 MED ORDER — CEFAZOLIN 2000 MG IN STERILE WATER 20ML SYRINGE *I*
2000.0000 mg | PREFILLED_SYRINGE | Freq: Once | INTRAVENOUS | Status: AC
Start: 2017-07-25 — End: 2017-07-25
  Administered 2017-07-25: 2000 mg via INTRAVENOUS
  Filled 2017-07-25: qty 20

## 2017-07-25 MED ORDER — SUCCINYLCHOLINE CHLORIDE 20 MG/ML IV/IJ SOLN *WRAPPED*
Status: DC | PRN
Start: 2017-07-25 — End: 2017-07-25
  Administered 2017-07-25: 100 mg via INTRAVENOUS

## 2017-07-25 MED ORDER — LIDOCAINE HCL 2 % (PF) IJ SOLN *I*
INTRAMUSCULAR | Status: AC
Start: 2017-07-25 — End: 2017-07-25
  Filled 2017-07-25: qty 5

## 2017-07-25 SURGICAL SUPPLY — 43 items
ADHESIVE DERMABOND GLUE HI VISCOSITY (Dressing)
ADHESIVE SKIN CLOSURE 0.7ML DERMABOND ADVANCED (Dressing) ×4 IMPLANT
APPLICATOR CHLORAPREP 26ML ORANGE LARGE (Solution) ×3 IMPLANT
BLADE ELECTRODE COATED 2.5IN (Supply) ×5 IMPLANT
BLADE SURG CARBON STEEL #15 STER (Supply) ×9 IMPLANT
BLADE SURG CARBON STEEL #15 STER REUSE HNDL (Supply) ×3 IMPLANT
BRA JUDY LONG LINE SURGICAL XL (Other) ×2
BRA SURG XL WHT NYL LYCRA FAB (Other) ×1 IMPLANT
BULB RESERVOIR SUCT 100CC SILICONE (Supply) IMPLANT
CORD BIPOLAR 12 FT (Supply) ×2
CORD ELECTROSURGICAL L12FT BIPOLAR DISPOSABLE FOR FOOT SWITCHING FORCEPS (Supply) ×1 IMPLANT
DRAIN WOUND ROUND 15FR W/TROCAR (Supply) ×4 IMPLANT
DRESSING TEGADERM 4 X 10IN (Dressing) ×3 IMPLANT
DRESSING TEGADERM 6 X 8IN (Dressing) ×3 IMPLANT
DRESSING TEGADERM W/LABEL 2 3/8 X 2 3/4 (Dressing) ×6 IMPLANT
DRESSING TELFA 8X3 RELEASE (Dressing) ×3 IMPLANT
ELECTRODE BLADE MODIFIED 2.5IN (Supply) ×4 IMPLANT
FILTER NEPTUNE 4PORT MANIFOLD (Supply) ×3 IMPLANT
GLOVE LINER BIOGEL INDICATOR SZ7.5 LTX (Glove) ×3 IMPLANT
GLOVE SURG BIOGEL SZ7.5 LTX STER PF (Glove) ×7 IMPLANT
MARKER SUR ST STD SKIN DISP WRITESITE + (Supply) ×1 IMPLANT
MARKER SURGICAL PEN (Supply) ×2
NEEDLE HYPO BVL LF 25G X 1.5IN (Needle) ×3 IMPLANT
NEEDLE QUINCKE SPI 20G X 3.5IN (Needle) ×3 IMPLANT
Natrelle Inspira Cohesive Breast Implant Smooth Ro (Implant) ×3 IMPLANT
PACK CUSTOM GENERAL PLASTIC (Pack) ×3 IMPLANT
PEN SURG MARKER REG TIP (Other) ×12 IMPLANT
PENCIL ROCKER SWITCH W/HOLSTER SS DISP (Supply) ×2 IMPLANT
POSITIONER HEAD RING 9IN (Supply) ×3 IMPLANT
SIZER INSPIRA M 600 CC (Supply) ×2 IMPLANT
SUTR CHROMIC GUT 4-0 FS-2 27IN (Suture) ×6 IMPLANT
SUTR MONCRYL 3-0 PS-2 UND 27IN (Suture) ×12 IMPLANT
SUTR MONOCRYL PLUS 4-0 18PS2 (Suture) ×10 IMPLANT
SUTR PDS II MONO 2-0 CT1 27IN CLEAR (Suture) ×12 IMPLANT
SUTR SILK 2-0 FS 18 IN BLACK (Suture) ×7 IMPLANT
SUTR VICRYL ANTIB 2-0 CT1 8-18 UNDY (Suture) ×3 IMPLANT
SUTR VICRYL ANTIB 3-0 SH 18 UNDY (Suture) ×3 IMPLANT
SYRINGE IRRIG BULB 50CC (Syringe) ×2 IMPLANT
SYRINGE LUERLOCK 20CC (Syringe)
SYRINGE LUERLOCK 20ML INDIVIDUAL WRAP (Syringe) ×1
SYRINGE LUERLOCK 20ML INDIVIDUAL WRAPPED (Syringe) ×1 IMPLANT
TIP SUCT YANK BULBOUS W/O VENT (Supply) ×3 IMPLANT
TUBING NONCONDUC CONN 12FT X 3/16IN (Tubing) ×3 IMPLANT

## 2017-07-25 NOTE — H&P (Signed)
UPDATES TO PATIENT'S CONDITION on the DAY OF SURGERY/PROCEDURE    I. Updates to Patient's Condition (to be completed by a provider privileged to complete a H&P, following reassessment of the patient by the provider):    Day of Surgery/Procedure Update:  History  History reviewed and no change    Physical  Physical exam updated and no change            II. Procedure Readiness   I have reviewed the patient's H&P and updated condition. By completing and signing this form, I attest that this patient is ready for surgery/procedure.    III. Attestation   I have reviewed the updated information regarding the patient's condition and it is appropriate to proceed with the planned surgery/procedure.    Hendricks Milo, MD as of 9:02 AM 07/25/2017

## 2017-07-25 NOTE — INTERIM OP NOTE (Signed)
Interim Op Note (Surgical Log ID: [id number])       Date of Surgery: 07/25/2017       Surgeons: Surgeon(s) and Role:     * Hendricks Milo, MD - Primary     * Rosalea Withrow, Guilford Shi, MD - Resident - Assisting       Pre-op Diagnosis: Pre-Op Diagnosis Codes:     * S/P breast reconstruction, bilateral [Z98.890]       Post-op Diagnosis: Post-Op Diagnosis Codes:     * S/P breast reconstruction, bilateral [Z98.890]       Procedure(s) Performed: Procedure(s) (LRB):  Bilateral Tissue Exapnder Removal with Bilateral Implant Insertion (Bilateral)       Anesthesia Type: General        Fluid Totals: I/O this shift:  06/13 0700 - 06/13 1459  In: 1350 (18 mL/kg) [I.V.:1350]  Out: 7 (0.1 mL/kg) [Blood:7]  Net: 1343  Weight: 74.8 kg        Estimated Blood Loss: 7 mL       Specimens to Pathology:  ID Type Source Tests Collected by Time Destination   A : Right Mastectomy Scar  TISSUE Tissue SURGICAL PATHOLOGY Laurin Coder, RN 07/25/2017 0931    B : Left Mastectomy Scar  TISSUE Tissue SURGICAL PATHOLOGY Laurin Coder, RN 07/25/2017 0935           Temporary Implants:        Packing:                 Patient Condition: good       Findings (Including unexpected complications): Bilateral breast reconstruction revision; exchange for permanent implants.      Signed:  Hendricks Limes, MD  on 07/25/2017 at 10:50 AM

## 2017-07-25 NOTE — Anesthesia Procedure Notes (Signed)
---------------------------------------------------------------------------------------------------------------------------------------    AIRWAY   GENERAL INFORMATION AND STAFF    Patient location during procedure: OR       Date of Procedure: 07/25/2017 9:22 AM  CONDITION PRIOR TO MANIPULATION     Current Airway/Neck Condition:  Normal        For more airway physical exam details, see Anesthesia PreOp Evaluation  AIRWAY METHOD     Patient Position:  Sniffing    Preoxygenated: yes      Induction: IV    Mask Difficulty Assessment:  2 - vent by mask + OPA/NPA      Mask NMB: 2 - vent by mask + OPA/NPA      Technique Used for Successful ETT Placement:  Video laryngoscopy    Devices/Methods Used in Placement:  Intubating stylet    Blade Type:  Macintosh    Laryngoscope Blade/Video laryngoscope Blade Size:  3    Cormack-Lehane Classification:  Grade I - full view of glottis    Placement Verified by: capnometry and equal breath sounds      Number of Attempts at Approach:  1    Number of Other Approaches Attempted:  0  FINAL AIRWAY DETAILS    Final Airway Type:  Endotracheal airway    Final Endotracheal Airway:  ETT      Cuffed: cuffed    Insertion Site:  Oral    ETT Size (mm):  7.0    Distance inserted from Teeth (cm):  21  ----------------------------------------------------------------------------------------------------------------------------------------

## 2017-07-25 NOTE — Progress Notes (Signed)
Patient discharged home. Verbalizes understanding of all discharge instructions, symptom management and follow up care.

## 2017-07-25 NOTE — Discharge Instructions (Signed)
DISCHARGE INSTRUCTIONS      DATE OF SURGERY: 07/25/2017    PROCEDURES: Revision of bilateral breast reconstruction with exchange for permanent implants.     SURGEON: Hendricks Milo, MD     DIET: Resume your usual healthy diet    ACTIVITY: No strenuous exercise. It is okay to gradually increase your walking as tolerated and climb stairs. Avoid jogging, aerobics or other strenuous activity for 2 weeks to allow the breast to heal. Do not do any heavy (anything heavier than a gallon of milk) lifting or vigorous upper body exercise. No pushing/pulling.     CARE OF DRESSING OR INCISION:  Do not peel off steristrips (skin tape) or dermabond (skin glue). Wear your post surgical garments (bra and/or compression garments) at all times. Use dry dressings as needed for comfort or drainage.    BATHING/SHOWERING: You may shower beginning 2 days after your surgery. Do not soak in a tub or pool for 2 weeks. Do not rub your sutures. Pat all of your incisions dry with a clean towel or paper towel.  No lotions or creams on your incisions.    SMOKING: Smoking can reduce the quality of your wound healing and increase your chances of wound infections, as well as increase your chances of developing chronic health problems or worsen conditions you already have. If you smoke, you should quit. Smoking cessation information is available for your review to help you quit. Medications to help you quit are available. Ask your primary care physician if you would like to receive these medications.    DIABETES: If you are diabetic, check your blood sugar at least daily. Poor blood sugar control can lead to delayed wound healing and infections. Follow up with your primary care physician or endocrinologist to discuss your diabetic care regimen.    ANTIBIOTICS: You may have been prescribed antibiotics in order to prevent infection. Please take the entire course of prescribed antibiotics.    PAIN MANAGEMENT: Narcotic use is associated with  significant adverse effects, including but not limited to nausea, constipation, itching, drowsiness, and addiction. Narcotic use should be reserved for severe pain that is not relieved by non-narcotic medications such as acetaminophen (Tylenol) and ibuprofen (Motrin, Advil), unless you are unable to take these medications for medical reasons. Do not operate heavy machinery, drive, or make important decisions while on narcotic pain medications. It is important to wean your narcotic use as soon as possible after surgery. Be careful not to exceed 3,000 mg of acetaminophen in a day from all sources (Tylenol with Codeine, Norco, Vicodin, and Percocet contain acetaminophen).    Keep a record of your narcotic use by recording it in this chart:  Name of medication:   Date Time/dose Time/dose Time/dose Time/dose Time/dose Time/dose                                                                             NOTIFY YOUR SURGEON AT [phone] FOR:   FEVER over 101 F/38.4 C   PAIN not relieved with pain medication ordered   SWELLING, especially if asymmetrical in a bilateral procedure   REDNESS at incision site, especially if it is spreading   BLEEDING that does not respond to  20 minutes of direct uninterrupted direct pressure   DRAINAGE that is not pink or clear   PROBLEMS WITH DRAINS that are not solved with troubleshooting instructions provided   DIFFICULTY URINATING   CONSTIPATION not relieved by over the counter medications (docusate, senna, bisacodyl, Miralax)  Urgent concerns can be addressed 24 hours a day, 7 days a week. Nonurgent concerns can be addressed during regular business hours M-F 8am-4pm.    Signed: Hendricks Limes, MD as of: 07/25/2017  at: 10:51 AM     Drain Care Instructions    Drain Care:    There is a stitch securing the drain. This will be removed when the drain is removed. If you develop drainage around the drain, keep a clean dry gauze dressing over the site. Call if you have a lot of  drainage.    If you have Biopatches on your drains: please change the Biopatches and Tegederm (clear adhesive film) every 3 days (72 hours). These can be purchased at medical supply stores or online.   Milk or strip your drains a few times a day or when it looks clogged. Use one hand to tightly secure the drain where it enters your skin. Use the other hand's forefinger and thumb to squeeze the tubing and pull towards the bulb. If the tubing is long you may have to do this a few times along the length of the tubing. You should see the contents of the tubing get squeezed into the bulb.   Empty the bulbs at least two (2) times a day. If it gets full more often, empty whenever it gets full. Make note of the amount of drainage every time you empty the drain. Wash your hands before and after emptying the drain. To empty the drain, open the plug and carefully pour the contents into a measuring cup. Squeeze the bulb and close the plug. The drain should look like it does while squeezing it (like it has a dent in it). Record the total daily drainage amount in the chart below. Bring this paper to your follow-up appointment. The drain will be removed once the amount of drainage is less than 30cc (about 1 ounce) every 24 hours for at least 2 consecutive days.   You may have been prescribed antibiotics in order to prevent infections. Please take the entire course of prescribed antibiotics.   Secure your drains prior to showering (e.g. thread a shoelace through the tabs on the JP bulbs and wear the shoelace as a necklace while showering).      Potential Problems (Troubleshooting):   The drain does not stay compressed: The drain should never look round and smooth (like a hand grenade) except when the plug is open while emptying the drain. If it does look round and smooth, it is either full, the plug is open, or there is a leak in the system. Try emptying and resealing the bulb. If it still becomes round and smooth, call  your doctors office (this is not an emergency; it can wait until working hours).    The drain suddenly stops draining or starts leaking around the drain site onto the bandage: The drainage should slowly taper off. If the drain suddenly stops collecting fluid or starts leaking at the drain site, it may be clogged. If there is an obvious blockage in the tubing, you may try to gently dislodge the blockage by squeezing and milking the tubing. Be careful not to pull on the drain as this will pull  at the stitch holding it in place and will hurt. If you are unable to dislodge the blockage or you do not see any obvious blockage, call your doctors office (This is not an emergency. It can wait until working hours.)      When to call:   Call if you have bleeding, severe pain, swelling, redness around the drain site, or fever.   Call if the drain is not working properly and you have not been able to fix the problem using the instructions above.   Your drain output should look clear yellow to dark pink. Please note it and call if the color is greenish, gray, or dark red.   Call if you have any questions.        Drain output worksheet:     Date:                 Drain 1 AM                  PM                  Total                 Drain 2 AM                  PM                  Total                 Drain 3 AM                  PM                  Total                 Drain 4 AM                  PM                  Total                 Drain 5 AM                  PM                  Total                 Drain 6 AM                  PM                  Total

## 2017-07-25 NOTE — Anesthesia Case Conclusion (Signed)
CASE CONCLUSION  Emergence  Actions:  Suctioned and extubated  Criteria Used for Airway Removal:  Adequate Tv & RR, acceptable O2 saturation and sustained tetany  Assessment:  Routine  Transport  Directly to: PACU  Airway:  Facemask  Oxygen Delivery:  10 lpm  Position:  Recumbent  Patient Condition on Handoff  Level of Consciousness:  Alert/talking/calm  Patient Condition:  Stable  Handoff Report to:  RN

## 2017-07-25 NOTE — Anesthesia Postprocedure Evaluation (Signed)
Anesthesia Post-Op Note    Patient: [name]    Procedure(s) Performed:  Procedure Summary  Date:  07/25/2017 Anesthesia Start: 07/25/2017  8:55 AM Anesthesia Stop: 07/25/2017 10:38 AM Room / Location:  [id number]_OR_[id number] / [facility] MAIN OR   Procedure(s):  Bilateral Tissue Exapnder Removal with Bilateral Implant Insertion Diagnosis:  S/P breast reconstruction, bilateral [Z98.890] Surgeon(s):  Hendricks Milo, MD  Wingate, Guilford Shi, MD Attending Anesthesiologist:  Janne Napoleon, MD         Recovery Vitals  BP: 126/85 (07/25/2017 10:45 AM)  Heart Rate: 82 (07/25/2017 10:45 AM)  Heart Rate (via Pulse Ox): 80 (07/25/2017 10:45 AM)  Resp: 19 (07/25/2017 10:45 AM)  Temp: 36.2 C (97.2 F) (07/25/2017 10:35 AM)  SpO2: 98 % (07/25/2017 10:45 AM)   0-10 Scale: 2 (07/25/2017 10:45 AM)  Anesthesia type:  General  Complications Noted During Procedure or in PACU:  None   Comment:    Patient Location:  PACU  Level of Consciousness:    Awake, oriented and alert  Patient Participation:     Able to participate  Temperature Status:    Normothermic  Oxygen Saturation:    Within patient's normal range  Cardiac Status:   Within patient's normal range  Fluid Status:    Stable  Airway Patency:     Yes  Pulmonary Status:    Baseline  Pain Management:    Adequate analgesia  Nausea and Vomiting:  None    Post Op Assessment:    Tolerated procedure well and no evidence of recall   Attending Attestation:  All indicated post anesthesia care provided       Complications Noted During Recovery Period:      None  Condition of patient:      Satisfactory

## 2017-07-25 NOTE — Op Note (Signed)
Operative Note (Surgical Case/Log ID: [id number])       Date of Surgery: 07/25/2017       Surgeons: Surgeon(s) and Role:     * Acelynn Dejonge, Celene Skeen, MD - Primary     * Wingate, Guilford Shi, MD - Resident - Assisting       Pre-op Diagnosis: Pre-Op Diagnosis Codes:     * S/P breast reconstruction, bilateral [Z98.890]       Post-op Diagnosis: Post-Op Diagnosis Codes:     * S/P breast reconstruction, bilateral [Z98.890]       Procedure(s) Performed: Procedure(s) (LRB):  Bilateral Tissue Exapnder Removal with Bilateral Implant Insertion (Bilateral)       Anesthesia Type: General        Fluid Totals: I/O this shift:  06/13 0700 - 06/13 1459  In: 1991.7 (26.6 mL/kg) [P.O.:300; I.V.:1691.7]  Out: 37 (0.5 mL/kg) [Drains:30; Blood:7]  Net: 1954.7  Weight: 74.8 kg        Estimated Blood Loss: 7 mL       Specimens to Pathology:  ID Type Source Tests Collected by Time Destination   A : Right Mastectomy Scar  TISSUE Tissue SURGICAL PATHOLOGY Laurin Coder, RN 07/25/2017 1093    B : Left Mastectomy Scar  TISSUE Tissue SURGICAL PATHOLOGY Laurin Coder, RN 07/25/2017 0935           Temporary Implants:        Packing:                 Patient Condition: good       Indications: Second stage breast reconstruction.       Findings (Including unexpected complications): same     Description of Procedure: The patient was taken to the operating room and placed supine on the operating table. Following induction of general anesthetic, she was sterilely prepped and draped in a standard fashion. An operative pause was performed and all were in agreement. Both mastectomy scars were excised and sent off as separate specimens. Flaps were then raised and then a step cut was made in the junction between the cortiva adm and the pectoralis major muscle. The implants were removed and they had about 600 cc in them.  Since the pockets were a little wide, we performed bilateral capsulorrhapies. With 2-0 PDS running technique. Sizers were placed and we found  that 600cc was the right size for her. Accordingly, we took out of the box the more permanent implants and on the patient's right side, the serial number was [id number] and on the patient's left side it was [id number].These were allergan SCM round gel  breast implants. They were placed in the pockets sterilely. We used one drain per side.  We then closed the capsule with running 2-0 Vicryl, and then trimmed some additional skin and fat and closed with 3-0 and 4-0 Monocryl and Dermabond. This improved the appearance and the symmetry of the breast, as well as the texture and feel.       Signed:  Hendricks Milo, MD  on 07/25/2017 at 1:57 PM

## 2017-07-26 ENCOUNTER — Telehealth: Payer: Self-pay | Admitting: Plastic Surgery

## 2017-07-26 NOTE — Telephone Encounter (Signed)
Telephone Call Note    Patient called with concerned for high drain output from one of her breasts. She states that the output was very red and approximately 50cc since she left the hospital (~9 hours). This volume does not sound overly concerning in the absence of other signs of bleeding. She was instructed to monitor the output overnight and call in the office if it still appeared very red and if the output was high in the morning.    Myriam Jacobson, MD on 07/26/2017 at 11:33 AM  Division of Plastic and Reconstructive Surgery

## 2017-07-30 ENCOUNTER — Encounter: Payer: Self-pay | Admitting: Surgery

## 2017-07-31 ENCOUNTER — Ambulatory Visit: Payer: Medicare (Managed Care) | Attending: Surgery | Admitting: Surgery

## 2017-07-31 DIAGNOSIS — Z09 Encounter for follow-up examination after completed treatment for conditions other than malignant neoplasm: Secondary | ICD-10-CM

## 2017-07-31 NOTE — Progress Notes (Signed)
Post op visit s/p bilateral implant exchange.  Doing well drains scant,   Incisions are clean and dry, no sign of infection or fluid collection.  Drains removed todaya, return to clinic in about three months to discuss nipple reconstruction.

## 2017-08-05 LAB — SURGICAL PATHOLOGY

## 2017-08-08 ENCOUNTER — Encounter: Payer: Self-pay | Admitting: Surgery

## 2017-08-13 ENCOUNTER — Other Ambulatory Visit: Payer: Self-pay | Admitting: Gastroenterology

## 2017-08-14 ENCOUNTER — Other Ambulatory Visit
Admission: RE | Admit: 2017-08-14 | Discharge: 2017-08-14 | Disposition: A | Discharge status: Home / Self Care | Payer: Medicare (Managed Care) | Source: Ambulatory Visit | Attending: Internal Medicine | Admitting: Internal Medicine

## 2017-08-14 DIAGNOSIS — Z0000 Encounter for general adult medical examination without abnormal findings: Secondary | ICD-10-CM | POA: Insufficient documentation

## 2017-08-14 DIAGNOSIS — E039 Hypothyroidism, unspecified: Secondary | ICD-10-CM | POA: Insufficient documentation

## 2017-08-14 LAB — COMPREHENSIVE METABOLIC PANEL
ALT: 19 U/L (ref 0–35)
AST: 22 U/L (ref 0–35)
Albumin: 4.3 g/dL (ref 3.5–5.2)
Alk Phos: 79 U/L (ref 35–105)
Anion Gap: 11 (ref 7–16)
Bilirubin,Total: 0.5 mg/dL (ref 0.0–1.2)
CO2: 26 mmol/L (ref 20–28)
Calcium: 9.3 mg/dL (ref 8.6–10.2)
Chloride: 106 mmol/L (ref 96–108)
Creatinine: 0.82 mg/dL (ref 0.51–0.95)
GFR,Black: 86 *
GFR,Caucasian: 74 *
Glucose: 108 mg/dL — ABNORMAL HIGH (ref 60–99)
Lab: 19 mg/dL (ref 6–20)
Potassium: 4.7 mmol/L (ref 3.3–5.1)
Sodium: 143 mmol/L (ref 133–145)
Total Protein: 6.5 g/dL (ref 6.3–7.7)

## 2017-08-14 LAB — CBC
Hematocrit: 42 % (ref 34–45)
Hemoglobin: 13.5 g/dL (ref 11.2–15.7)
MCH: 31 pg/cell (ref 26–32)
MCHC: 32 g/dL (ref 32–36)
MCV: 97 fL — ABNORMAL HIGH (ref 79–95)
Platelets: 319 10*3/uL (ref 160–370)
RBC: 4.4 MIL/uL (ref 3.9–5.2)
RDW: 13.5 % (ref 11.7–14.4)
WBC: 4.7 10*3/uL (ref 4.0–10.0)

## 2017-08-14 LAB — LIPID PANEL
Chol/HDL Ratio: 2.6
Cholesterol: 224 mg/dL — AB
HDL: 87 mg/dL
LDL Calculated: 125 mg/dL
Non HDL Cholesterol: 137 mg/dL
Triglycerides: 62 mg/dL

## 2017-08-14 LAB — TSH: TSH: 1.18 u[IU]/mL (ref 0.27–4.20)

## 2017-10-16 IMAGING — CR XRAY SHOULDER COMPLETE RT
1 series · 2 of 2 positions shown · non-contrast
Comparison: None available.

EXAM:  XRAY SHOULDER COMPLETE RT
INDICATION: Right shoulder pain.

[Series 2: view not recorded · 0.17mm/px · 2 of 2 slices shown]
[im 1/2]
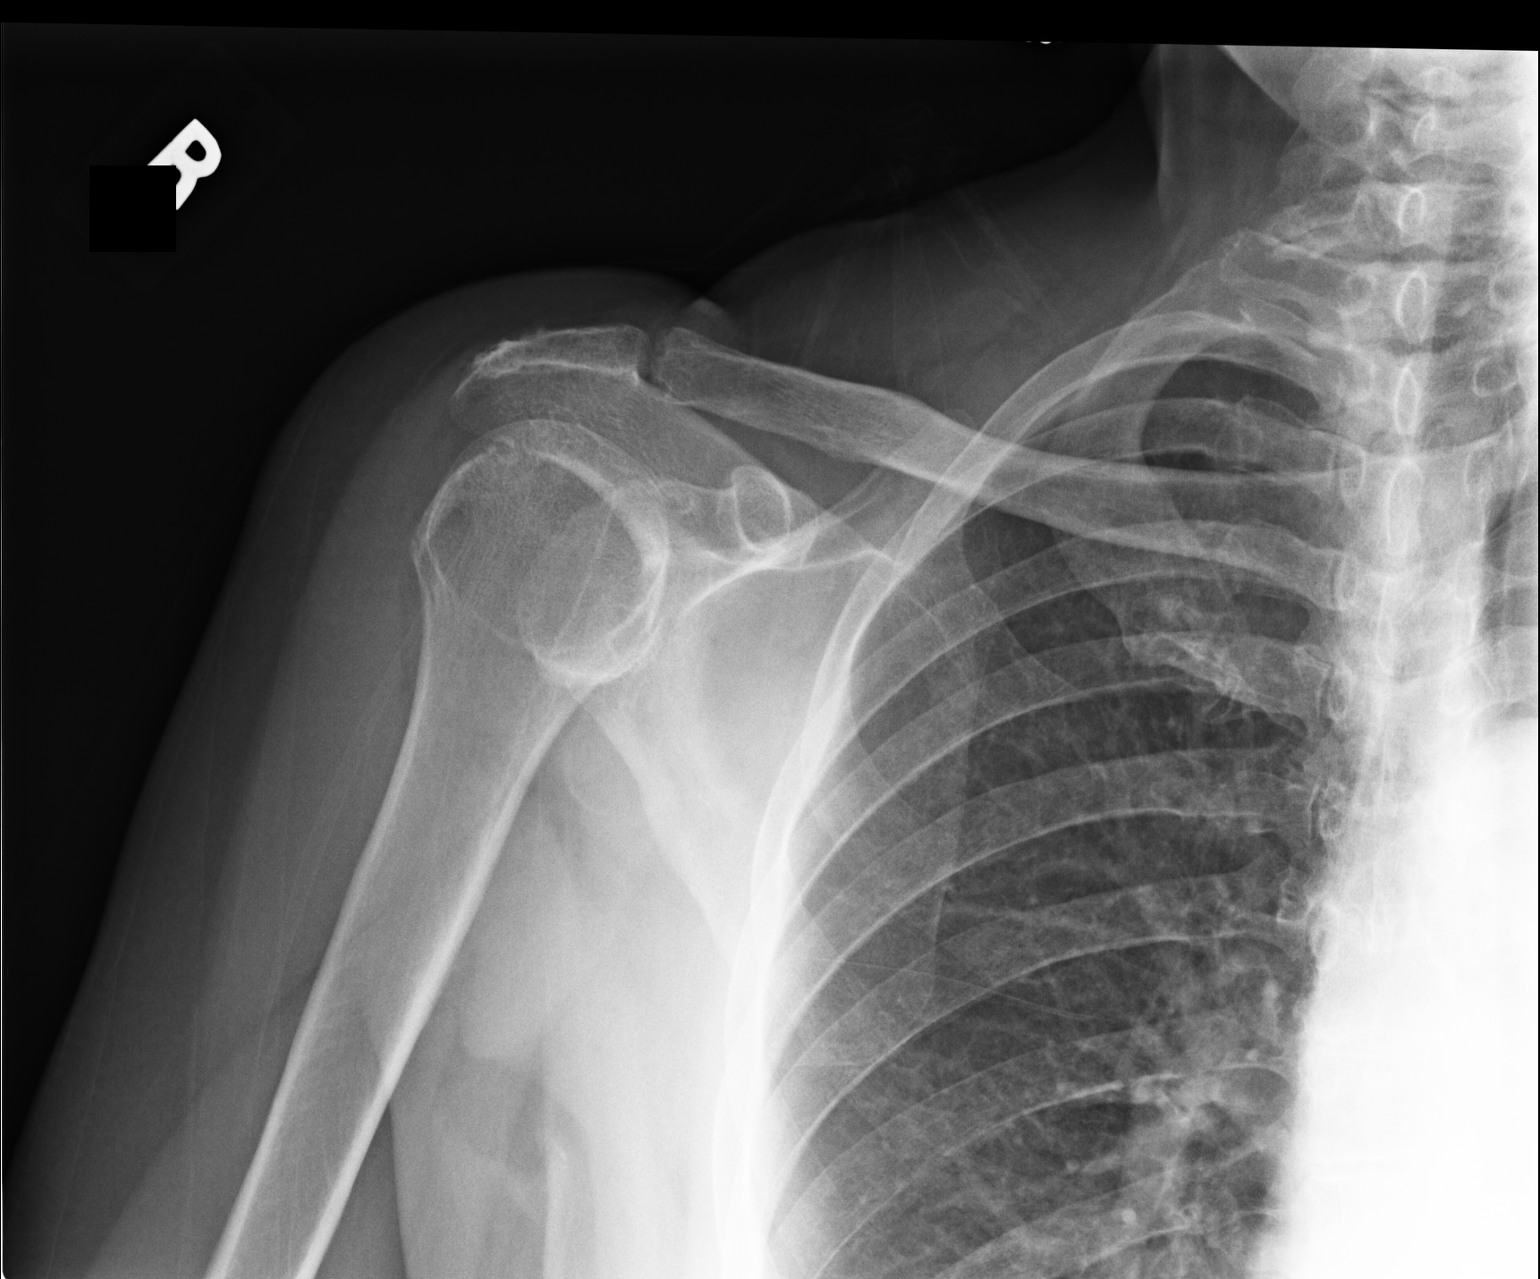
[im 2/2]
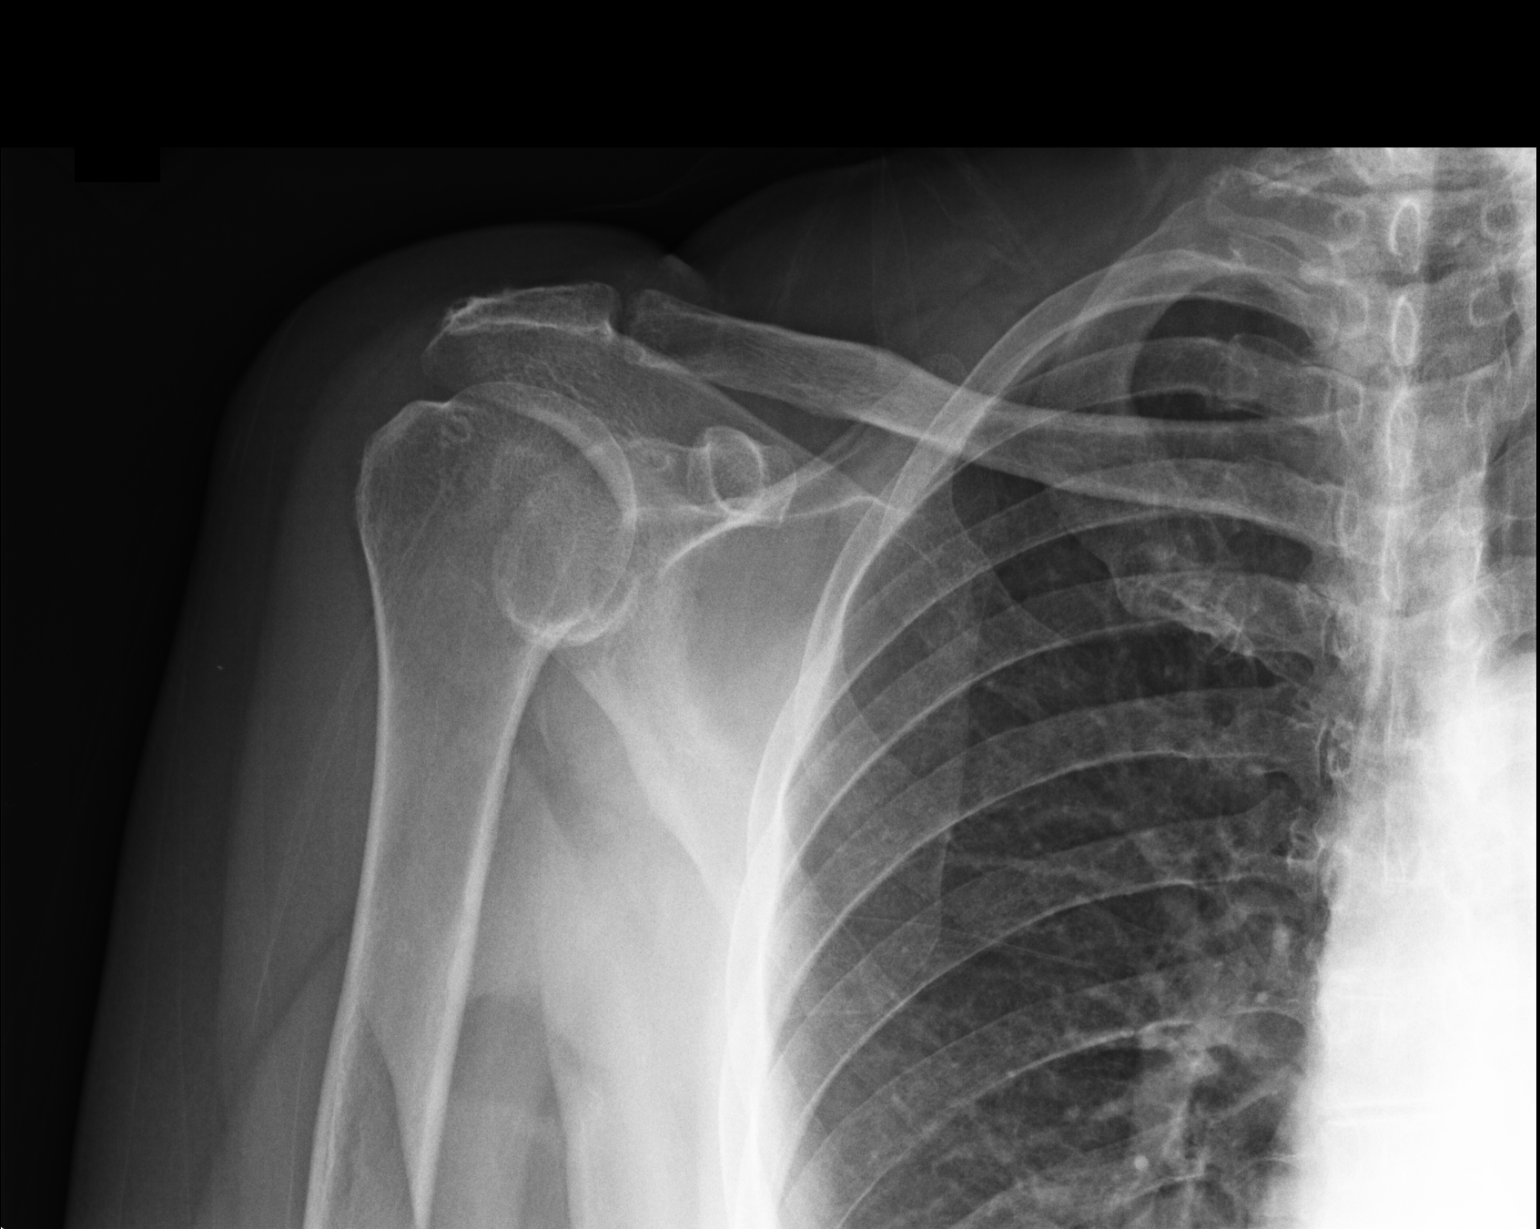

[2 of 2 positions shown; findings below may reference images not displayed]

FINDINGS: There is no acute fracture or subluxation. Glenohumeral articulation is well maintained. There is moderate acromioclavicular joint osteoarthritis. Visualized right lung is clear. Surrounding soft tissues are unremarkable.
IMPRESSION: Moderate acromioclavicular joint osteoarthritis.

## 2017-10-28 ENCOUNTER — Ambulatory Visit: Payer: Medicare (Managed Care) | Attending: Surgery | Admitting: Surgery

## 2017-10-28 DIAGNOSIS — D0512 Intraductal carcinoma in situ of left breast: Secondary | ICD-10-CM

## 2017-10-28 DIAGNOSIS — Z7189 Other specified counseling: Secondary | ICD-10-CM

## 2017-10-28 DIAGNOSIS — Z09 Encounter for follow-up examination after completed treatment for conditions other than malignant neoplasm: Secondary | ICD-10-CM

## 2017-10-28 DIAGNOSIS — Z9889 Other specified postprocedural states: Secondary | ICD-10-CM

## 2017-10-31 NOTE — Progress Notes (Signed)
Post Operative Visit      History of Present Illness    Today I had the pleasure of seeing LANELLE LINDO who underwent revision of her bilateral breast reconstructions with silicone implant (Allergan SCM-600) on 07/25/17 and returns today for a post operative visit. She is doing well.  Denies any concerns.    Physical Exam  Vital signs: There were no vitals taken for this visit.  General appearance: Alert, well appearing, and in no distress.   Skin: Skin color, texture and turgor are overall normal for age.  Focused detailed exam of bilateral breasts: Well healed incisions to bilateral breast reconstructions.  Implants in excellent position with excellent symmetry.  No capsular contracture.  No masses or cutaneous lesions.      Surgical pathology:     Surgical Pathology Accession#: [id number]   Additional Copy to:   Geraldo Pitter, MD     FINAL DIAGNOSIS:   (A) Skin, right breast, excision:   - Dermal scar and foreign body giant cell reaction.     (B) Skin, left breast, excision:   - Dermal scar and foreign body giant cell reaction.     Received from [facility], [place],   [place], [place] [address]; reviewed and completed at [facility], [address] [place], [place], [place].       INDICATIONS FOR PROCEDURE/CLINICAL HISTORY:   S/P breast reconstruction, bilateral [Z98.890]            Assessment and Recommendations    VIKKIE GOEDEN is a 67 y.o. female here for a post operative visit.  Now 3 months s/p silicone implant exchange.  She is doing well. We reviewed the option of nipple reconstruction and NAC tattooing.  She is not interested in either at this time.  We reviewed the manufacturer's recommendations for MRI surveillance of implants 3 years after implantation then every 2 years thereafter.  F/u 1 year.        [place], PA  Plastic Surgery

## 2018-01-01 IMAGING — MG 3D SCREENING MAMMO BIL W/CAD
5 series · 8 of 24 positions shown · non-contrast
Comparison: 06/18/2019

------------- REPORT [id number] -------------
EXAM:  3D BILATERAL ANNUAL SCREENING DIGITAL MAMMOGRAM WITH TOMOSYNTHESIS AND CAD
INDICATION: Screening.

[R CC · right · 0.10mm/px · 2 of 2 slices shown]
[im 1/2]
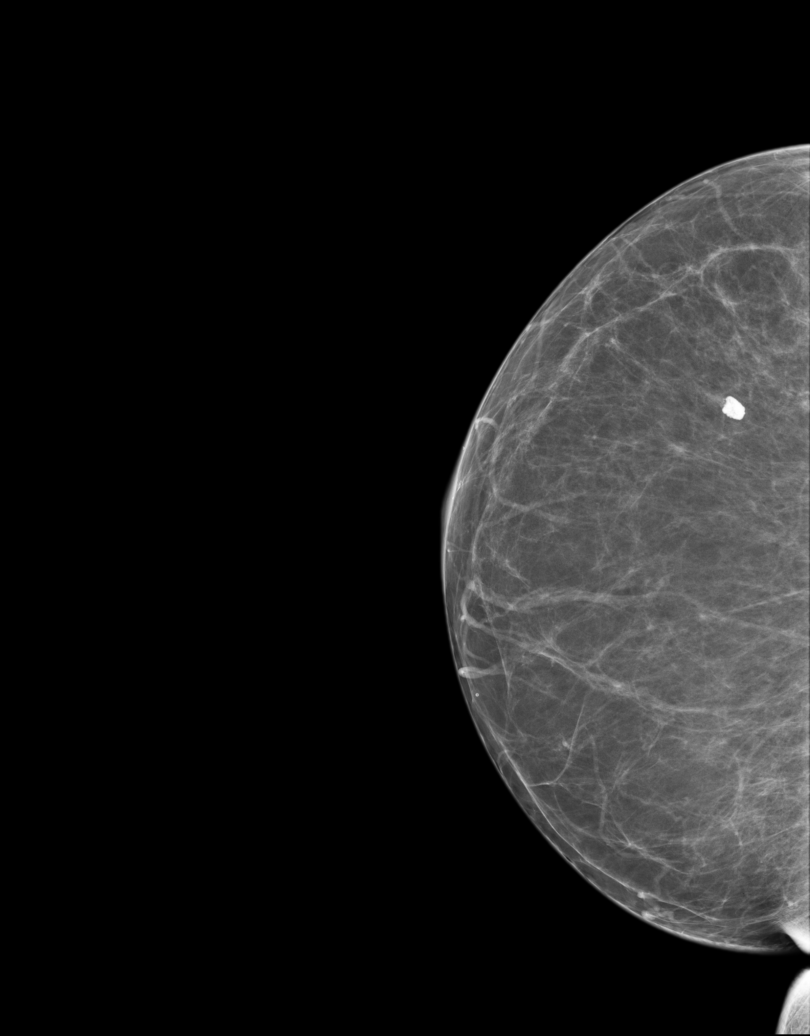
[im 2/2]
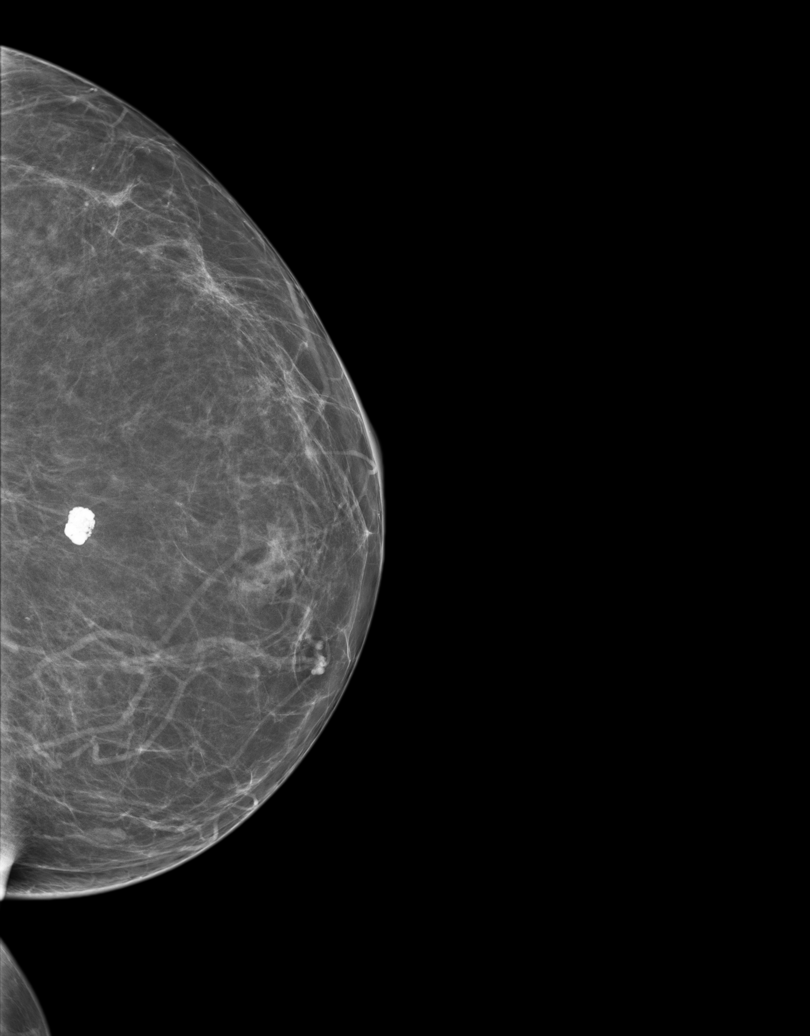

[3D SCREENING MAMMO BIL W/CAD · 2 acquisitions, 3 frames shown (1 of 2)]
[im 1/2]
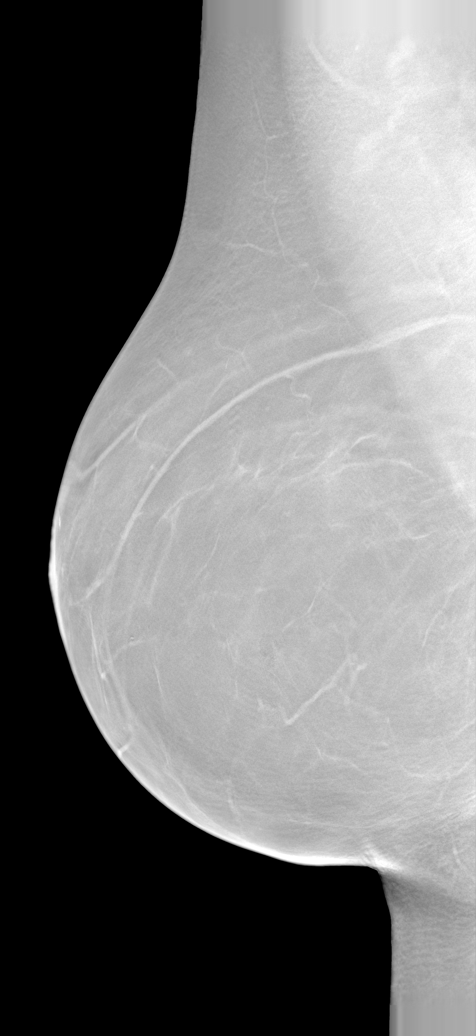
[im 2/2]
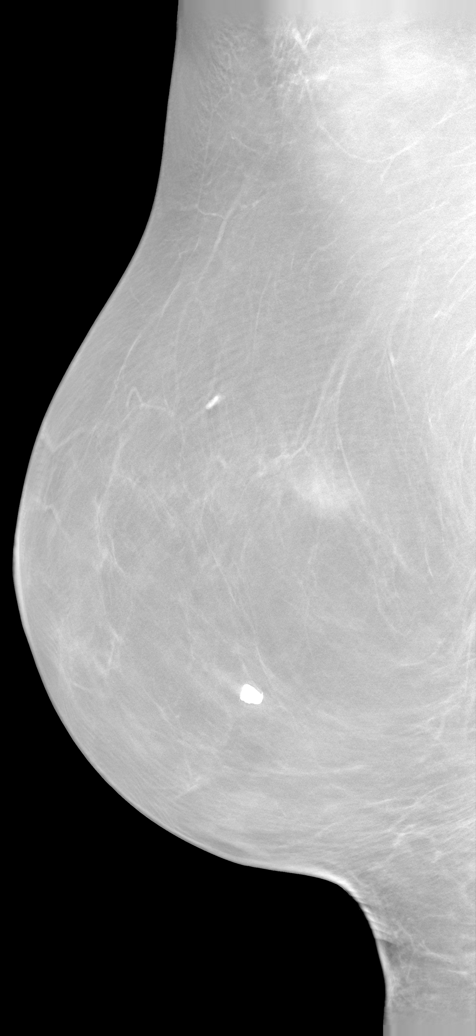
[im 2/2]
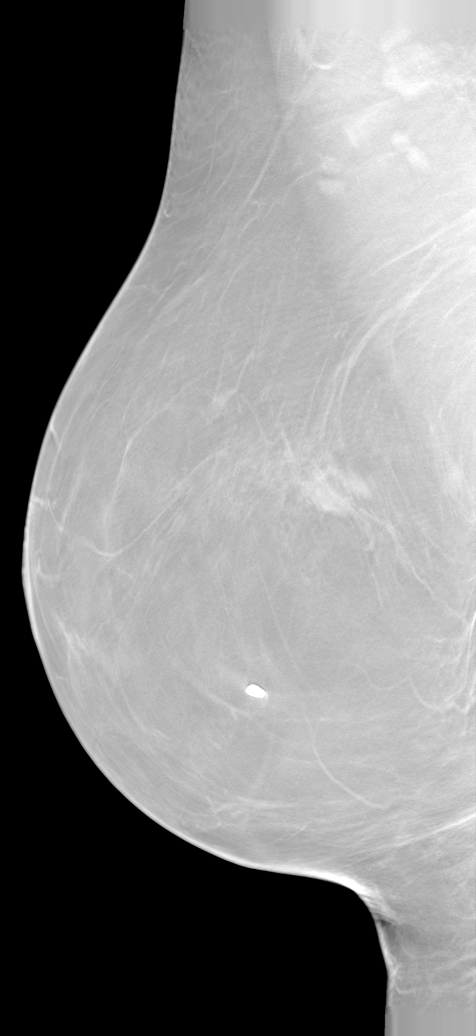

[R]
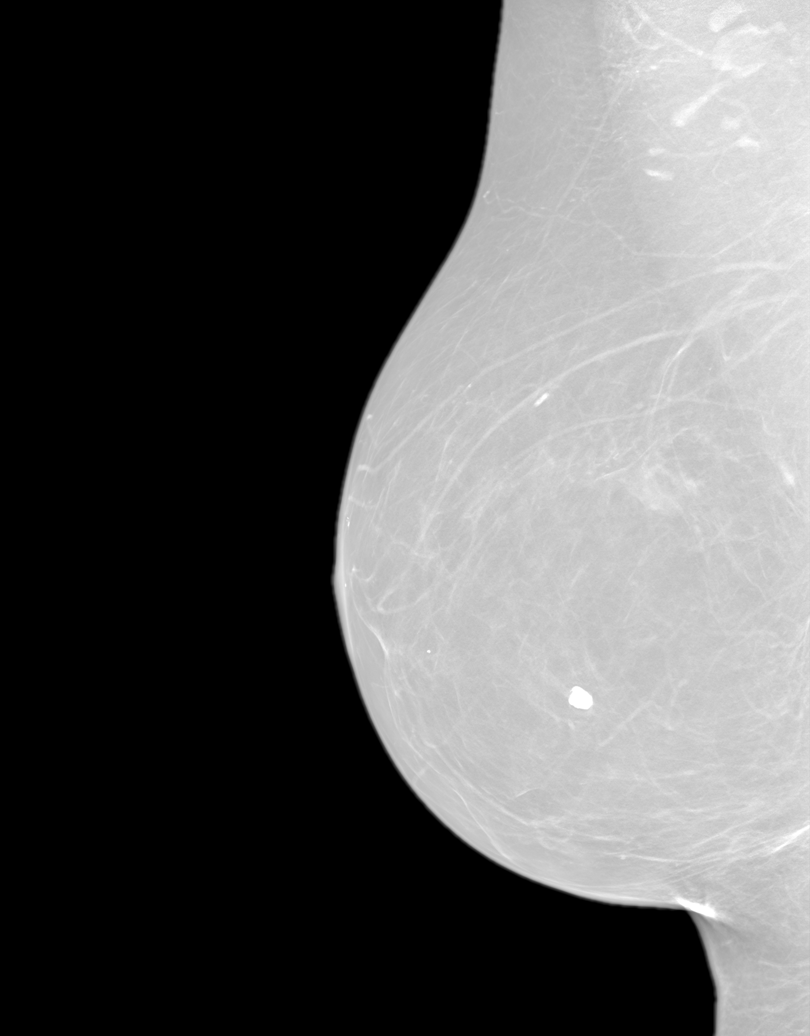

[3D SCREENING MAMMO BIL W/CAD (2 of 2) · tomo slice 13/79.0]
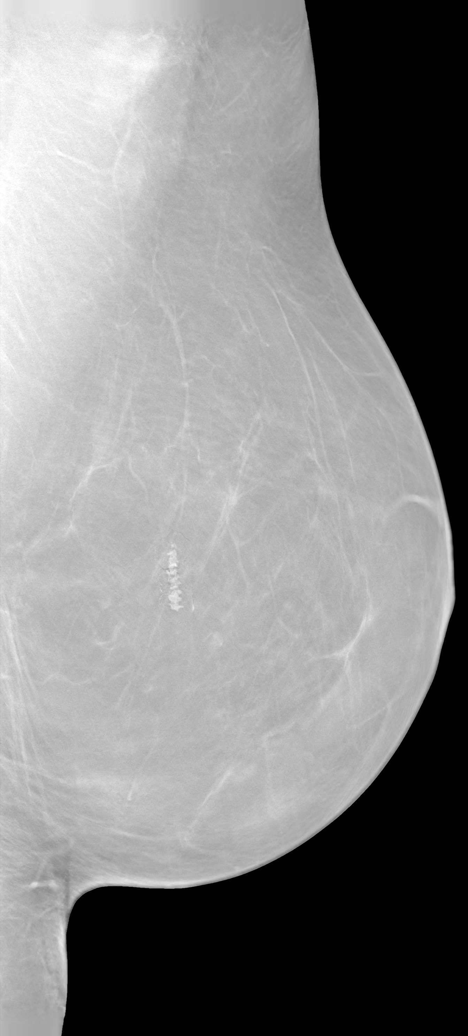

[L]
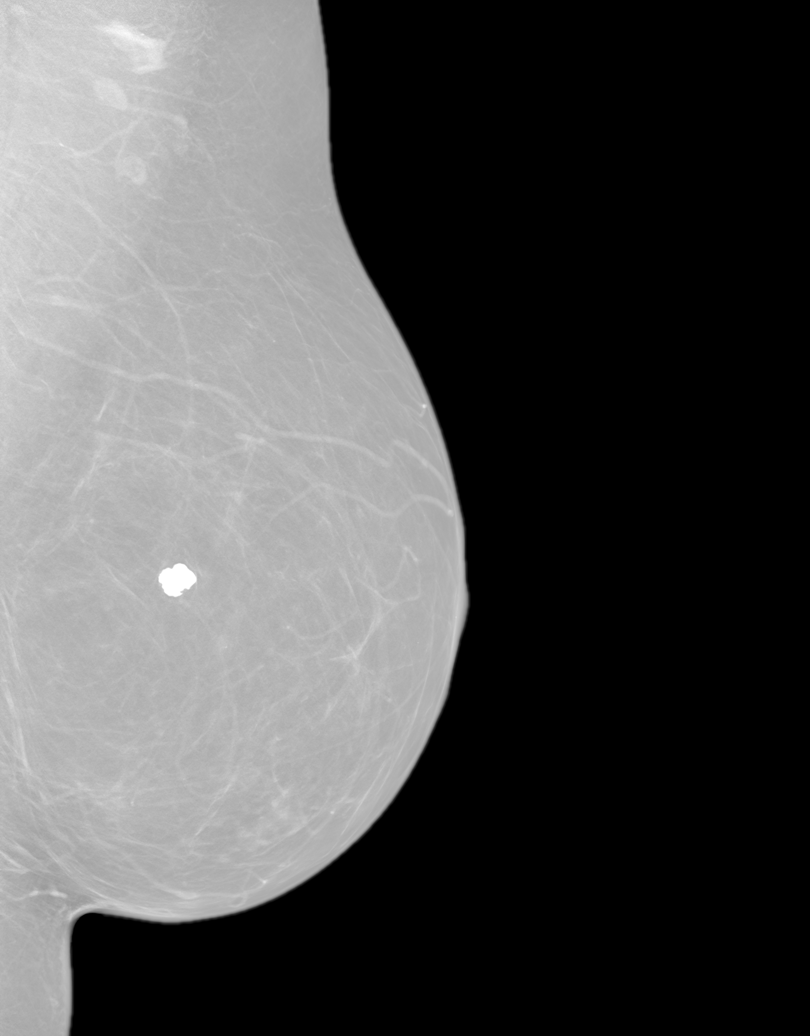

[8 of 24 positions shown; findings below may reference images not displayed]

FINDINGS: A screening mammogram is performed.  The examination shows involutional change consisting of fatty replacement of the glandular tissue bilaterally.  There are large, coarse, benign calcifications bilaterally.  Comparison is made with an earlier study dated 06/18/2019.  No interval development of a focal mass or malignant calcifications is seen.  3D tomosynthesis shows no evidence for an occult mass or calcifications.
IMPRESSION: 1.  Normal stable screening mammogram. 

2.  ACR BREAST DENSITY:  A (Almost entirely fatty).

3.  BIRADS 2-Benign findings. Patient has been added in a reminder system with a target date for the next screening mammography.

4.  Management recommendation:  Routine mammographic screening.   

Final Assessment Code:

Bi-Rads 2 

BI-RADS 0
Need additional imaging evaluation

BI-RADS 1
Negative mammogram

BI-RADS 2
Benign finding

BI-RADS 3
Probably benign finding: short-interval follow-up suggested

BI-RADS 4
Suspicious abnormality:  biopsy should be considered

BI-RADS 5
Highly suggestive of malignancy; appropriate action should be taken

BI-RADS 6
Known Biopsy-proven Malignancy – Appropriate action should be taken

NOTE:
In compliance with Federal regulations, the results of this mammogram are being sent to the patient.

------------- REPORT [id number] -------------
[facility]
[address]
We wish to report the following on your recent mammography examination. We are sending a report to your referring physician or other health care provider. 
(       Normal/Negative:
No evidence of cancer.
This statement is mandated by the [place], Department of Health.
Your examination was performed by one of our technologists, who are registered radiological technologists and also specially certified in mammography:
___
Frank, Zarina (M)
___
Huidrom, Rutik (M)

Your mammogram was interpreted by our radiologist.

( 
Jean Rousso Aniece, M.D.

(Annual Breast Examination by a physician or other health care provider
(Annual Mammography Screening beginning at age 40
(Monthly Breast Self Examination

## 2018-03-12 ENCOUNTER — Other Ambulatory Visit
Admission: RE | Admit: 2018-03-12 | Discharge: 2018-03-12 | Disposition: A | Discharge status: Home / Self Care | Payer: Medicare (Managed Care) | Source: Ambulatory Visit | Attending: Internal Medicine | Admitting: Internal Medicine

## 2018-03-12 DIAGNOSIS — E039 Hypothyroidism, unspecified: Secondary | ICD-10-CM | POA: Insufficient documentation

## 2018-03-12 LAB — TSH: TSH: 2.38 u[IU]/mL (ref 0.27–4.20)

## 2018-03-26 ENCOUNTER — Telehealth: Payer: Self-pay

## 2018-03-26 NOTE — Telephone Encounter (Signed)
Writer has attempted to call patient 3 times today to confirm appt change with her to 2/18 at 11:30am with [facility]. Patient has no vm set up. Writer will send pt a my chart message is available.

## 2018-03-27 NOTE — Progress Notes (Deleted)
[name]  [id number]    REFERRING PHYSICIAN: Unknown, Provider    HPI: [name] is a lovely 68 y.o. woman seen on 04/01/2018  at [place] at [facility] for routine follow-up. She is currently 12 months S/P bilateral skin sparing Mastectomy  Sentinel node biopsy with Immediate Post Mastectomy Tissue Expander/Implant reconstruction for Cancer Staging  Ductal carcinoma in situ (DCIS) of left breast  Staging form: Breast, AJCC 8th Edition  - Clinical stage from 02/28/2017: Stage Unknown (cTis (DCIS), cNX, cM0, ER: Positive, PR: Positive, HER2: Not assessed ) - Signed by [name], NP on 03/08/2017  - Pathologic stage from 04/07/2017: Stage 0 (pTis (DCIS), pN0(sn), cM0, ER: Positive, PR: Positive, HER2: Not assessed ) - Signed by [name], MD on 04/07/2017  . Her oncologic history is as follows:       Ductal carcinoma in situ (DCIS) of left breast    02/28/2017 Surgery     Left excisional breast biopsy for atypia (Dr. Freda Munro)    Cancer Staging  Ductal carcinoma in situ (DCIS) of left breast  Staging form: Breast, AJCC 8th Edition  - Clinical stage from 02/28/2017: Stage Unknown (cTis (DCIS), cNX, cM0, ER: Positive, PR: Positive, HER2: Not assessed ) - Signed by [name], NP on 03/08/2017  : Tumor size: 62m.  ER: 99%, strong staining; PR: 99%, strong staining;  cribiform and micropapillary type, NG1 with focal, punctate necrosis; closest margin: posterior (<130m, lateral (104m3104mand medial (<104mm17m     02/28/2017 Initial Diagnosis     Ductal carcinoma in situ (DCIS) of left breast      02/28/2017 Significant Lab Findings     Cancer Staging  Ductal carcinoma in situ (DCIS) of left breast  Staging form: Breast, AJCC 8th Edition  - Clinical stage from 02/28/2017: Stage Unknown (cTis (DCIS), cNX, cM0, ER: Positive, PR: Positive, HER2: Not assessed ) - Signed by [name] on 03/08/2017  : ER: 99%, strong staining; PR: 99%, strong staining;   In situ component: 100%,  cribiform type, NG1 with necrosis, 6cm. Closest margin: posterior <104mm.65m    03/28/2017 Surgery     Bilateral total mastectomy, left sentinel node biopsy, immediate tissue expander reconstruction- Ann OMinus LibertyHowarMarland Mcalpine  03/28/2017 Significant Lab Findings     Cancer Staging  Ductal carcinoma in situ (DCIS) of left breast  Staging form: Breast, AJCC 8th Edition  - Clinical stage from 02/28/2017: Stage Unknown (cTis (DCIS), cNX, cM0, ER: Positive, PR: Positive, HER2: Not assessed ) - Signed by RegisOralia Manison 03/08/2017  - Pathologic stage from 04/07/2017: Stage 0 (pTis (DCIS), pN0(sn), cM0, ER: Positive, PR: Positive, HER2: Not assessed ) - Signed by [name] 04/07/2017  : Tumor size: 90mm.19m: 99%, strong staining; PR: 99%, strong staining;  Cribiform, micropapillary, papillary type, NG1 w  ith necrosis; without skin involvement, without nipple involvement, without muscle involvement. Closest margin: posterior ,104mm; 02mSLN/ALN, USC/VNPI: (Age score = 1) + (Path score = 1) + (Margin score = 3) + (Size score = 3) = 8, intermediate risk.          She is without complaints.     OR    She complains of {Menopause s/s:16166}. She rates her cosmetic satisfaction as ***/5 (***).      She is in good health and spirits and has no new symptoms referable to her breasts.  OR  Since her last visit with [place] she has developed {rosbreast symptoms:311037}, {Symptoms; breast ca:31379}, {ROS - COMPLETE:21411}.  ***.    She {has/denies:315300} arm issues including heaviness, swelling, infections, changes in how her clothes or jewelry fit, range of motion issues.    Past medical and surgical history, social and family history: These were documented on the patient intake form and reviewed with the patient and notes no significant changes since her last visit with [place].     MEDICATIONS:   Current Outpatient Medications   Medication Sig    oxyCODONE (OXY-IR) 5 MG immediate release capsule Take 1  capsule (5 mg total) by mouth every 6 hours as needed for Pain   Max daily dose: 20 mg    ibuprofen (ADVIL,MOTRIN) 400 MG tablet Take 2 tablets (800 mg total) by mouth 3 times daily as needed for Pain    acetaminophen (TYLENOL) 500 mg tablet Take 2 tablets (1,000 mg total) by mouth 3 times daily    melatonin 3 MG Take 3 mg by mouth nightly as needed for Sleep    aspirin 325 MG EC tablet Take 1 tablet (325 mg total) by mouth daily    levothyroxine (SYNTHROID, LEVOTHROID) 100 MCG tablet Take 112 mcg by mouth daily (before breakfast)        No current facility-administered medications for this visit.        ALLERGIES: has No Known Allergies (drug, envir, food or latex).    REVIEW OF SYSTEMS: Comprehensive ROS was taken on the patient intake form and reviewed with the patient.  {Ros - Complete:30496}    PHYSICAL EXAMINATION:  Vitals: There were no vitals taken for this visit.  General appearance: {general exam:16600::"alert","appears stated age","cooperative"}  Head: {head exam:30909::"Normocephalic, without obvious abnormality","atraumatic"}  Eyes: conjunctivae/corneas clear. PERRL, EOM's intact.  Ears: External ears are normal, hearing is grossly intact.  Nose: Nares normal and patent.  Throat: {throat exam:17160::"lips, mucosa, and tongue normal; teeth and gums normal"}  Neck: {neck exam:17463::"no adenopathy","no carotid bruit","no JVD","supple, symmetrical, trachea midline","thyroid not enlarged, symmetric, no tenderness/mass/nodules"}  Back: {back exam:801::"symmetric, no curvature. ROM normal. No CVA tenderness."}  Lungs: {lung exam:16931::"clear to auscultation bilaterally"}  Heart: {heart exam:5510::"regular rate and rhythm, S1, S2 normal, no murmur, click, rub or gallop"}  Abdomen: Soft, nontender, nondistended, no organomegaly, no palpable masses.  Extremities: {extremity exam:5109::"extremities normal, atraumatic, no cyanosis or edema"}. There {IS/IS NO:22776::"is"} evidence of lymphedema. ROM is {Rom  min/mod/sev:14285}  Pulses: {pulse exam:10866::"2+ and symmetric"}  Skin: {skin exam:31329::"Skin color, texture, turgor normal. No rashes or lesions"}  Lymph nodes: {lymph node exam:14039::"Cervical, supraclavicular, and axillary nodes normal."}  Neurologic: {neuro exam:17854::"Grossly normal"}  ***    LABS:    Lab Results   Component Value Date/Time    VID25 17 (L) 03/01/2010 08:38 AM       Imaging:   {Right, left-initial cap:5607} screening OR diagnostic mammogram performed on *** at *** showed {MODEL RIS BREAST DENSITY:19108} with postoperative changes on the {Left/right:33004}, but no masses, areas of architectural distortion, asymmetric densities, suspicious microcalicifications, or other stigmata of malignancy.  OR without  masses, areas of architectural distortion, asymmetric densities, suspicious microcalicifications, or other stigmata of malignancy.   OR  {Right, left-initial cap:5607} screening OR diagnostic mammogram performed on *** at *** showed {MODEL RIS BREAST DENSITY:19108}  with ***  {Mammographic changes:12824} in the {BREAST LOCATION:21339} of the {Desc; right/left/bilateral:14622} breast. BIRAD category {Numbers; 7-3:22025}, ***.     DEXA scan on *** at *** shows ***.    Pathology: Review  of pathology from a ***  on ***  shows ***.     Impression: Clinically NED ***     Plan:  1) Surveillance plan: Jissell continues to be seen on an every *** month basis with {DESC; RIGHT/LEFT/BILATERAL:21501} mammogram annually. She is scheduled to see Dr Marland Kitchen in  {Time; month:10108}, Dr Marland Kitchen in {Time; month:10108}, and we will see her back in {Time; month:10108}. {Desc; his/her:32168} next *** mammogram is due in {Time; month:10108} at {list; not needed/ [facility]/ [facility]/..:22440} ***.  2)Vitamin D: The most recent value was *** on ***. Based on rather compelling epidemiologic data showing an association between low vitamin D levels and breast cancer risk, we recommend that levels be maintained at the mid to high end  of the normal range (40-60). We therefore recommend that she continue her current regimen. OR increase her current intake to *** units per day. OR begin replacement therapy consisting of 93716 units *** a week for twelve weeks. We will recheck her levels in {NUMBERS 1-12:18279} months.  3) LDEX: Her next LDEX is due in ***.

## 2018-03-31 ENCOUNTER — Ambulatory Visit: Payer: Medicare (Managed Care) | Admitting: Surgical Oncology

## 2018-03-31 ENCOUNTER — Telehealth: Payer: Self-pay | Admitting: Surgical Oncology

## 2018-03-31 NOTE — Telephone Encounter (Signed)
Pt would like to have her follow up appointment on 2/18 with Dr. Bennie Hind. Please call to reschedule

## 2018-04-01 ENCOUNTER — Ambulatory Visit: Payer: Medicare (Managed Care) | Admitting: Physician Assistant

## 2018-04-25 ENCOUNTER — Ambulatory Visit: Payer: Medicare (Managed Care) | Attending: Surgical Oncology | Admitting: Surgical Oncology

## 2018-04-25 ENCOUNTER — Encounter: Payer: Self-pay | Admitting: Surgical Oncology

## 2018-04-25 VITALS — BP 126/84 | HR 88 | Temp 97.3°F | Wt 170.9 lb

## 2018-04-25 DIAGNOSIS — D0512 Intraductal carcinoma in situ of left breast: Secondary | ICD-10-CM

## 2018-04-25 NOTE — Progress Notes (Signed)
NIAH HEINLE  [id number]    REFERRING PHYSICIAN: Unknown, Provider    HPI: [name] is a lovely 68 y.o. woman seen on 04/25/2018  at [place] at [facility] for routine follow-up. She had bilateral mastectomies for DCIS about a year ago. She has no concerns related to breast cancer today. She denies lumps, masses or skin changes. She is reasonably satisfied with the outcome of her reconstructions. There has been no other change in her health or medications since her last visit. Her oncologic history is as follows:       Ductal carcinoma in situ (DCIS) of left breast    02/28/2017 Surgery     Left excisional breast biopsy for atypia (Dr. Freda Munro)    Cancer Staging  Ductal carcinoma in situ (DCIS) of left breast  Staging form: Breast, AJCC 8th Edition  - Clinical stage from 02/28/2017: Stage Unknown (cTis (DCIS), cNX, cM0, ER: Positive, PR: Positive, HER2: Not assessed ) - Signed by [name], NP on 03/08/2017  : Tumor size: 45m.  ER: 99%, strong staining; PR: 99%, strong staining;  cribiform and micropapillary type, NG1 with focal, punctate necrosis; closest margin: posterior (<138m, lateral (24m34mand medial (<24mm62m     02/28/2017 Initial Diagnosis     Ductal carcinoma in situ (DCIS) of left breast      02/28/2017 Significant Lab Findings     Cancer Staging  Ductal carcinoma in situ (DCIS) of left breast  Staging form: Breast, AJCC 8th Edition  - Clinical stage from 02/28/2017: Stage Unknown (cTis (DCIS), cNX, cM0, ER: Positive, PR: Positive, HER2: Not assessed ) - Signed by [name] on 03/08/2017  : ER: 99%, strong staining; PR: 99%, strong staining;   In situ component: 100%, cribiform type, NG1 with necrosis, 6cm. Closest margin: posterior <24mm.37m    03/28/2017 Surgery     Bilateral total mastectomy, left sentinel node biopsy, immediate tissue expander reconstruction- Kathey Simer OMinus LibertyHowarMarland Mcalpine  03/28/2017 Significant Lab Findings     Cancer Staging  Ductal  carcinoma in situ (DCIS) of left breast  Staging form: Breast, AJCC 8th Edition  - Clinical stage from 02/28/2017: Stage Unknown (cTis (DCIS), cNX, cM0, ER: Positive, PR: Positive, HER2: Not assessed ) - Signed by RegisOralia Manison 03/08/2017  - Pathologic stage from 04/07/2017: Stage 0 (pTis (DCIS), pN0(sn), cM0, ER: Positive, PR: Positive, HER2: Not assessed ) - Signed by [name] 04/07/2017  : Tumor size: 90mm.7m: 99%, strong staining; PR: 99%, strong staining;  Cribiform, micropapillary, papillary type, NG1 w  ith necrosis; without skin involvement, without nipple involvement, without muscle involvement. Closest margin: posterior ,24mm; 060mSLN/ALN, USC/VNPI: (Age score = 1) + (Path score = 1) + (Margin score = 3) + (Size score = 3) = 8, intermediate risk.           Past medical and surgical history, social and family history: These were documented on the patient intake form and reviewed with the patient and notes no significant changes since her last visit with us.    [place]:   Current Outpatient Medications   Medication Sig    fluticasone (FLONASE) 50 MCG/ACT nasal spray 1 spray by Nasal route daily as needed for Rhinitis    venlafaxine (EFFEXOR) 100 MG tablet Take 150 mg by mouth daily    hydrOXYzine HCl (ATARAX) 25 MG tablet Take 25 mg by mouth 3  times daily as needed for Itching    ibuprofen (ADVIL,MOTRIN) 400 MG tablet Take 2 tablets (800 mg total) by mouth 3 times daily as needed for Pain    melatonin 3 MG Take 3 mg by mouth nightly as needed for Sleep    levothyroxine (SYNTHROID, LEVOTHROID) 100 MCG tablet Take 112 mcg by mouth daily (before breakfast)       acetaminophen (TYLENOL) 500 mg tablet Take 2 tablets (1,000 mg total) by mouth 3 times daily (Patient not taking: Reported on 04/25/2018)    aspirin 325 MG EC tablet Take 1 tablet (325 mg total) by mouth daily (Patient not taking: Reported on 04/25/2018)     No current facility-administered medications for this visit.         ALLERGIES: has No Known Allergies (drug, envir, food or latex).    REVIEW OF SYSTEMS: Comprehensive ROS was taken on the patient intake form and reviewed with the patient.  Pertinent items are noted in HPI.    PHYSICAL EXAMINATION:  Vitals: BP 126/84 (BP Location: Right arm, Patient Position: Sitting, Cuff Size: adult)    Pulse 88    Temp 36.3 C (97.3 F) (Temporal)    Wt 77.5 kg (170 lb 13.7 oz)    SpO2 96% Comment: room air   BMI 32.28 kg/m   General appearance: alert, appears stated age and cooperative  Head: Normocephalic, without obvious abnormality, atraumatic  Eyes: conjunctivae/corneas clear.  Ears: External ears are normal, hearing is grossly intact.  Nose: Nares normal and patent.  Neck: no adenopathy, supple, symmetrical, trachea midline and thyroid not enlarged, symmetric, no tenderness/mass/nodules  Lungs: clear to auscultation bilaterally  Heart: regular rate and rhythm, S1, S2 normal, no murmur, click, rub or gallop  Abdomen: Soft, nontender, nondistended, no organomegaly, no palpable masses.  Extremities: extremities normal, atraumatic, no cyanosis or edema. There is not evidence of lymphedema. ROM is within normal limits  Skin: Skin color, texture, turgor normal. No rashes or lesions  Lymph nodes: Cervical, supraclavicular, and axillary nodes normal.  Reconstructed breasts: Symmetric, no skin changes, no masses.     LABS:    Lab Results   Component Value Date/Time    VID25 17 (L) 03/01/2010 08:38 AM       Impression and plan:   No evidence of disease, low risk. Ongoing imaging only related to the reconstructions per Dr. Varney Daily. Follow up 1 year unless she has concerns sooner.

## 2018-05-02 ENCOUNTER — Ambulatory Visit: Payer: Medicare (Managed Care) | Admitting: Podiatry

## 2018-05-02 ENCOUNTER — Telehealth: Payer: Self-pay

## 2018-05-02 NOTE — Telephone Encounter (Signed)
Left vm for pt to call office to schedule an urgent appointment today with Dr. Dellia Nims in [place]

## 2018-05-02 NOTE — Telephone Encounter (Signed)
-----   Message from Jolee Ewing, DPM sent at 05/02/2018  9:42 AM EDT -----  Regarding: RE: URGENT APPOINTMENT  Send her over  ----- Message -----  From: Marletta Lor  Sent: 05/02/2018   9:24 AM EDT  To: Jolee Ewing, DPM  Subject: FW: URGENT APPOINTMENT                           Would you see this patient urgently?    Please advise    Thank you!  ----- Message -----  From: Maylon Cos  Sent: 05/02/2018   9:17 AM EDT  To: Aleene Davidson, Marletta Lor  Subject: URGENT APPOINTMENT                               Good morning Bernell Sigal,     Patient states that she is having right toe pain and believe she has a possible infection, toe is bright red, swollen and is warm to the touch and it has been like this for about a week. Please advise if Dr. Dellia Nims would see patient urgently today .    Thank you   Crystal

## 2018-07-14 ENCOUNTER — Encounter: Payer: Self-pay | Admitting: Surgery

## 2018-10-27 ENCOUNTER — Ambulatory Visit: Payer: Medicare (Managed Care) | Admitting: Surgery

## 2018-10-27 DIAGNOSIS — Z9889 Other specified postprocedural states: Secondary | ICD-10-CM

## 2018-10-27 NOTE — Progress Notes (Signed)
Progress Note       History of Present Illness    Today I had the pleasure of seeing [name] who underwent revision of her bilateral breast reconstructions with silicone implant (Allergan SCM-600) on 07/25/17 and returns today for a routine follow-up visit. She is doing well.  Denies any concerns.    Physical Exam  Vital signs: There were no vitals taken for this visit.  General appearance: Alert, well appearing, and in no distress.   Skin: Skin color, texture and turgor are overall normal for age.  Focused detailed exam of bilateral breasts: Well healed incisions to bilateral breast reconstructions.  Implants in excellent position with excellent symmetry.  No capsular contracture.  No masses or cutaneous lesions.      Surgical pathology:     Surgical Pathology Accession#: [id number]   Additional Copy to:   Geraldo Pitter, MD     FINAL DIAGNOSIS:   (A) Skin, right breast, excision:   - Dermal scar and foreign body giant cell reaction.     (B) Skin, left breast, excision:   - Dermal scar and foreign body giant cell reaction.     Received from [facility], [place],   [place], [place] [address]; reviewed and completed at [facility], [address], [place], [place], [place].       INDICATIONS FOR PROCEDURE/CLINICAL HISTORY:   S/P breast reconstruction, bilateral [Z98.890]            Assessment and Recommendations    [name] is a 68 y.o. female here for a routine follow-up visit.  Now 15 months s/p silicone implant exchange.  She is doing well. We reviewed the option of nipple reconstruction and NAC tattooing.  She is not interested in either at this time.  We reviewed the manufacturer's recommendations for MRI surveillance of implants 3 years after implantation then every 2 years thereafter.  F/u 12-15 months, at that time will order surveillance MRI.       Shelby Mattocks, MD  Plastic Surgery    I saw and evaluated the  patient. I have reviewed and edited the resident's/fellow's note and confirm the findings and plan of care as documented above.    Hendricks Milo, MD 10/27/2018 5:20 PM

## 2018-12-25 ENCOUNTER — Other Ambulatory Visit: Payer: Self-pay | Admitting: Gastroenterology

## 2019-01-23 ENCOUNTER — Other Ambulatory Visit
Admission: RE | Admit: 2019-01-23 | Discharge: 2019-01-23 | Disposition: A | Discharge status: Home / Self Care | Payer: Medicare (Managed Care) | Source: Ambulatory Visit | Attending: Family Medicine | Admitting: Family Medicine

## 2019-01-23 DIAGNOSIS — E039 Hypothyroidism, unspecified: Secondary | ICD-10-CM | POA: Insufficient documentation

## 2019-01-23 LAB — LIPID PANEL
Chol/HDL Ratio: 2.7
Cholesterol: 238 mg/dL — AB
HDL: 88 mg/dL — ABNORMAL HIGH (ref 40–60)
LDL Calculated: 132 mg/dL — AB
Non HDL Cholesterol: 150 mg/dL
Triglycerides: 89 mg/dL

## 2019-01-23 LAB — TSH: TSH: 3.14 u[IU]/mL (ref 0.27–4.20)

## 2019-01-23 LAB — T4, FREE: Free T4: 1.2 ng/dL (ref 0.9–1.7)

## 2019-04-26 ENCOUNTER — Encounter: Payer: Self-pay | Admitting: Surgical Oncology

## 2019-04-27 ENCOUNTER — Ambulatory Visit: Payer: Medicare (Managed Care) | Admitting: Surgical Oncology

## 2019-04-27 ENCOUNTER — Telehealth: Payer: Self-pay | Admitting: Surgical Oncology

## 2019-04-27 NOTE — Telephone Encounter (Signed)
Patient calling to reschedule appointment for today, rescheduled for next Monday 3/22 @ 11

## 2019-05-01 ENCOUNTER — Ambulatory Visit: Payer: Medicare (Managed Care) | Admitting: Physician Assistant

## 2019-05-04 ENCOUNTER — Encounter: Payer: Self-pay | Admitting: Surgical Oncology

## 2019-05-04 ENCOUNTER — Ambulatory Visit: Payer: Medicare (Managed Care) | Admitting: Physician Assistant

## 2019-05-04 ENCOUNTER — Ambulatory Visit: Payer: Medicare (Managed Care) | Admitting: Surgical Oncology

## 2019-05-04 VITALS — BP 143/84 | HR 102 | Temp 97.5°F | Resp 20 | Ht 60.98 in | Wt 173.9 lb

## 2019-05-04 DIAGNOSIS — D0512 Intraductal carcinoma in situ of left breast: Secondary | ICD-10-CM

## 2019-05-04 NOTE — Progress Notes (Signed)
[facility] [place]    REFERRING PHYSICIAN: Mickey Farber, MD    HPI: [name] is a lovely 69 y.o. woman seen on 05/04/2019  at [place] at [facility] for routine follow-up. She is currently 1 year 1 month S/P bilateral Mastectomy and left sentinel lymph node biopsy with Immediate Post Mastectomy Tissue Expander/Implant reconstruction for Cancer Staging  Ductal carcinoma in situ (DCIS) of left breast  Staging form: Breast, AJCC 8th Edition  - Clinical stage from 02/28/2017: Stage Unknown (cTis (DCIS), cNX, cM0, ER: Positive, PR: Positive, HER2: Not assessed ) - Signed by [name], NP on 03/08/2017  - Pathologic stage from 04/07/2017: Stage 0 (pTis (DCIS), pN0(sn), cM0, ER: Positive, PR: Positive, HER2: Not assessed ) - Signed by [name], MD on 04/07/2017  . Her oncologic history is as follows:    Oncology History   Ductal carcinoma in situ (DCIS) of left breast   02/28/2017 Surgery    Left excisional breast biopsy for atypia (Dr. Freda Munro)    Cancer Staging  Ductal carcinoma in situ (DCIS) of left breast  Staging form: Breast, AJCC 8th Edition  - Clinical stage from 02/28/2017: Stage Unknown (cTis (DCIS), cNX, cM0, ER: Positive, PR: Positive, HER2: Not assessed ) - Signed by [name], NP on 03/08/2017  : Tumor size: 34m.  ER: 99%, strong staining; PR: 99%, strong staining;  cribiform and micropapillary type, NG1 with focal, punctate necrosis; closest margin: posterior (<123m, lateral (49m45mand medial (<49mm35m    02/28/2017 Initial Diagnosis    Ductal carcinoma in situ (DCIS) of left breast     02/28/2017 Significant Lab Findings    Cancer Staging  Ductal carcinoma in situ (DCIS) of left breast  Staging form: Breast, AJCC 8th Edition  - Clinical stage from 02/28/2017: Stage Unknown (cTis (DCIS), cNX, cM0, ER: Positive, PR: Positive, HER2: Not assessed ) - Signed by [name] on 03/08/2017  : ER: 99%, strong  staining; PR: 99%, strong staining;   In situ component: 100%, cribiform type, NG1 with necrosis, 6cm. Closest margin: posterior <49mm.19m   03/28/2017 Surgery    Bilateral total mastectomy, left sentinel node biopsy, immediate tissue expander reconstruction- Ann OMinus LibertyHowarMarland Mcalpine 03/28/2017 Significant Lab Findings    Cancer Staging  Ductal carcinoma in situ (DCIS) of left breast  Staging form: Breast, AJCC 8th Edition  - Clinical stage from 02/28/2017: Stage Unknown (cTis (DCIS), cNX, cM0, ER: Positive, PR: Positive, HER2: Not assessed ) - Signed by RegisOralia Manison 03/08/2017  - Pathologic stage from 04/07/2017: Stage 0 (pTis (DCIS), pN0(sn), cM0, ER: Positive, PR: Positive, HER2: Not assessed ) - Signed by [name] 04/07/2017  : Tumor size: 90mm.82m: 99%, strong staining; PR: 99%, strong staining;  Cribiform, micropapillary, papillary type, NG1 w  ith necrosis; without skin involvement, without nipple involvement, without muscle involvement. Closest margin: posterior ,49mm; 067mSLN/ALN, USC/VNPI: (Age score = 1) + (Path score = 1) + (Margin score = 3) + (Size score = 3) = 8, intermediate risk.            She is in good health and spirits and has no new symptoms referable to her breasts. She recently came back from a vacation in [place] does note a small mass to the center of her stomach, which has present  from for a year. It is not painful and has not changed. She has not had it evaluated. He denies any nausea, vomiting, or appetite changes.    She denies arm issues including heaviness, swelling, infections, changes in how her clothes or jewelry fit, range of motion issues.    PAST MEDICAL AND SURGICAL HISTORY, SOCIAL AND FAMILY HISTORY: These were documented on the patient intake form and reviewed with the patient and notes no significant changes since her last visit with [place].     MEDICATIONS:   Current Outpatient Medications   Medication Sig    venlafaxine  (EFFEXOR) 100 MG tablet Take 150 mg by mouth daily    ibuprofen (ADVIL,MOTRIN) 400 MG tablet Take 2 tablets (800 mg total) by mouth 3 times daily as needed for Pain    levothyroxine (SYNTHROID, LEVOTHROID) 100 MCG tablet Take 112 mcg by mouth daily (before breakfast)       fluticasone (FLONASE) 50 MCG/ACT nasal spray 1 spray by Nasal route daily as needed for Rhinitis    hydrOXYzine HCl (ATARAX) 25 MG tablet Take 25 mg by mouth 3 times daily as needed for Itching    melatonin 3 MG Take 3 mg by mouth nightly as needed for Sleep     No current facility-administered medications for this visit.       ALLERGIES: has No Known Allergies (drug, envir, food or latex).    REVIEW OF SYSTEMS: Comprehensive ROS was taken on the patient intake form and reviewed with the patient.  Pertinent items are noted in HPI.    PHYSICAL EXAMINATION:  Vitals: BP 143/84 (BP Location: Right arm, Patient Position: Sitting, Cuff Size: adult)    Pulse 102    Temp 36.4 C (97.5 F) (Temporal)    Resp 20    Ht 154.9 cm (5' 0.98")    Wt 78.9 kg (173 lb 15.1 oz)    BMI 32.88 kg/m   General appearance: alert, appears stated age and cooperative  Head: Normocephalic, without obvious abnormality, atraumatic  Eyes: conjunctivae/corneas clear. PERRL, EOM's intact.  Ears: External ears are normal, hearing is grossly intact.  Nose: Nares normal and patent.  Neck: no adenopathy and supple, symmetrical, trachea midline  Back: symmetric, no curvature. ROM normal. No CVA tenderness.  Lungs: clear to auscultation bilaterally  Heart: regular rate and rhythm, S1, S2 normal, no murmur, click, rub or gallop  Abdomen: Soft, nontender, nondistended, no organomegaly, small round mobile mass to upper central abdomen. No tenderness or overlying skin changes.   Extremities: extremities normal, atraumatic, no cyanosis or edema. There is not evidence of lymphedema. ROM is within normal limits  Pulses: 2+ and symmetric  Skin: Skin color, texture, turgor normal. No rashes  or lesions  Lymph nodes: Cervical, supraclavicular, and axillary nodes normal.  Neurologic: Grossly normal  Breasts: Comprehensive breast examination was performed in the seated and supine positions. The both breasts are surgically absent, with Immediate Post Mastectomy Tissue Expander/Implant reconstruction. The surgical scars are well healed.   The skin is without erythema, edema, nodules or other lesions. There are no skin changes on arm maneuvers. On palpation there are no masses palpable in both mastectomy site/reconstruction.     IMAGING:   No post treatment imaging to review     IMPRESSION: Clinically NED currently 1 year 1 month S/P bilateral Mastectomy and left sentinel lymph node biopsy with Immediate Post Mastectomy Tissue Expander/Implant reconstruction for Cancer Staging  Ductal carcinoma in situ (DCIS) of left breast  Staging form:  Breast, AJCC 8th Edition  - Clinical stage from 02/28/2017: Stage Unknown (cTis (DCIS), cNX, cM0, ER: Positive, PR: Positive, HER2: Not assessed ) - Signed by [name], NP on 03/08/2017  - Pathologic stage from 04/07/2017: Stage 0 (pTis (DCIS), pN0(sn), cM0, ER: Positive, PR: Positive, HER2: Not assessed ) - Signed by [name], MD on 04/07/2017    PLAN:  1) Surveillance plan: [name] continues to be seen on an every 12 month basis.  We will see her back in March 2022.  2) abdominal mass: this is most consistent with a lipoma, however I have ordered an [place] for evaluation.   3) We also discussed what is known about the impact of lifestyle issues on breast cancer risk.  It is clear that a healthy lifestyle, with maintenance of an ideal body weight, diet high in fiber and low in fat, regular exercise, and avoidance of alcohol intake are associated with lower risk of the development of breast cancer. Further, based on rather compelling epidemiologic data showing an association between low vitamin D levels and breast cancer risk, we recommend that lpatients take  2000 international unit a day.   4) She knows to remain breast aware and to contact the office in the interim with any questions or concerns as they arise.     Ernesto Rutherford, [place]

## 2019-05-06 ENCOUNTER — Other Ambulatory Visit: Payer: Self-pay | Admitting: Pulmonary Disease

## 2019-05-06 DIAGNOSIS — Z23 Encounter for immunization: Secondary | ICD-10-CM

## 2019-05-13 ENCOUNTER — Ambulatory Visit
Admission: RE | Admit: 2019-05-13 | Discharge: 2019-05-13 | Disposition: A | Discharge status: Home / Self Care | Payer: Medicare (Managed Care) | Source: Ambulatory Visit | Attending: Physician Assistant | Admitting: Physician Assistant

## 2019-05-13 ENCOUNTER — Telehealth: Payer: Self-pay | Admitting: Surgical Oncology

## 2019-05-13 ENCOUNTER — Telehealth: Payer: Self-pay | Admitting: Physician Assistant

## 2019-05-13 ENCOUNTER — Other Ambulatory Visit: Payer: Self-pay | Admitting: Physician Assistant

## 2019-05-13 DIAGNOSIS — R1909 Other intra-abdominal and pelvic swelling, mass and lump: Secondary | ICD-10-CM

## 2019-05-13 DIAGNOSIS — D0512 Intraductal carcinoma in situ of left breast: Secondary | ICD-10-CM | POA: Insufficient documentation

## 2019-05-13 NOTE — Telephone Encounter (Signed)
Spoke to [name] to let her know she's scheduled for CT abd/pelvis on 05/26/19 at 1:40pm at [facility] location, [address]

## 2019-05-13 NOTE — Telephone Encounter (Signed)
Discussed [place] result with [name]. US shows that the masses palpated on her abdomen may be areas of prominent superficial fat. Abdominal CT could be obtained for further evaluation. I discussed this result with [name]. She would feel most comfortable getting a CT to confirm diagnosis. CT ordered and scheduled

## 2019-05-13 NOTE — Telephone Encounter (Signed)
Returning call to [facility]

## 2019-05-26 ENCOUNTER — Ambulatory Visit
Admission: RE | Admit: 2019-05-26 | Discharge: 2019-05-26 | Disposition: A | Discharge status: Home / Self Care | Payer: Medicare (Managed Care) | Source: Ambulatory Visit | Attending: Physician Assistant | Admitting: Physician Assistant

## 2019-05-26 DIAGNOSIS — K4090 Unilateral inguinal hernia, without obstruction or gangrene, not specified as recurrent: Secondary | ICD-10-CM | POA: Insufficient documentation

## 2019-05-26 DIAGNOSIS — K5730 Diverticulosis of large intestine without perforation or abscess without bleeding: Secondary | ICD-10-CM | POA: Insufficient documentation

## 2019-05-26 DIAGNOSIS — D0512 Intraductal carcinoma in situ of left breast: Secondary | ICD-10-CM | POA: Insufficient documentation

## 2019-05-26 DIAGNOSIS — N200 Calculus of kidney: Secondary | ICD-10-CM | POA: Insufficient documentation

## 2019-05-26 LAB — POCT CREATININE
Creatinine, POCT: 0.7 mg/dL (ref 0.51–0.95)
GFR,Black POC: 102 *
GFR,Other POC: 89 *

## 2019-05-26 MED ORDER — IOHEXOL 350 MG/ML (OMNIPAQUE) IV SOLN *I*
1.0000 mL | Freq: Once | INTRAVENOUS | Status: AC
Start: 2019-05-26 — End: 2019-05-26
  Administered 2019-05-26: 100 mL via INTRAVENOUS

## 2019-05-26 MED ORDER — STERILE WATER FOR IRRIGATION IR SOLN *I*
900.0000 mL | Freq: Once | Status: AC
Start: 2019-05-26 — End: 2019-05-26
  Administered 2019-05-26: 900 mL via ORAL

## 2019-07-28 IMAGING — CR XRAY LUMBAR SPINE COMPLETE W/ BENDING VIEWS
1 series · 7 of 7 positions shown · non-contrast
Comparison: 12/28/2016.

EXAM:  XRAY LUMBAR SPINE COMPLETE W/ BENDING VIEWS
INDICATION: Lower back pain.

[Series 4: view not recorded · 0.17mm/px · 7 of 7 slices shown]
[im 1/7]
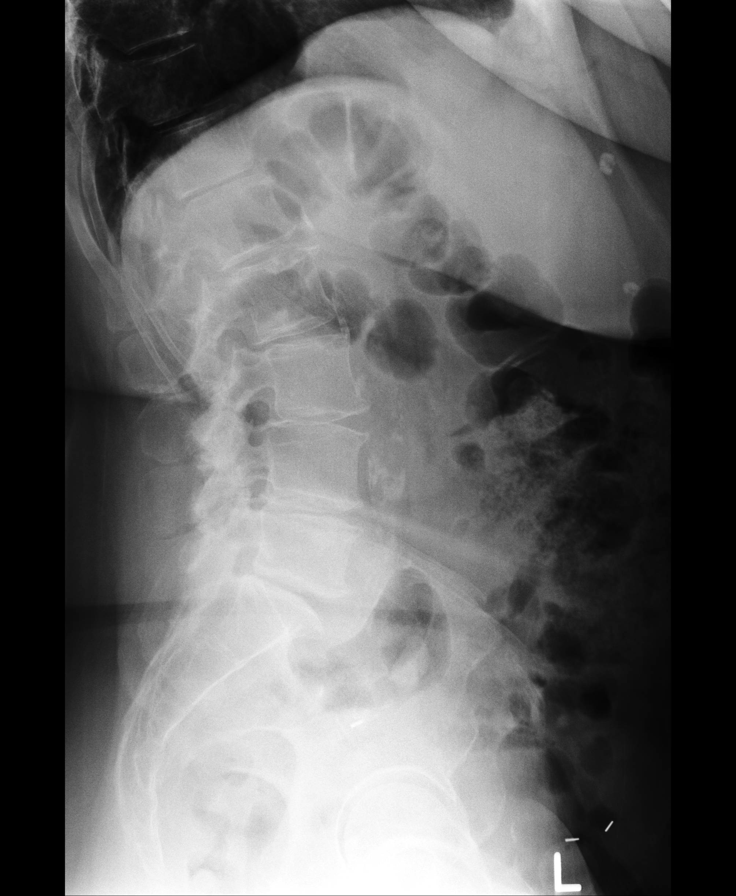
[im 2/7]
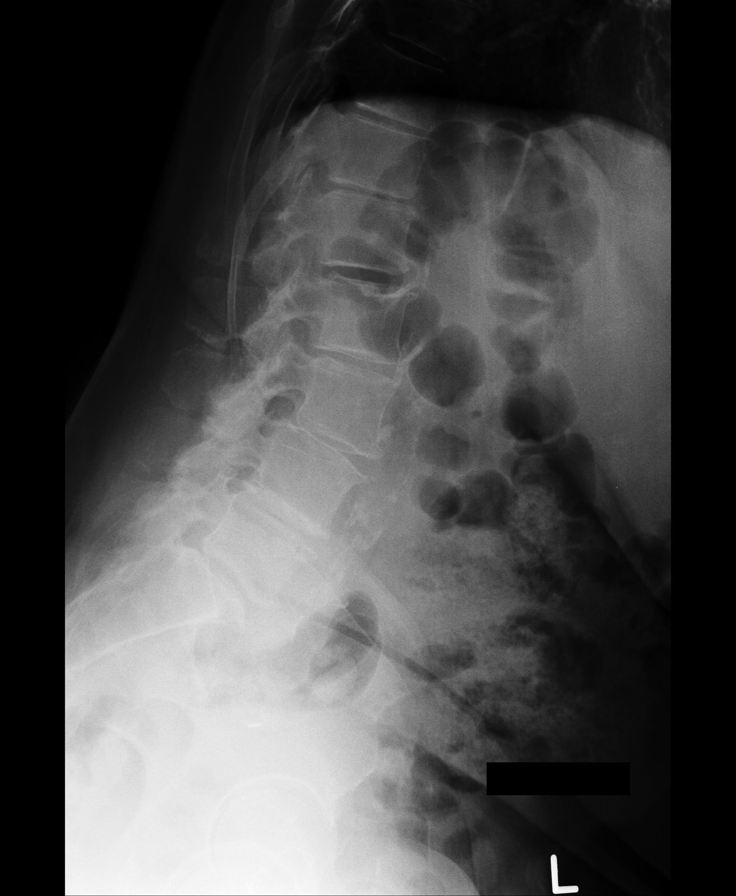
[im 3/7]
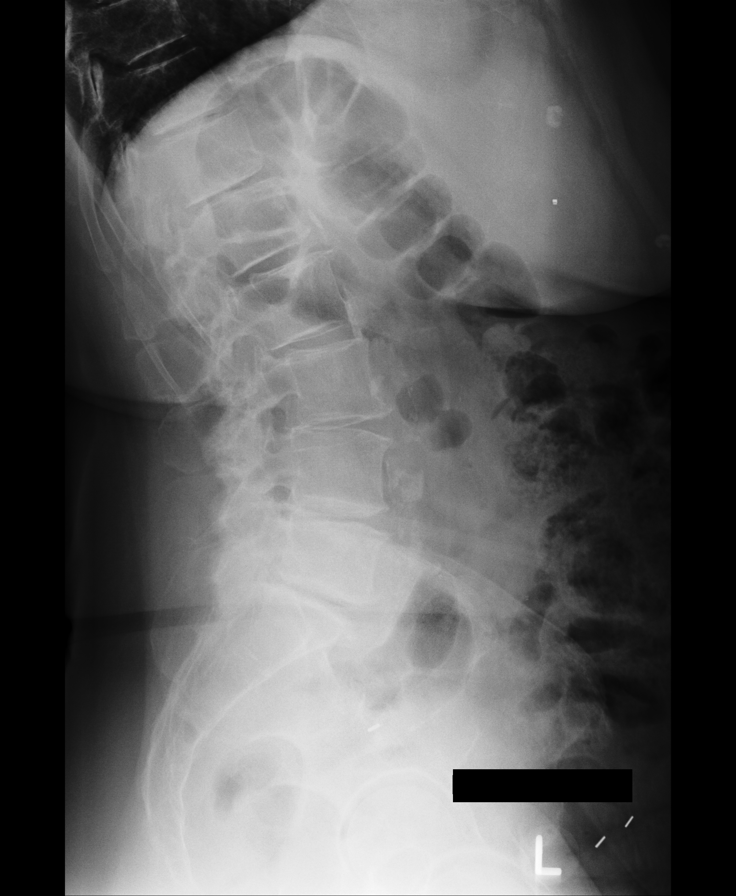
[im 4/7]
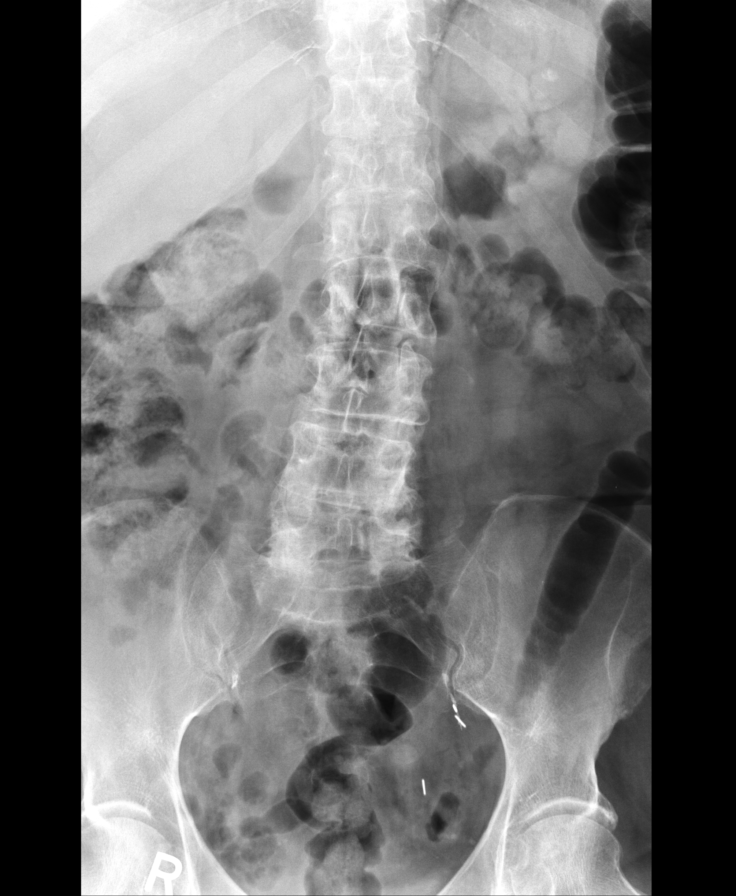
[im 5/7]
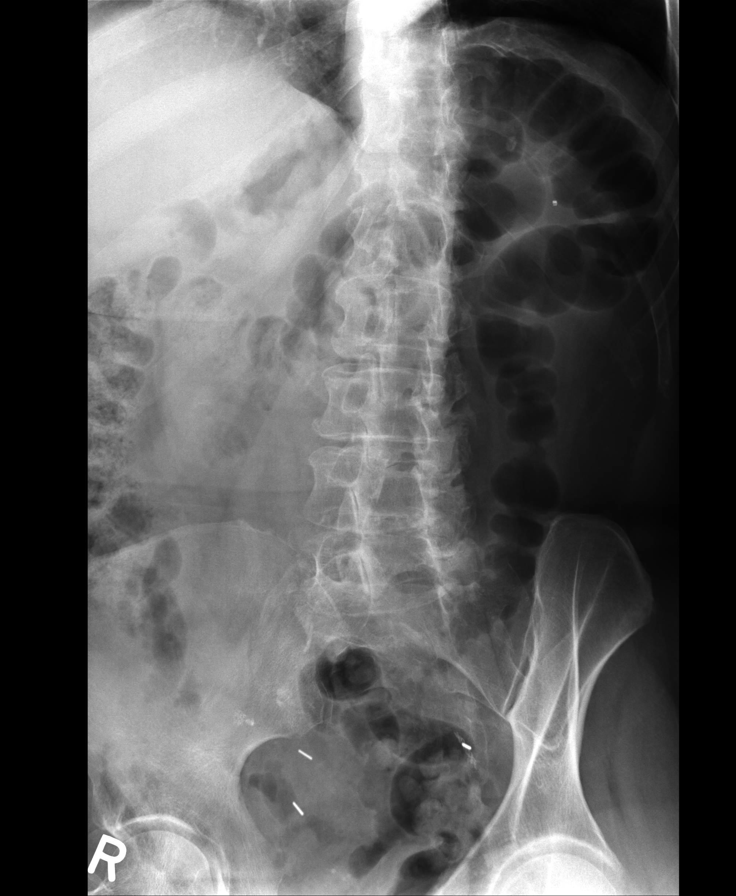
[im 6/7]
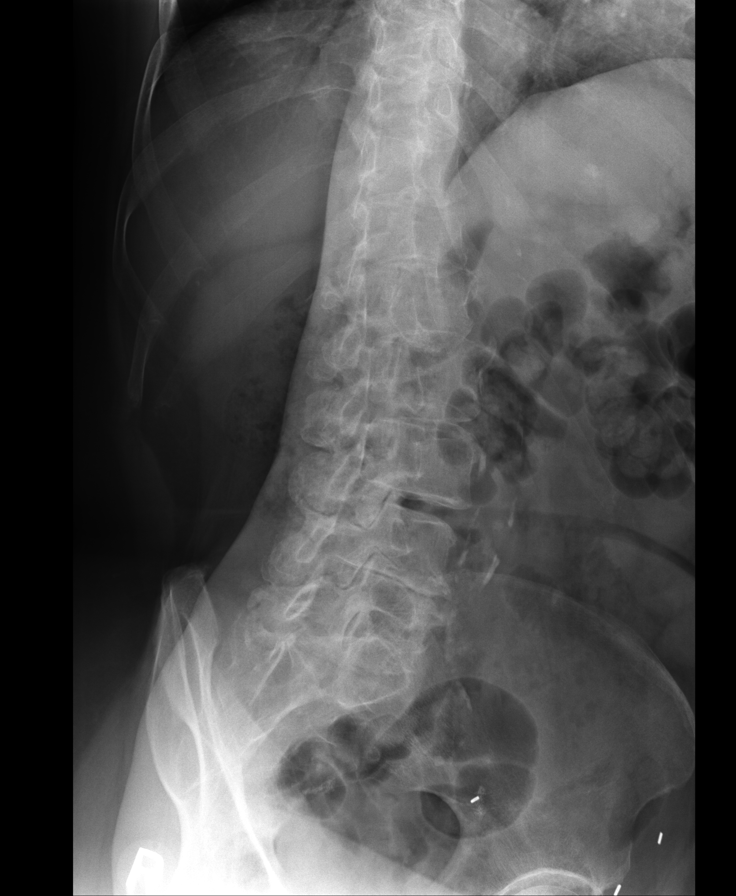
[im 7/7]
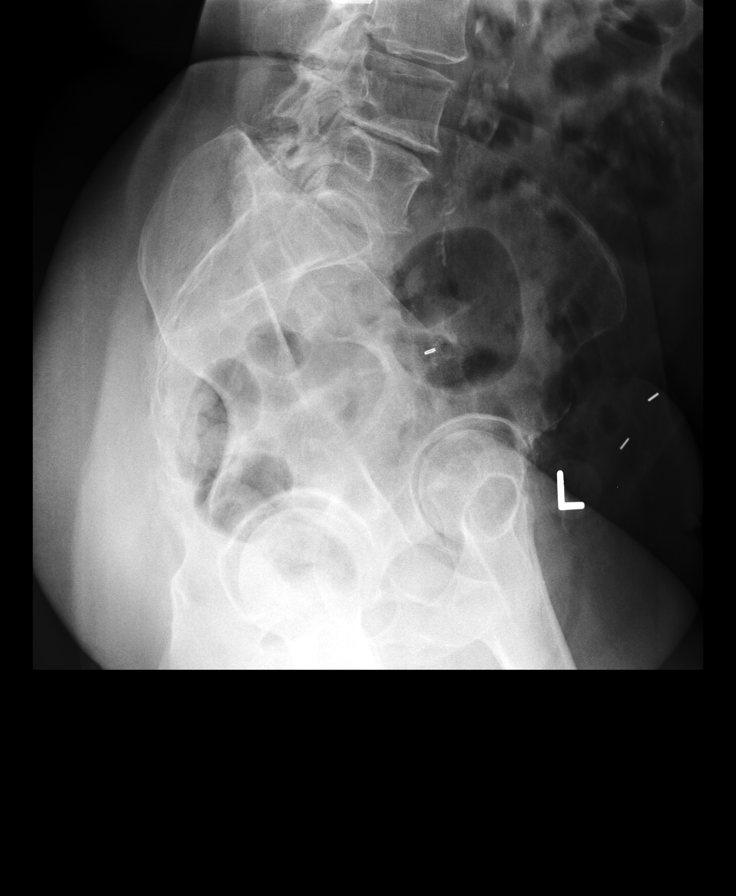

[7 of 7 positions shown; findings below may reference images not displayed]

FINDINGS: There is no acute fracture or subluxation. There is moderate degenerative disc disease at most levels. There is also extensive facet arthropathy within the lower lumbar spine. No definite abnormality is seen on the flexion and extension views. Paraspinal soft tissues are unremarkable. There are significant vascular calcifications.
IMPRESSION: Multilevel degenerative changes as detailed above.

## 2019-08-04 ENCOUNTER — Other Ambulatory Visit
Admission: RE | Admit: 2019-08-04 | Discharge: 2019-08-04 | Disposition: A | Discharge status: Home / Self Care | Payer: Medicare (Managed Care) | Source: Ambulatory Visit | Attending: Family Medicine | Admitting: Family Medicine

## 2019-08-04 DIAGNOSIS — Z0000 Encounter for general adult medical examination without abnormal findings: Secondary | ICD-10-CM | POA: Insufficient documentation

## 2019-08-04 LAB — COMPREHENSIVE METABOLIC PANEL
ALT: 27 U/L (ref 0–35)
AST: 29 U/L (ref 0–35)
Albumin: 4.6 g/dL (ref 3.5–5.2)
Alk Phos: 91 U/L (ref 35–105)
Anion Gap: 11 (ref 7–16)
Bilirubin,Total: 0.5 mg/dL (ref 0.0–1.2)
CO2: 27 mmol/L (ref 20–28)
Calcium: 9.6 mg/dL (ref 8.6–10.2)
Chloride: 102 mmol/L (ref 96–108)
Creatinine: 0.89 mg/dL (ref 0.51–0.95)
GFR,Black: 77 *
GFR,Caucasian: 66 *
Glucose: 123 mg/dL — ABNORMAL HIGH (ref 60–99)
Lab: 16 mg/dL (ref 6–20)
Potassium: 4.4 mmol/L (ref 3.3–5.1)
Sodium: 140 mmol/L (ref 133–145)
Total Protein: 6.9 g/dL (ref 6.3–7.7)

## 2019-08-04 LAB — LIPID PANEL
Chol/HDL Ratio: 2.6
Cholesterol: 235 mg/dL — AB
HDL: 92 mg/dL — ABNORMAL HIGH (ref 40–60)
LDL Calculated: 117 mg/dL
Non HDL Cholesterol: 143 mg/dL
Triglycerides: 131 mg/dL

## 2019-08-04 LAB — URINALYSIS WITH MICROSCOPIC
Blood,UA: NEGATIVE
Glucose,UA: NEGATIVE mg/dL
Ketones, UA: NEGATIVE
Nitrite,UA: NEGATIVE
Protein,UA: NEGATIVE mg/dL
Specific Gravity,UA: 1.008 (ref 1.002–1.030)
pH,UA: 6.5 (ref 5.0–8.0)

## 2019-08-04 LAB — CBC
Hematocrit: 47 % — ABNORMAL HIGH (ref 34–45)
Hemoglobin: 15 g/dL (ref 11.2–15.7)
MCH: 31 pg (ref 26–32)
MCHC: 32 g/dL (ref 32–36)
MCV: 98 fL — ABNORMAL HIGH (ref 79–95)
Platelets: 361 10*3/uL (ref 160–370)
RBC: 4.8 MIL/uL (ref 3.9–5.2)
RDW: 13.5 % (ref 11.7–14.4)
WBC: 7.1 10*3/uL (ref 4.0–10.0)

## 2019-08-04 LAB — T4, FREE: Free T4: 1.3 ng/dL (ref 0.9–1.7)

## 2019-08-04 LAB — TSH: TSH: 5.75 u[IU]/mL — ABNORMAL HIGH (ref 0.27–4.20)

## 2019-12-02 ENCOUNTER — Other Ambulatory Visit: Payer: Self-pay | Admitting: Gastroenterology

## 2019-12-02 NOTE — Procedures (Signed)
[facility]    Colonoscopy    Date of Procedure: 12/02/2019   Primary Physician: Mickey Farber, MD        Attending Physician: Joneen Caraway. Jone Baseman, M.D.  Patient Name: [name]      Indications: Colorectal cancer screening-average risk    Previous colonoscopy: Yes -- Date: 2014    Medications:Fentanyl 75 mcg IV and Midazolam 5 mg IV were administered incrementally over the course of the procedure to achieve an adequate level of conscious sedation.    Pre-Procedure Physical:  Patient's medications, allergies, past medical, surgical, social and family histories were reviewed and updated as appropriate.    BP 149/93, P 86   Airway:  normal  Heart:  normal S1 and S2  Lungs:  clear  Abdomen:  soft, nontender, normal bowel sounds  Mental Status:  awake and alert; oriented to person, place, and time     ASA Classification:  1    Informed Consent: Prior to the procedure, the patient was explained the risk, benefits and alternatives including the risk of bleeding, infection or perforation and informed consent was obtained.     Procedure Details: The patient was placed in the left lateral decubitus position and monitored continuously with ECG tracing, pulse oximetry, blood pressure monitoring and direct observations. After anorectal examination was performed, the Olympus PCF-H180was inserted into the rectum and advanced under direct vision to the terminal ileum. The procedure was considered not difficult..  Cecum withdrawal time:  6 minutes.    Narrative:  During withdrawal examination, the final quality of the prep was [facility] Bowel Prep Scale   Prep:    Right Colon: Grade3- (entire mucosa of colon segment seen well, with no residual staining, small fragments of stool, or opaque liguid)    Transverse Colon: Grade 3- (entire mucosa of colon segment seen well, with no residual staining, small fragments of stool, or opaque liguid)    Left Colon: Grade 3- (entire mucosa of colon  segment seen well, with no residual staining, small fragments of stool, or opaque liguid)    A careful inspection was made as the colonoscope was withdrawn, a retroflexed view of the rectum was included; findings and interventions are described below. Appropriate photo documentation was obtained. The patient  recovered in the [place] recovery unit.    Findings:     Terminal Ileum: Normal.    Colon: There was mild sigmoid colon diverticulosis.  There were no appreciable colon polyps, lesions or masses.  Small internal hemorrhoids seen on retroflexion.    Complications: The patient tolerated the procedure well.    Impression:    There was mild sigmoid colon diverticulosis.    There were no appreciable colon polyps, lesions or masses.    Small internal hemorrhoids seen on retroflexion.    Recommendations:  1. Suggest high fiber diet.  2. Colonoscopy is recommended in 10 years for colorectal cancer screening.    Electronically signed: Tonette Lederer, MD    Note created: 12/02/2019  Office Phone # [phone]

## 2019-12-25 IMAGING — MG 3D SCREENING MAMMO BIL W/CAD & TOMO
5 series · 7 of 24 positions shown · non-contrast
Comparison: Mammograms dated 11/07/2019, 09/21/2018.

------------- REPORT [id number] -------------
[facility]
[address]
We wish to report the following on your recent mammography examination. We are sending a report to your referring physician or other health care provider. 
(       Normal/Negative:
No evidence of cancer.
This statement is mandated by the [place], Department of Health.
Your examination was performed by one of our technologists, who are registered radiological technologists and also specially certified in mammography:
___
Frank, Zarina (M)
Sell, Pepper (M)

Your mammogram was interpreted by our radiologist.
( 
Jean Rousso Aniece, M.D.
(Annual Breast Examination by a physician or other health care provider
(Annual Mammography Screening beginning at age 40
(Monthly Breast Self Examination
------------- REPORT [id number] -------------
﻿
                         #:
EXAM:  BILATERAL DIGITAL SCREENING MAMMOGRAM WITH 3D TOMOSYNTHESIS AND CAD
INDICATION: Asymptomatic 69-year-old with no family history of breast cancer in first-degree relatives.  History of breast reduction surgery on both sides.
TECHNIQUE: 2D mammogram in CC projection both breasts. 3D Tomosynthesis of both breasts in the MLO projection. Virtual 2D images were obtained from the 3D images in the MLO projection.

[R]
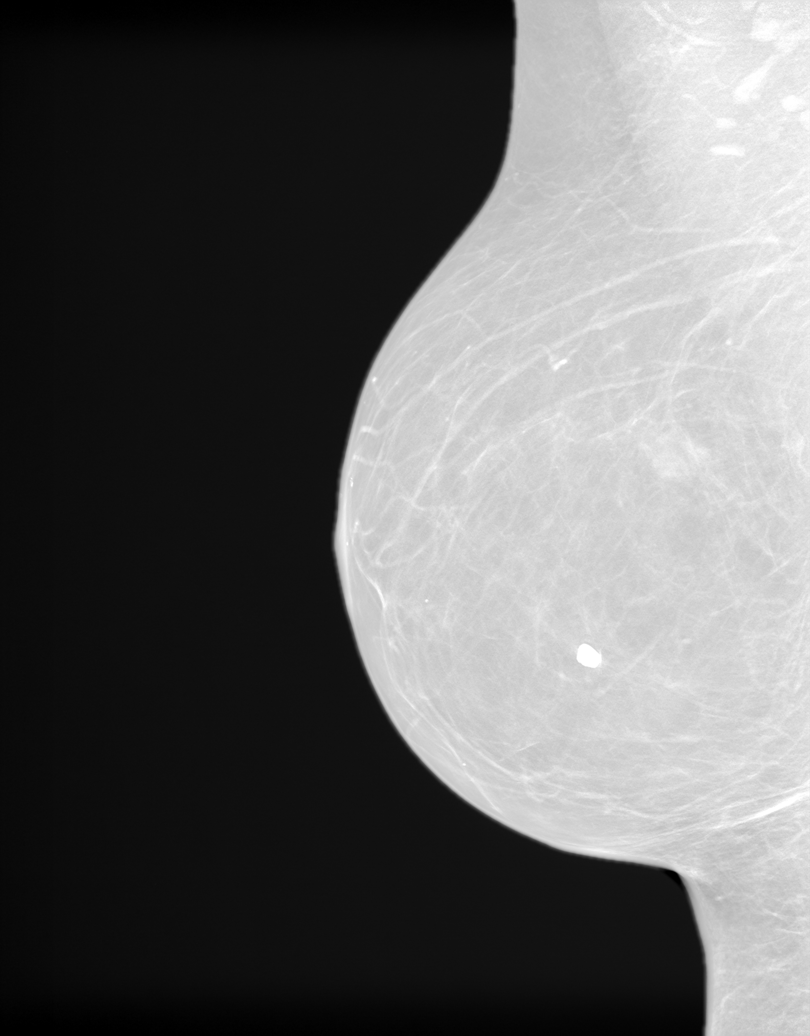

[L]
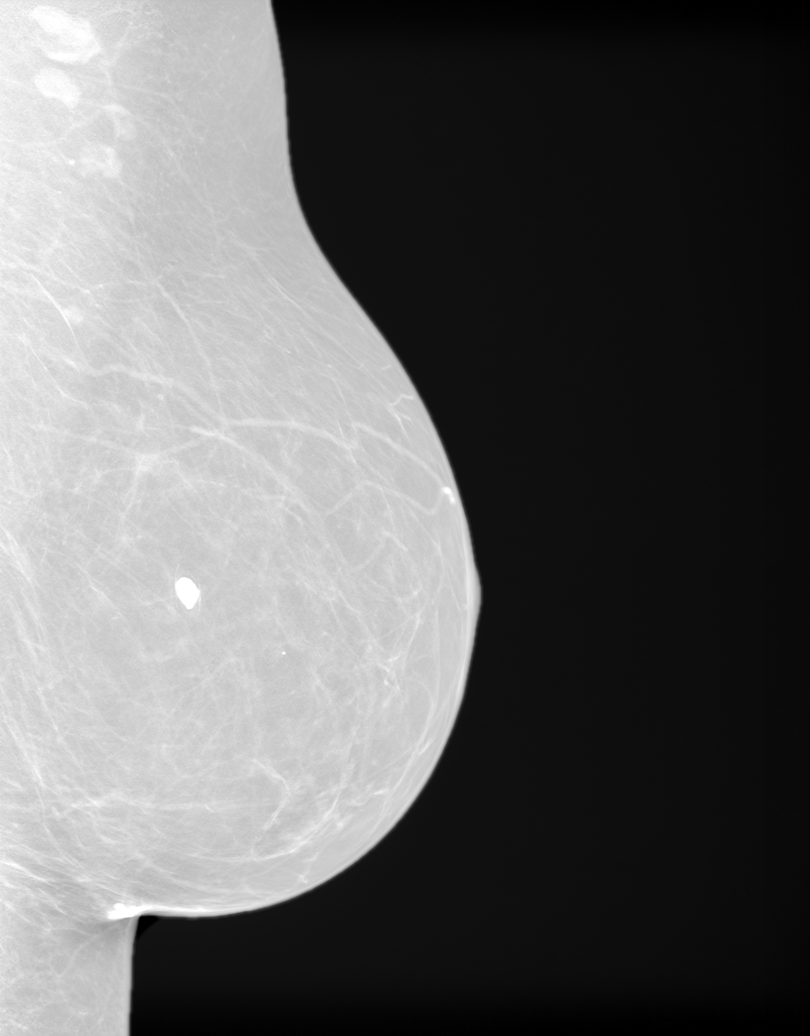

[Series 481: R CC tomo · right · 2 of 2 slices shown]
[im 1/2]
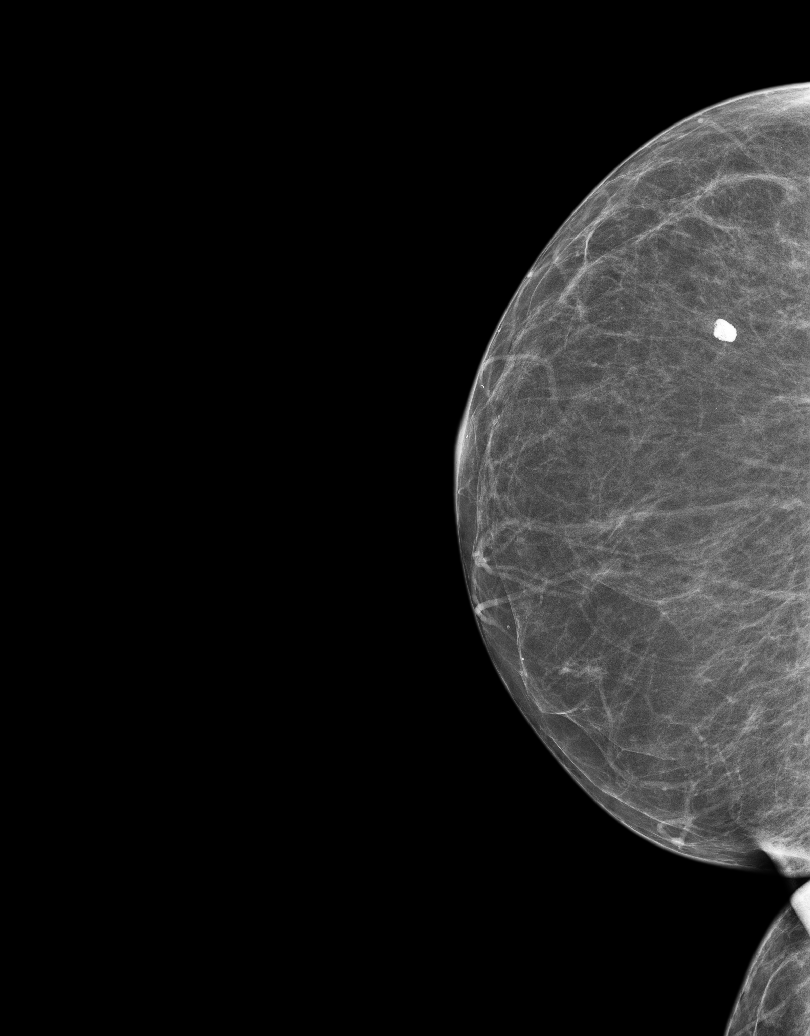
[im 2/2]
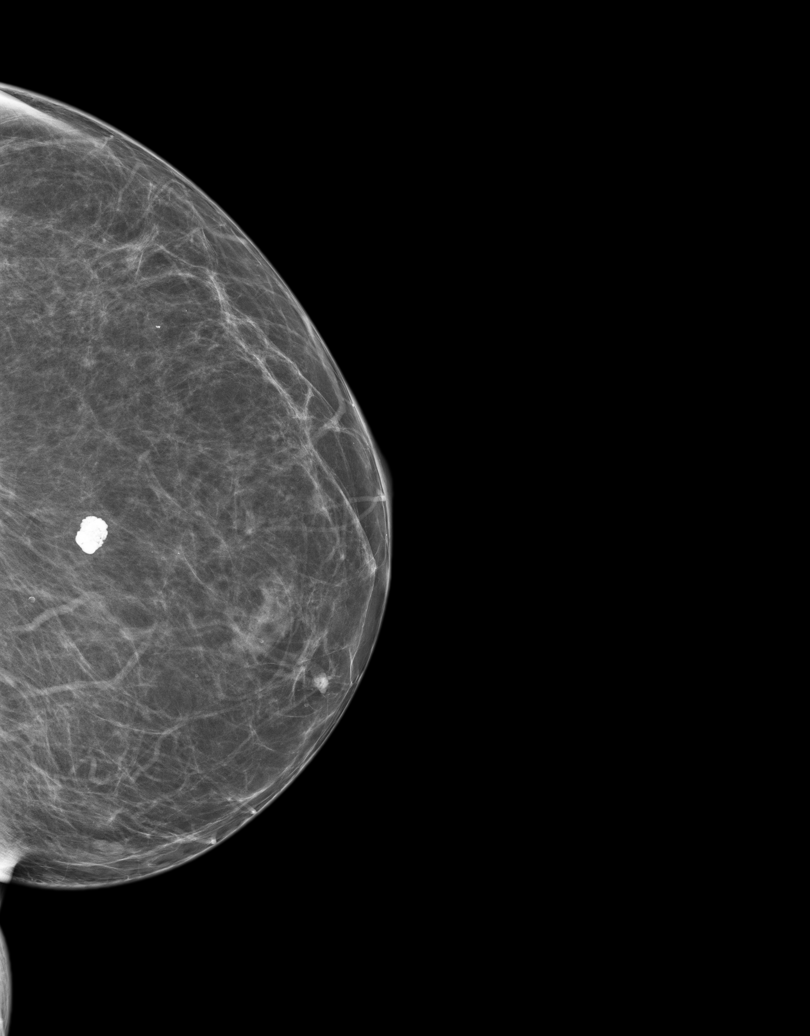

[Series 483: 3D SCREENING MAMMO BIL W/CAD & TOMO tomo · 2 acquisitions, 2 frames shown (1 of 2)]
[im 1/2]
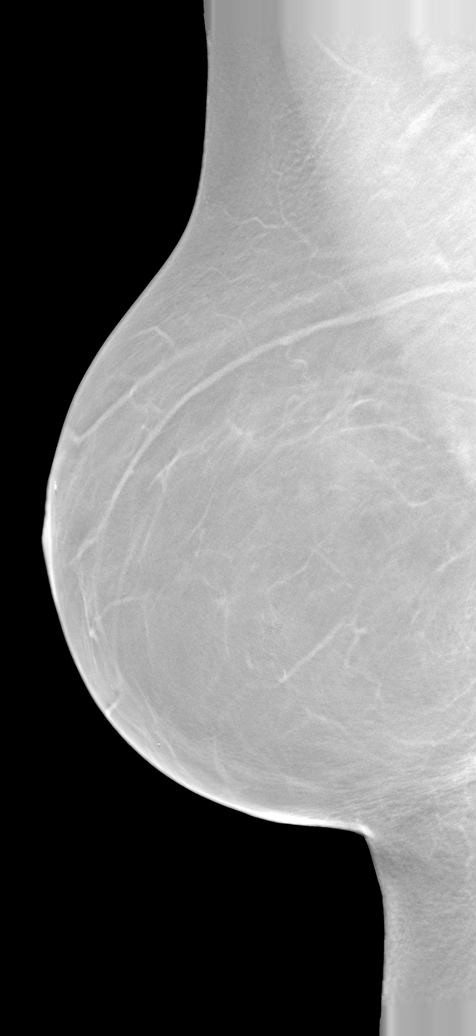
[im 2/2]
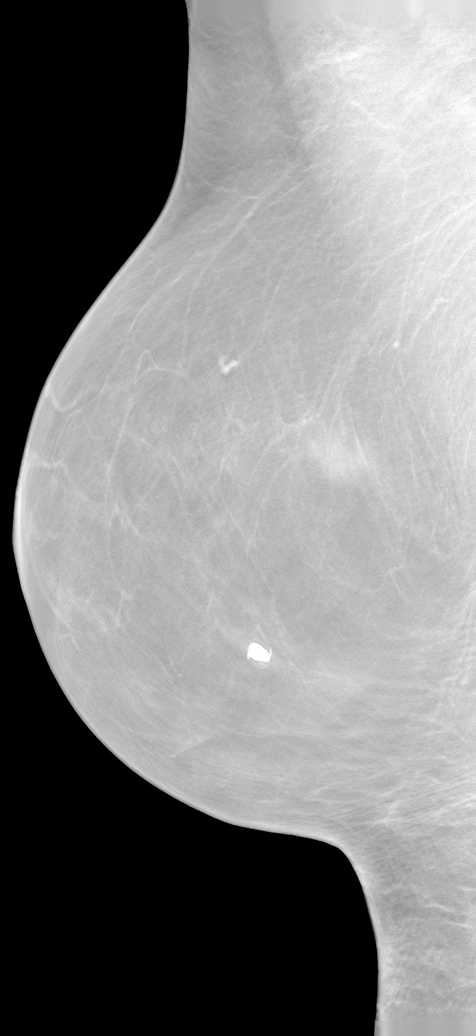

[3D SCREENING MAMMO BIL W/CAD & TOMO tomo (2 of 2) · tomo slice 11/70.0]
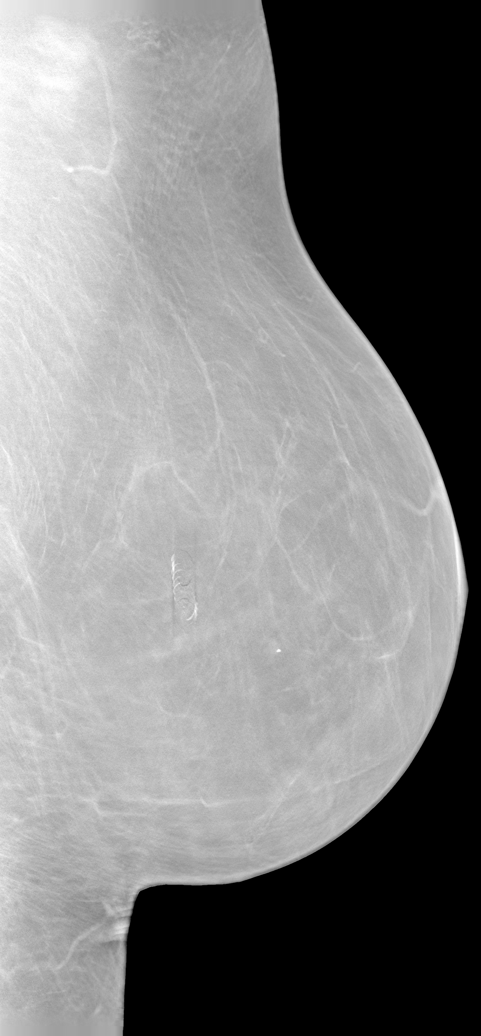

[7 of 24 positions shown; findings below may reference images not displayed]

FINDINGS: No focal mass or architectural changes are noted.  Benign microcalcific foci in both breasts and well-defined soft tissue nodule in the posterior right breast on MLO projection are stable in appearance.  No skin changes or nipple changes are seen.  Benign lymph nodes in the axilla are stable.
IMPRESSION: 1.  Stable mammographic findings.  Clinical and mammographic followup at 12 months. 

2.  BIRADS 2-Benign findings. Patient has been added in a reminder system with a target date for the next screening mammography.

3.  DENSITY CODE – A (Almost entirely fatty). 

Final Assessment Code:

Bi-Rads 2 

BI-RADS 0
Need additional imaging evaluation.

BI-RADS 1
Negative mammogram.

BI-RADS 2
Benign finding.

BI-RADS 3
Probably benign finding; short-interval follow-up suggested.

BI-RADS 4
Suspicious abnormality; biopsy should be considered.

BI-RADS 5
Highly suggestive of malignancy; appropriate action should be taken.

BI-RADS 6
Known biopsy-proven malignancy; appropriate action should be taken.

NOTE:
In compliance with Federal regulations, the results of this mammogram are being sent to the patient.

## 2020-01-04 ENCOUNTER — Encounter: Payer: Self-pay | Admitting: Surgery

## 2020-01-18 ENCOUNTER — Ambulatory Visit: Payer: Medicare (Managed Care) | Admitting: Surgery

## 2020-01-24 ENCOUNTER — Encounter: Payer: Self-pay | Admitting: Surgery

## 2020-01-25 ENCOUNTER — Ambulatory Visit: Payer: Medicare (Managed Care) | Admitting: Surgery

## 2020-02-08 ENCOUNTER — Other Ambulatory Visit: Payer: Self-pay | Admitting: Gastroenterology

## 2020-02-15 ENCOUNTER — Ambulatory Visit: Payer: Medicare (Managed Care) | Admitting: Surgery

## 2020-02-15 DIAGNOSIS — Z9889 Other specified postprocedural states: Secondary | ICD-10-CM

## 2020-02-15 NOTE — Progress Notes (Signed)
Progress Note       History of Present Illness    Today I had the pleasure of seeing [name] who underwent revision of her bilateral breast reconstructions with silicone implant (Allergan SCM-600) on 07/25/17 and returns today for a routine follow-up visit. She is doing well.  Unhappy with contour of the left implant but does not want to pursue any surgery.     Physical Exam  Vital signs: There were no vitals taken for this visit.  General appearance: Alert, well appearing, and in no distress.   Skin: Skin color, texture and turgor are overall normal for age.  Focused detailed exam of bilateral breasts: Well healed incisions to bilateral breast reconstructions.  Implants in excellent position with excellent symmetry.  No capsular contracture.  No masses or cutaneous lesions.      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     (B) Skin, left breast, excision:   - Dermal scar and foreign body giant cell reaction.     Received from [facility], [place],   [place], [place] [address]; reviewed and completed at [facility], [address] [place], [place], [place].       INDICATIONS FOR PROCEDURE/CLINICAL HISTORY:   S/P breast reconstruction, bilateral [Z98.890]            Assessment and Recommendations    [name] is a 70 y.o. female here for a routine follow-up visit s/p silicone implant exchange.  She is doing well.  Implants are soft and healthy. We discussed recommendations for implant surveillance have changed to and [place] first 5-6 years after implant placement. She will follow up in 1 year. She was instructed to contact the office prior to her next appointment with any problems, questions, or concerns.      Reginia Naas, NP  Plastic Surgery

## 2020-03-07 ENCOUNTER — Other Ambulatory Visit
Admission: RE | Admit: 2020-03-07 | Discharge: 2020-03-07 | Disposition: A | Discharge status: Home / Self Care | Payer: Medicare (Managed Care) | Source: Ambulatory Visit

## 2020-03-07 DIAGNOSIS — E039 Hypothyroidism, unspecified: Secondary | ICD-10-CM | POA: Insufficient documentation

## 2020-03-07 LAB — CBC AND DIFFERENTIAL
Baso # K/uL: 0 10*3/uL (ref 0.0–0.1)
Basophil %: 0.7 %
Eos # K/uL: 0.1 10*3/uL (ref 0.0–0.4)
Eosinophil %: 1.3 %
Hematocrit: 44 % (ref 34–45)
Hemoglobin: 14.5 g/dL (ref 11.2–15.7)
IMM Granulocytes #: 0 10*3/uL (ref 0.0–0.0)
IMM Granulocytes: 0.2 %
Lymph # K/uL: 1.4 10*3/uL (ref 1.2–3.7)
Lymphocyte %: 25.7 %
MCH: 32 pg (ref 26–32)
MCHC: 33 g/dL (ref 32–36)
MCV: 97 fL — ABNORMAL HIGH (ref 79–95)
Mono # K/uL: 0.6 10*3/uL (ref 0.2–0.9)
Monocyte %: 10.4 %
Neut # K/uL: 3.4 10*3/uL (ref 1.6–6.1)
Nucl RBC # K/uL: 0 10*3/uL (ref 0.0–0.0)
Nucl RBC %: 0 /100 WBC (ref 0.0–0.2)
Platelets: 314 10*3/uL (ref 160–370)
RBC: 4.5 MIL/uL (ref 3.9–5.2)
RDW: 12.9 % (ref 11.7–14.4)
Seg Neut %: 61.7 %
WBC: 5.6 10*3/uL (ref 4.0–10.0)

## 2020-03-07 LAB — COMPREHENSIVE METABOLIC PANEL
ALT: 30 U/L (ref 0–35)
AST: 28 U/L (ref 0–35)
Albumin: 4.4 g/dL (ref 3.5–5.2)
Alk Phos: 81 U/L (ref 35–105)
Anion Gap: 11 (ref 7–16)
Bilirubin,Total: 0.5 mg/dL (ref 0.0–1.2)
CO2: 26 mmol/L (ref 20–28)
Calcium: 9.7 mg/dL (ref 8.6–10.2)
Chloride: 103 mmol/L (ref 96–108)
Creatinine: 0.74 mg/dL (ref 0.51–0.95)
GFR,Black: 95 *
GFR,Caucasian: 83 *
Glucose: 101 mg/dL — ABNORMAL HIGH (ref 60–99)
Lab: 16 mg/dL (ref 6–20)
Potassium: 4.2 mmol/L (ref 3.3–5.1)
Sodium: 140 mmol/L (ref 133–145)
Total Protein: 7 g/dL (ref 6.3–7.7)

## 2020-03-07 LAB — LIPID PANEL
Chol/HDL Ratio: 2.5
Cholesterol: 251 mg/dL — AB
HDL: 99 mg/dL — ABNORMAL HIGH (ref 40–60)
LDL Calculated: 135 mg/dL — AB
Non HDL Cholesterol: 152 mg/dL
Triglycerides: 87 mg/dL

## 2020-03-07 LAB — TSH: TSH: 5.6 u[IU]/mL — ABNORMAL HIGH (ref 0.27–4.20)

## 2020-03-07 LAB — T4, FREE: Free T4: 1.2 ng/dL (ref 0.9–1.7)

## 2020-04-18 ENCOUNTER — Other Ambulatory Visit: Payer: Self-pay | Admitting: Gastroenterology

## 2020-05-03 ENCOUNTER — Other Ambulatory Visit
Admission: RE | Admit: 2020-05-03 | Discharge: 2020-05-03 | Disposition: A | Discharge status: Home / Self Care | Payer: Medicare (Managed Care) | Source: Ambulatory Visit

## 2020-05-03 DIAGNOSIS — E039 Hypothyroidism, unspecified: Secondary | ICD-10-CM | POA: Insufficient documentation

## 2020-05-03 LAB — TSH: TSH: 2.25 u[IU]/mL (ref 0.27–4.20)

## 2020-05-03 LAB — T4, FREE: Free T4: 1.5 ng/dL (ref 0.9–1.7)

## 2020-05-06 ENCOUNTER — Encounter: Payer: Self-pay | Admitting: Physician Assistant

## 2020-05-06 ENCOUNTER — Ambulatory Visit: Payer: Medicare (Managed Care) | Admitting: Physician Assistant

## 2020-05-13 ENCOUNTER — Ambulatory Visit: Payer: Medicare (Managed Care) | Admitting: Physician Assistant

## 2020-05-25 ENCOUNTER — Encounter: Payer: Self-pay | Admitting: Surgery

## 2020-05-25 ENCOUNTER — Ambulatory Visit: Payer: Medicare (Managed Care) | Attending: Surgery | Admitting: Surgery

## 2020-05-25 VITALS — BP 137/84 | HR 91 | Temp 97.9°F | Wt 178.1 lb

## 2020-05-25 DIAGNOSIS — Z86000 Personal history of in-situ neoplasm of breast: Secondary | ICD-10-CM | POA: Insufficient documentation

## 2020-05-25 DIAGNOSIS — D0512 Intraductal carcinoma in situ of left breast: Secondary | ICD-10-CM | POA: Insufficient documentation

## 2020-05-25 DIAGNOSIS — Z08 Encounter for follow-up examination after completed treatment for malignant neoplasm: Secondary | ICD-10-CM | POA: Insufficient documentation

## 2020-05-25 NOTE — Patient Instructions (Signed)
Vitamin D3 2000 international unit supplement daily

## 2020-05-25 NOTE — Progress Notes (Signed)
[facility] [place]    REFERRING PHYSICIAN: Mickey Farber, MD    HPI: [name] is a lovely 70 y.o. woman seen on 05/25/2020  at [place] at [facility] for routine follow-up. She is currently 3 years 2 months S/P bilateral Mastectomy and left sentinel lymph node biopsy with Immediate Post Mastectomy Tissue Expander/Implant reconstruction for Cancer Staging  Ductal carcinoma in situ (DCIS) of left breast  Staging form: Breast, AJCC 8th Edition  - Clinical stage from 02/28/2017: Stage Unknown (cTis (DCIS), cNX, cM0, ER: Positive, PR: Positive, HER2: Not assessed ) - Signed by [name], NP on 03/08/2017  - Pathologic stage from 04/07/2017: Stage 0 (pTis (DCIS), pN0(sn), cM0, ER: Positive, PR: Positive, HER2: Not assessed ) - Signed by [name], MD on 04/07/2017  . Her oncologic history is as follows:    Oncology History   Ductal carcinoma in situ (DCIS) of left breast   02/28/2017 Surgery    Left excisional breast biopsy for atypia (Dr. Freda Munro)    Cancer Staging  Ductal carcinoma in situ (DCIS) of left breast  Staging form: Breast, AJCC 8th Edition  - Clinical stage from 02/28/2017: Stage Unknown (cTis (DCIS), cNX, cM0, ER: Positive, PR: Positive, HER2: Not assessed ) - Signed by [name], NP on 03/08/2017  : Tumor size: 41m.  ER: 99%, strong staining; PR: 99%, strong staining;  cribiform and micropapillary type, NG1 with focal, punctate necrosis; closest margin: posterior (<135m, lateral (41m44mand medial (<41mm53m    02/28/2017 Initial Diagnosis    Ductal carcinoma in situ (DCIS) of left breast     02/28/2017 Significant Lab Findings    Cancer Staging  Ductal carcinoma in situ (DCIS) of left breast  Staging form: Breast, AJCC 8th Edition  - Clinical stage from 02/28/2017: Stage Unknown (cTis (DCIS), cNX, cM0, ER: Positive, PR: Positive, HER2: Not assessed ) - Signed by [name] on 03/08/2017  : ER: 99%, strong  staining; PR: 99%, strong staining;   In situ component: 100%, cribiform type, NG1 with necrosis, 6cm. Closest margin: posterior <41mm.16m   03/28/2017 Surgery    Bilateral total mastectomy, left sentinel node biopsy, immediate tissue expander reconstruction- Ann OMinus LibertyHowarMarland Mcalpine 03/28/2017 Significant Lab Findings    Cancer Staging  Ductal carcinoma in situ (DCIS) of left breast  Staging form: Breast, AJCC 8th Edition  - Clinical stage from 02/28/2017: Stage Unknown (cTis (DCIS), cNX, cM0, ER: Positive, PR: Positive, HER2: Not assessed ) - Signed by RegisOralia Manison 03/08/2017  - Pathologic stage from 04/07/2017: Stage 0 (pTis (DCIS), pN0(sn), cM0, ER: Positive, PR: Positive, HER2: Not assessed ) - Signed by [name] 04/07/2017  : Tumor size: 90mm.141m: 99%, strong staining; PR: 99%, strong staining;  Cribiform, micropapillary, papillary type, NG1 w  ith necrosis; without skin involvement, without nipple involvement, without muscle involvement. Closest margin: posterior ,41mm; 076mSLN/ALN, USC/VNPI: (Age score = 1) + (Path score = 1) + (Margin score = 3) + (Size score = 3) = 8, intermediate risk.            She is in good health and spirits and has no new symptoms referable to her breasts.   Doing well in general. [place]. She got sunburned after spending  [place] time in the [place].    Several of her providers have left. She's between PCPs but working with a PCP now who is "filling in." This provider has changed some of her antidepressants. She also notes some baseline, intermittent bladder incontinence.    She denies arm issues including heaviness, swelling, infections, changes in how her clothes or jewelry fit, range of motion issues.    PAST MEDICAL AND SURGICAL HISTORY, SOCIAL AND FAMILY HISTORY: These were documented on the patient intake form and reviewed with the patient and notes no significant changes  since her last visit with [place].     MEDICATIONS:   Current Outpatient Medications   Medication Sig    fluticasone (FLONASE) 50 MCG/ACT nasal spray 1 spray by Nasal route daily as needed for Rhinitis    venlafaxine (EFFEXOR) 100 MG tablet Take 150 mg by mouth daily    hydrOXYzine HCl (ATARAX) 25 MG tablet Take 25 mg by mouth 3 times daily as needed for Itching    ibuprofen (ADVIL,MOTRIN) 400 MG tablet Take 2 tablets (800 mg total) by mouth 3 times daily as needed for Pain    melatonin 3 MG Take 3 mg by mouth nightly as needed for Sleep    levothyroxine (SYNTHROID, LEVOTHROID) 100 MCG tablet Take 112 mcg by mouth daily (before breakfast)        No current facility-administered medications for this visit.       ALLERGIES: has No Known Allergies (drug, envir, food or latex).    REVIEW OF SYSTEMS: Comprehensive ROS was taken on the patient intake form and reviewed with the patient.  Pertinent items are noted in HPI.    PHYSICAL EXAMINATION:  Vitals: BP 137/84    Pulse 91    Temp 36.6 C (97.9 F) (Temporal)    Wt 80.8 kg (178 lb 2.1 oz)    SpO2 97%    BMI 33.68 kg/m   General appearance: alert, appears stated age and cooperative  Head: Normocephalic, without obvious abnormality, atraumatic, wearing mask throughout visit.   Eyes: conjunctivae/corneas clear. PERRL, EOM's intact.  Ears: External ears are normal, hearing is grossly intact.  Neck: no adenopathy and supple, symmetrical, trachea midline  Back: symmetric, no curvature. ROM normal. No CVA tenderness.  Lungs: clear to auscultation bilaterally  Heart: regular rate and rhythm, S1, S2 normal, no murmur, click, rub or gallop  Abdomen: Soft, nontender, nondistended, no organomegaly, small round mobile mass to upper central abdomen. No tenderness or overlying skin changes.   Extremities: extremities normal, atraumatic, no cyanosis or edema. There is not evidence of lymphedema. ROM is within normal limits  Pulses: 2+ and symmetric  Skin: Skin color, texture, turgor  normal. No rashes or lesions, scattered keratoses   Lymph nodes: Cervical, supraclavicular, and axillary nodes normal.  Neurologic: Grossly normal  Breasts: Comprehensive breast examination was performed in the seated and supine positions. The both breasts are surgically absent, with Immediate Post Mastectomy Tissue Expander/Implant reconstruction. The surgical scars are well healed.   The skin is without erythema, edema, nodules or other lesions. There are no skin changes on arm maneuvers. On palpation there are no masses palpable in both mastectomy site/reconstruction.       IMPRESSION: Clinically NED currently 3 years 2 months S/P bilateral Mastectomy and left sentinel lymph node biopsy with Immediate Post Mastectomy Tissue Expander/Implant reconstruction for Cancer Staging  Ductal carcinoma in situ (DCIS) of left breast  Staging form: Breast, AJCC 8th Edition  - Clinical stage from  02/28/2017: Stage Unknown (cTis (DCIS), cNX, cM0, ER: Positive, PR: Positive, HER2: Not assessed ) - Signed by [name], NP on 03/08/2017  - Pathologic stage from 04/07/2017: Stage 0 (pTis (DCIS), pN0(sn), cM0, ER: Positive, PR: Positive, HER2: Not assessed ) - Signed by [name], MD on 04/07/2017    PLAN:  1) Surveillance plan: Londynn continues to be seen on an annual.  We will see her back in 1 year. She no longer requires screening breast imaging as she is s/p bilateral mastectomy.   2) We also discussed what is known about the impact of lifestyle issues on breast cancer risk.  It is clear that a healthy lifestyle, with maintenance of an ideal body weight, diet high in fiber and low in fat, regular exercise, and avoidance of alcohol intake are associated with lower risk of the development of breast cancer. Further, based on rather compelling epidemiologic data showing an association between low vitamin D levels and breast cancer risk, we recommend that patients take 2000 international unit a day.       She knows to  remain breast aware and to contact the office in the interim with any questions or concerns as they arise.     [name], NP

## 2020-07-18 IMAGING — CR XRAY HIP W/PELVIS UNIL 2-3 VIEWS
1 series · 2 of 2 positions shown · non-contrast
Comparison: None previous.

﻿EXAM:  13356      XRAY HIP W/PELVIS UNIL 2-3 VIEWS LEFT
INDICATION: 69-year-old female with persistent left hip pain.

[Series 1: view not recorded · 0.17mm/px · 2 of 2 slices shown]
[im 1/2]
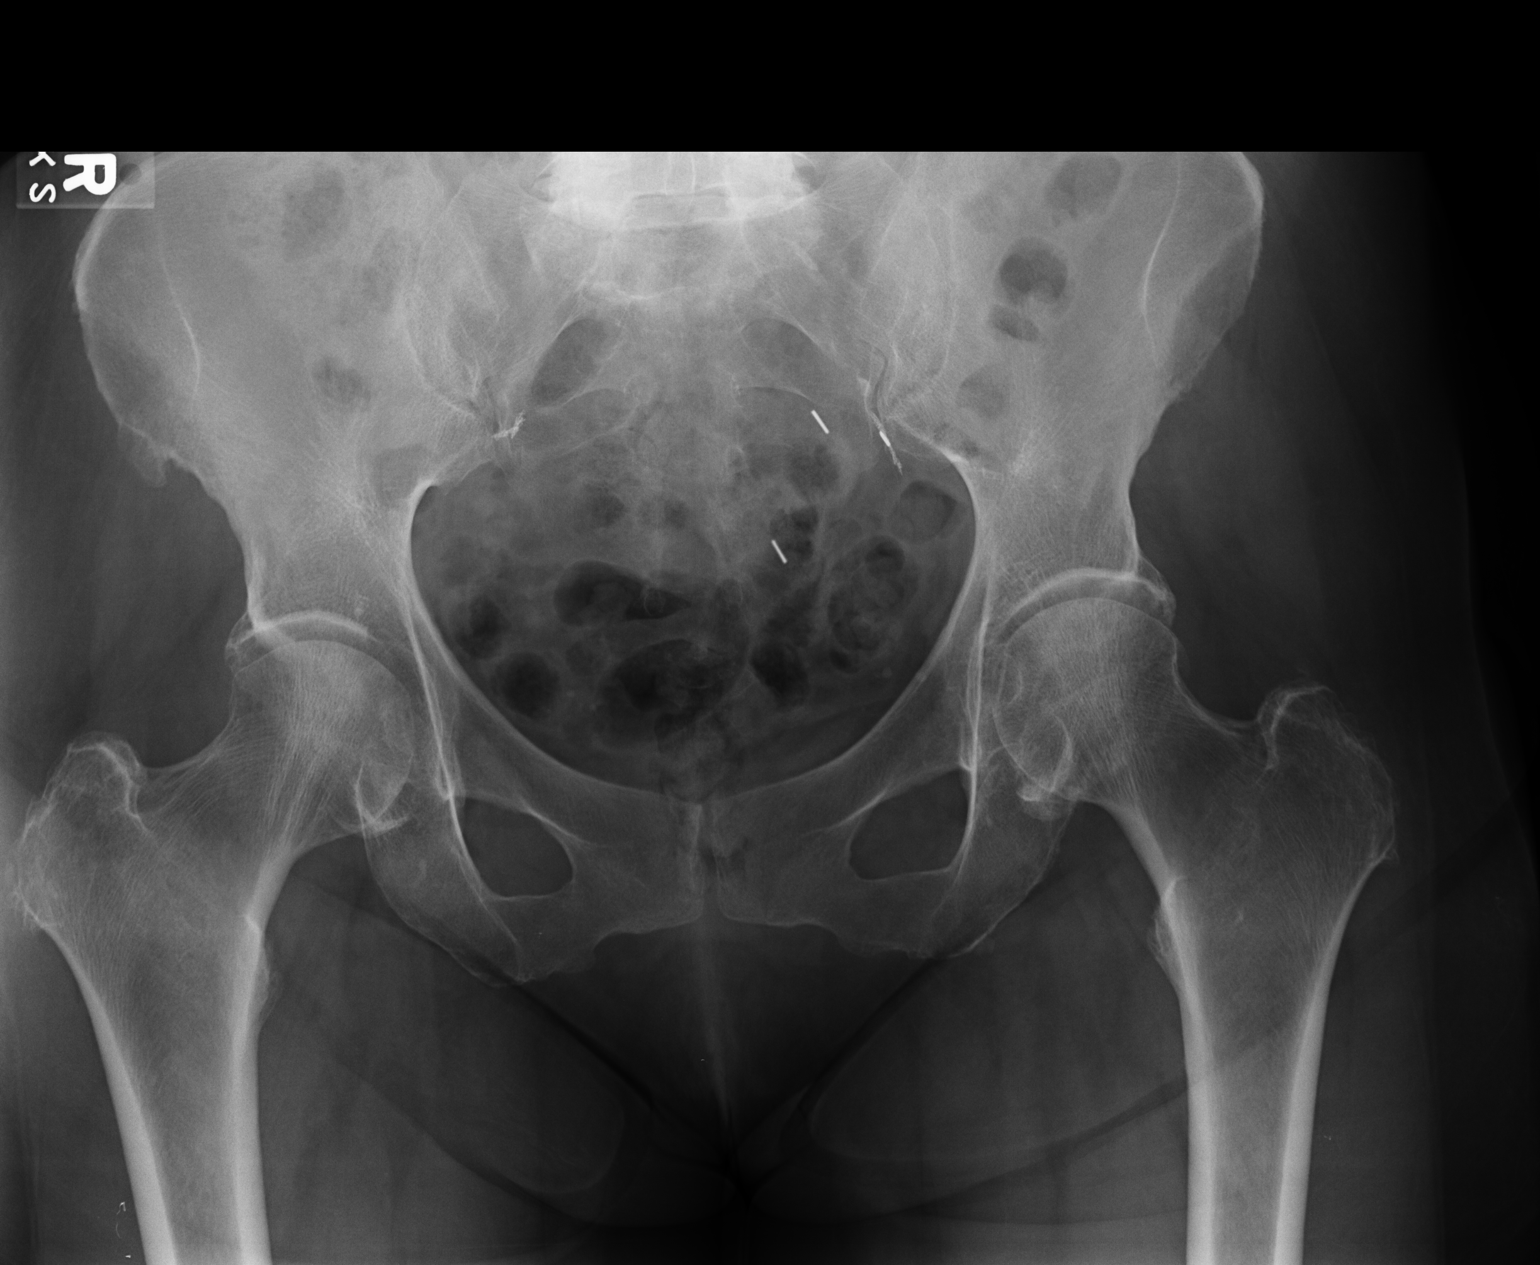
[im 2/2]
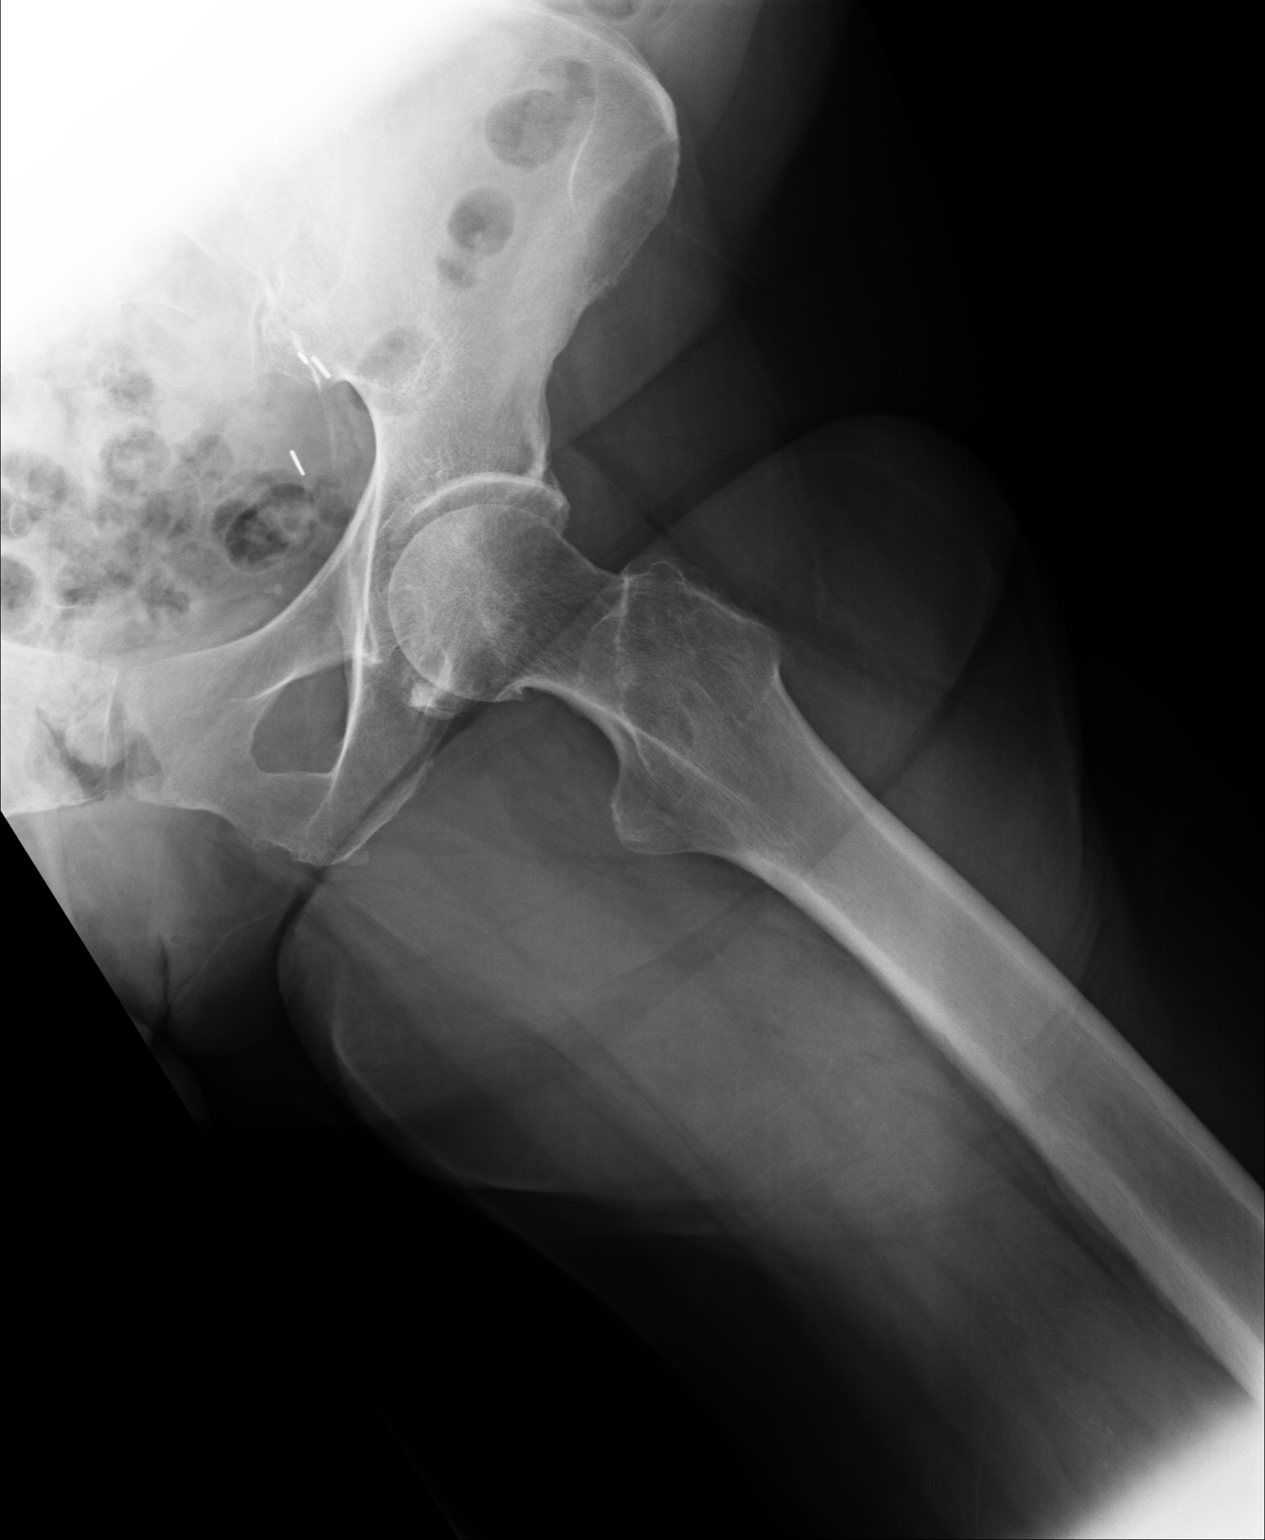

[2 of 2 positions shown; findings below may reference images not displayed]

FINDINGS: No acute bony lesions of pelvis and left hip.  Moderate osteoarthritis of both hip joints is noted.  No plain radiographic evidence of avascular necrosis of femoral head.
IMPRESSION: Moderate osteoarthritic changes of both hips.  No acute bony lesions are seen.  Severe degenerative disc disease and facet arthropathy are suspected in the lower lumbar spine in the limited visualized region of the lower lumbar spine in the AP pelvis.

## 2020-08-10 ENCOUNTER — Other Ambulatory Visit
Admission: RE | Admit: 2020-08-10 | Discharge: 2020-08-10 | Disposition: A | Discharge status: Home / Self Care | Payer: Medicare (Managed Care) | Source: Ambulatory Visit | Attending: Internal Medicine | Admitting: Internal Medicine

## 2020-08-10 DIAGNOSIS — E039 Hypothyroidism, unspecified: Secondary | ICD-10-CM | POA: Insufficient documentation

## 2020-08-10 DIAGNOSIS — E669 Obesity, unspecified: Secondary | ICD-10-CM | POA: Insufficient documentation

## 2020-08-10 LAB — CBC AND DIFFERENTIAL
Baso # K/uL: 0 10*3/uL (ref 0.0–0.1)
Basophil %: 0.7 %
Eos # K/uL: 0.1 10*3/uL (ref 0.0–0.4)
Eosinophil %: 2.6 %
Hematocrit: 43 % (ref 34–45)
Hemoglobin: 14 g/dL (ref 11.2–15.7)
IMM Granulocytes #: 0 10*3/uL (ref 0.0–0.0)
IMM Granulocytes: 0.2 %
Lymph # K/uL: 1.4 10*3/uL (ref 1.2–3.7)
Lymphocyte %: 25.6 %
MCH: 32 pg (ref 26–32)
MCHC: 32 g/dL (ref 32–36)
MCV: 98 fL — ABNORMAL HIGH (ref 79–95)
Mono # K/uL: 0.7 10*3/uL (ref 0.2–0.9)
Monocyte %: 12.9 %
Neut # K/uL: 3.1 10*3/uL (ref 1.6–6.1)
Nucl RBC # K/uL: 0 10*3/uL (ref 0.0–0.0)
Nucl RBC %: 0 /100 WBC (ref 0.0–0.2)
Platelets: 285 10*3/uL (ref 160–370)
RBC: 4.4 MIL/uL (ref 3.9–5.2)
RDW: 13.3 % (ref 11.7–14.4)
Seg Neut %: 58 %
WBC: 5.4 10*3/uL (ref 4.0–10.0)

## 2020-08-10 LAB — COMPREHENSIVE METABOLIC PANEL
ALT: 25 U/L (ref 0–35)
AST: 23 U/L (ref 0–35)
Albumin: 4.3 g/dL (ref 3.5–5.2)
Alk Phos: 80 U/L (ref 35–105)
Anion Gap: 13 (ref 7–16)
Bilirubin,Total: 0.4 mg/dL (ref 0.0–1.2)
CO2: 23 mmol/L (ref 20–28)
Calcium: 9.6 mg/dL (ref 8.6–10.2)
Chloride: 103 mmol/L (ref 96–108)
Creatinine: 0.78 mg/dL (ref 0.51–0.95)
Glucose: 111 mg/dL — ABNORMAL HIGH (ref 60–99)
Lab: 19 mg/dL (ref 6–20)
Potassium: 4.5 mmol/L (ref 3.3–5.1)
Sodium: 139 mmol/L (ref 133–145)
Total Protein: 7.2 g/dL (ref 6.3–7.7)
eGFR BY CREAT: 82 *

## 2020-08-10 LAB — VITAMIN D: 25-OH Vit Total: 42 ng/mL (ref 30–60)

## 2020-08-10 LAB — LIPID PANEL
Chol/HDL Ratio: 2.9
Cholesterol: 262 mg/dL — AB
HDL: 91 mg/dL — ABNORMAL HIGH (ref 40–60)
LDL Calculated: 157 mg/dL — AB
Non HDL Cholesterol: 171 mg/dL
Triglycerides: 71 mg/dL

## 2020-08-10 LAB — TSH: TSH: 1.48 u[IU]/mL (ref 0.27–4.20)

## 2020-09-01 ENCOUNTER — Encounter: Payer: Self-pay | Admitting: Gastroenterology

## 2020-09-02 ENCOUNTER — Other Ambulatory Visit: Payer: Self-pay | Admitting: Internal Medicine

## 2020-09-02 ENCOUNTER — Encounter: Payer: Self-pay | Admitting: Gastroenterology

## 2020-09-02 DIAGNOSIS — R9431 Abnormal electrocardiogram [ECG] [EKG]: Secondary | ICD-10-CM

## 2020-09-07 ENCOUNTER — Emergency Department
Admission: EM | Admit: 2020-09-07 | Discharge: 2020-09-07 | Discharge status: Against Medical Advice | Payer: Medicare (Managed Care) | Source: Ambulatory Visit | Attending: Emergency Medicine | Admitting: Emergency Medicine

## 2020-09-07 DIAGNOSIS — Z5321 Procedure and treatment not carried out due to patient leaving prior to being seen by health care provider: Secondary | ICD-10-CM | POA: Insufficient documentation

## 2020-09-08 ENCOUNTER — Other Ambulatory Visit: Payer: Self-pay | Admitting: Internal Medicine

## 2020-09-08 ENCOUNTER — Encounter: Payer: Self-pay | Admitting: Gastroenterology

## 2020-09-08 DIAGNOSIS — R002 Palpitations: Secondary | ICD-10-CM

## 2020-09-13 ENCOUNTER — Ambulatory Visit
Admission: RE | Admit: 2020-09-13 | Discharge: 2020-09-13 | Disposition: A | Discharge status: Home / Self Care | Payer: Medicare (Managed Care) | Source: Ambulatory Visit

## 2020-09-13 DIAGNOSIS — R002 Palpitations: Secondary | ICD-10-CM

## 2020-09-18 DIAGNOSIS — R002 Palpitations: Secondary | ICD-10-CM

## 2020-09-18 LAB — HOLTER MONITOR - 24 HOUR
AF Count: 0
AIVR/IVR Runs: 0
Analysis Time: 0:0 {titer}
Analyze Time Length Hours: 24
Analyze Time Length Minutes: 10
Bigeminy: 0
Bradycardia Runs: 0
Couplet: 0
Max Heart Rate: 101
Mean Heart Rate: 77
Min Heart Rate: 61
Pauses: 0
R on T: 0
SVE Max Per Hour Time: 7:0 {titer}
SVE Max Per Hour: 2
SVE Percent Beats: 0.01 %
SVE Total Beats: 8
SVE couplet: 0
SVT Runs: 0
Tachycardia Runs: 0
Total Beats: 111248
Trigeminy: 0
Triplet: 0
VE Max Per Hour Time: 12:0 {titer}
VE Max Per Hour: 73
VE Percent Beats: 0.15 %
VE Total Beats: 169
VT Runs: 0

## 2020-09-20 ENCOUNTER — Telehealth: Payer: Self-pay

## 2020-09-20 NOTE — Telephone Encounter (Signed)
Spoke with Threasa Beards, a nurse at Dr. Florene Glen office.    The report of this patient's holter monitor does have a typo.  Under the second bullet point "Significant bradycardia" it should read "No significant bradycardia". In the report her average HR is 77 bpm and minimum HR 61 bpm.  Will correct the report and re-send to Dr. Abel Presto.

## 2020-09-20 NOTE — Telephone Encounter (Signed)
Debbie from Dr Florene Glen office called to get clarification on the holter reading for this patient.  She can be reached at [phone] opt [phone]

## 2020-09-27 ENCOUNTER — Ambulatory Visit
Admission: RE | Admit: 2020-09-27 | Discharge: 2020-09-27 | Disposition: A | Discharge status: Home / Self Care | Payer: Medicare (Managed Care) | Source: Ambulatory Visit

## 2020-09-27 DIAGNOSIS — R9431 Abnormal electrocardiogram [ECG] [EKG]: Secondary | ICD-10-CM

## 2020-09-27 LAB — EXERCISE STRESS ECHO COMPLETE
Aortic Diameter (mid tubular): 3 cm
Aortic Diameter (sinus of Valsalva): 3 cm
BMI: 32.6 kg/m2
BP Diastolic: 90 mmHg
BP Systolic: 150 mmHg
BSA: 1.83 m2
Deceleration Time - MV: 198 ms
E/A ratio: 1.05
Echo RV Stroke Work Index Estimate: 866.5 mmHg•mL/m2
Estimated workload: 7.3 METS
Heart Rate: 83 {beats}/min
Height: 61 in
LA Diameter BSA Index: 1.7 cm/m2
LA Diameter Height Index: 2 cm/m
LA Diameter: 3.1 cm
LA Systolic Vol BSA Index: 27 mL/m2
LA Systolic Vol Height Index: 31.9 mL/m
LA Systolic Volume: 49.4 mL
LV ASE Mass BSA Index: 64.3 gm/m2
LV ASE Mass Height 2.7 Index: 36.1 gm/m2.7
LV ASE Mass Height Index: 76 gm/m
LV ASE Mass: 117.8 gm
LV CO BSA Index: 2.91 L/min/m2
LV Cardiac Output: 5.33 L/min
LV Diastolic Volume Index: 49.1 mL/m2
LV Posterior Wall Thickness: 0.86 cm
LV SV - LVOT SV Diff: 0.63 mL
LV SV - LVOT SV Diff: 0.98 %
LV SV BSA Index: 35.1 mL/m2
LV SV Height Index: 41.4 mL/m
LV Septal Thickness: 1.14 cm
LV Stroke Volume: 64.2 mL
LV Systolic Volume Index: 14 mL/m2
LV wall/cavity ratio: 0.52
LVED Diameter BSA Index: 2.08 cm/m2
LVED Diameter Height Index: 2.46 cm/m
LVED Diameter: 3.81 cm
LVED Volume BSA Index: 49 ml/m2
LVED Volume BSA Index: 49.1 mL/m2
LVED Volume Height Index: 58 mL/m
LVED Volume: 89.9 mL
LVEF (Volume): 71 %
LVES Volume BSA Index: 14 mL/m2
LVES Volume BSA Index: 14 ml/m2
LVES Volume Height Index: 16.6 mL/m
LVES Volume: 25.7 mL
LVOT Area (calculated): 2.92 cm2
LVOT Cardiac Index: 2.88 L/min/m2
LVOT Cardiac Output: 5.28 L/min
LVOT Diameter: 1.93 cm
LVOT PWD VTI: 21.74 cm
LVOT PWD Velocity (mean): 62 cm/s
LVOT PWD Velocity (peak): 101.2 cm/s
LVOT SV BSA Index: 34.74 mL/m2
LVOT SV Height Index: 41 mL/m
LVOT Stroke Rate (mean): 181.3 mL/s
LVOT Stroke Rate (peak): 295.9 mL/s
LVOT Stroke Volume: 63.57 cc
MPHR: 150 {beats}/min
MR Regurgitant Fraction (LV SV Mtd): 0.01
MR Regurgitant Volume (LV SV Mtd): 0.6 mL
MV Peak A Velocity: 93.8 cm/s
MV Peak E Velocity: 98.7 cm/s
O2 sat peak: 95 %
O2 sat rest: 98 %
Peak DBP: 90 mmHg
Peak Gradient - TR: 24.7 mmHg
Peak HR: 156 {beats}/min
Peak SBP: 182 mmHg
Peak Velocity - TR: 248.39 cm/s
Percent MPHR: 104 %
Pulmonary Vascular Resistance Estimate: 4.6 mmHg
RPP: 28392 BPM x mmHG
RR Interval: 722.89 ms
RVED Diameter BSA Index: 0.9 cm/m2
RVED Diameter Height Index: 1.1 cm/m
RVED Diameter: 1.63 cm
SEM (LVOT Mean) mN-s: 41.41 mN-s
SEM (LVOT peak) mN-s: 67.59 mN-s
Stress Peak Stage: 2.01
Stress duration (min): 6 min
Stress duration (sec): 1 s
Weight (lbs): 172 [lb_av]
Weight: 2752 oz

## 2020-09-27 MED ORDER — PERFLUTREN PROTEIN A MICROSPH (OPTISON) IV SUSP *I*
1.5000 mL | INTRAVENOUS | Status: AC | PRN
Start: 2020-09-27 — End: 2020-09-27
  Administered 2020-09-27: 1.5 mL via INTRAVENOUS

## 2020-09-27 NOTE — Discharge Instructions (Signed)
Pt was here for an exercise tolerance test. F/U with provider. Encouraged to resume normal medications. Pt verbalized understanding

## 2020-11-25 ENCOUNTER — Other Ambulatory Visit
Admission: RE | Admit: 2020-11-25 | Discharge: 2020-11-25 | Disposition: A | Discharge status: Home / Self Care | Payer: Medicare (Managed Care) | Source: Ambulatory Visit | Attending: Internal Medicine | Admitting: Internal Medicine

## 2020-11-25 DIAGNOSIS — E7800 Pure hypercholesterolemia, unspecified: Secondary | ICD-10-CM | POA: Insufficient documentation

## 2020-11-25 LAB — BASIC METABOLIC PANEL
Anion Gap: 12 (ref 7–16)
CO2: 25 mmol/L (ref 20–28)
Calcium: 9.2 mg/dL (ref 8.6–10.2)
Chloride: 104 mmol/L (ref 96–108)
Creatinine: 0.93 mg/dL (ref 0.51–0.95)
Glucose: 107 mg/dL — ABNORMAL HIGH (ref 60–99)
Lab: 18 mg/dL (ref 6–20)
Potassium: 4.5 mmol/L (ref 3.3–5.1)
Sodium: 141 mmol/L (ref 133–145)
eGFR BY CREAT: 66 *

## 2020-11-25 LAB — LIPID PANEL
Chol/HDL Ratio: 2
Cholesterol: 177 mg/dL
HDL: 90 mg/dL — ABNORMAL HIGH (ref 40–60)
LDL Calculated: 73 mg/dL
Non HDL Cholesterol: 87 mg/dL
Triglycerides: 70 mg/dL

## 2020-12-27 IMAGING — MG 3D SCREENING MAMMO BIL W/CAD & TOMO
5 series · 8 of 24 positions shown · non-contrast
Comparison: 08/28/2021 and 09/05/2019.

------------- REPORT [id number] -------------
﻿

EXAM:  3D BILATERAL ANNUAL SCREENING DIGITAL MAMMOGRAM WITH CAD AND TOMOSYNTHESIS
INDICATION: Screening.

[L]
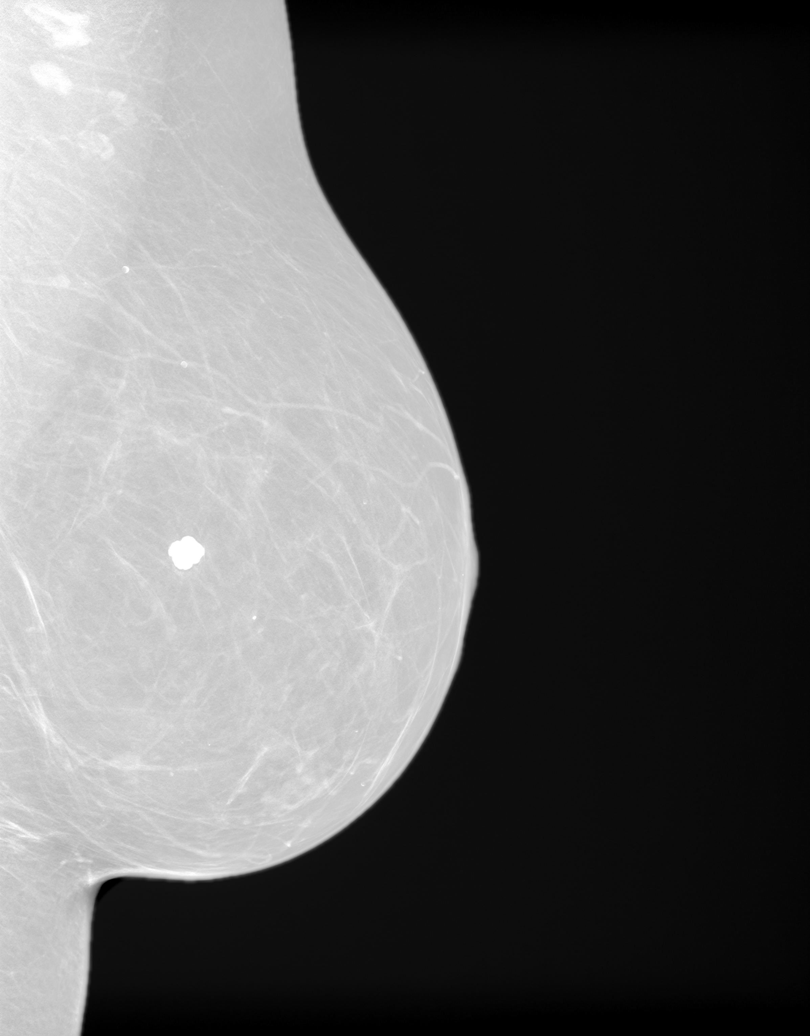

[R]
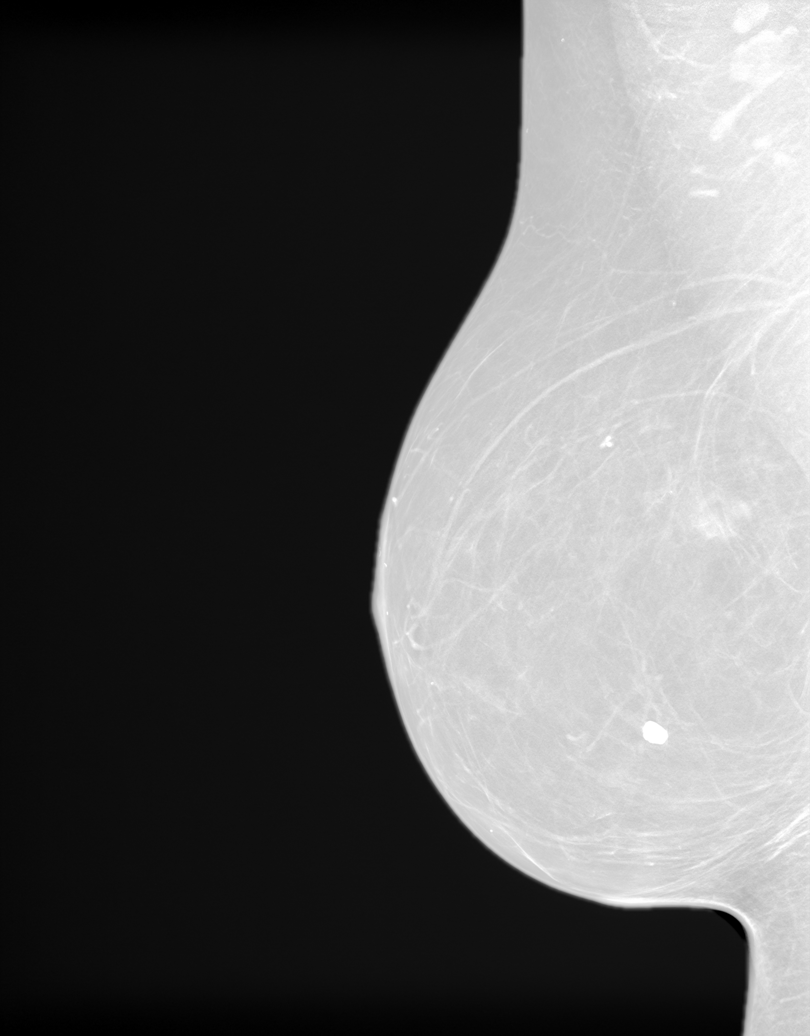

[R CC tomo · right · 0.10mm/px · 2 of 2 slices shown]
[im 1/2]
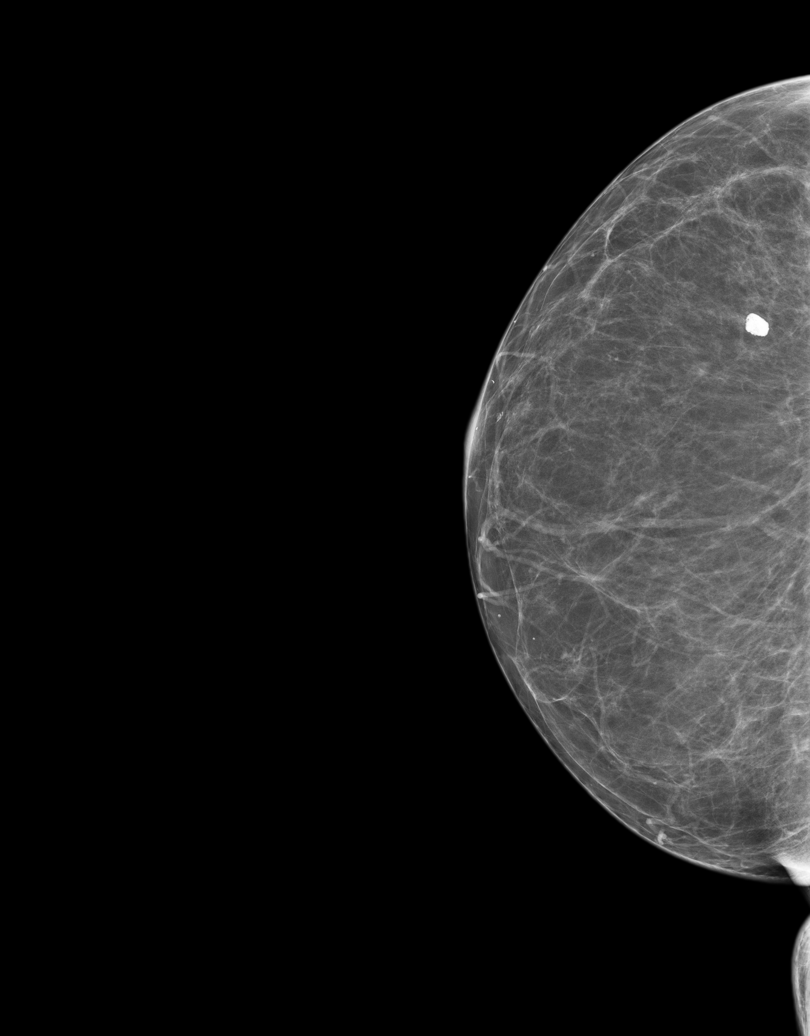
[im 2/2]
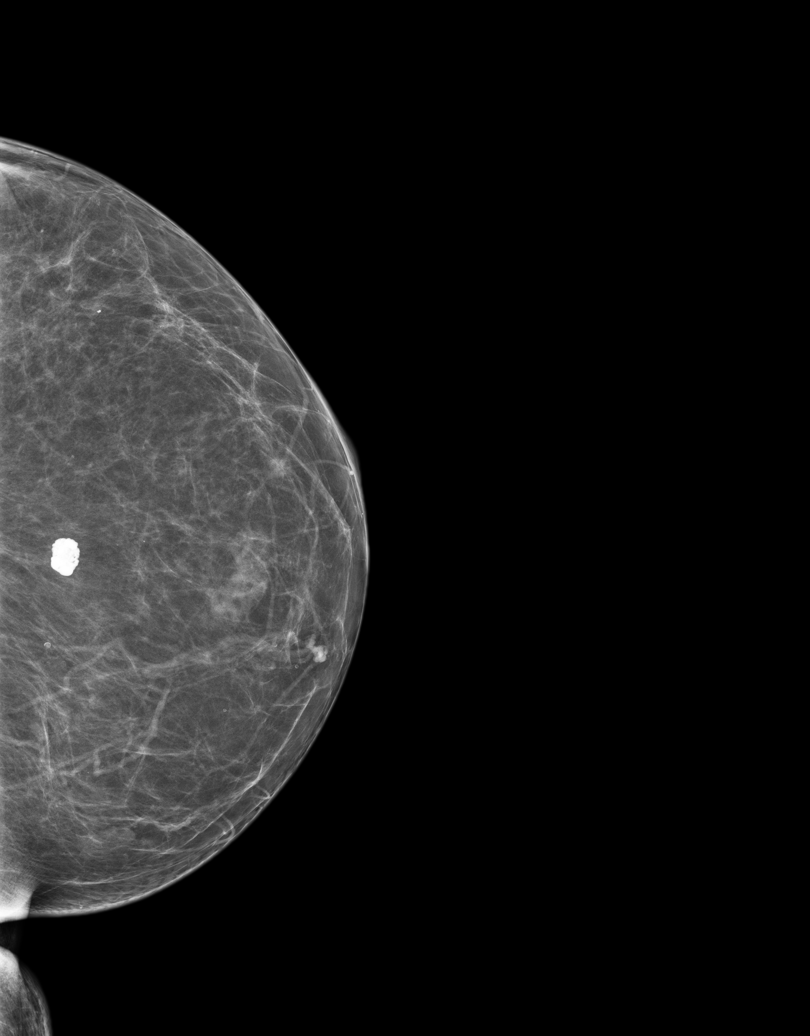

[3D SCREENING MAMMO BIL W/CAD & TOMO tomo · 2 acquisitions, 3 frames shown (1 of 2)]
[im 1/2]
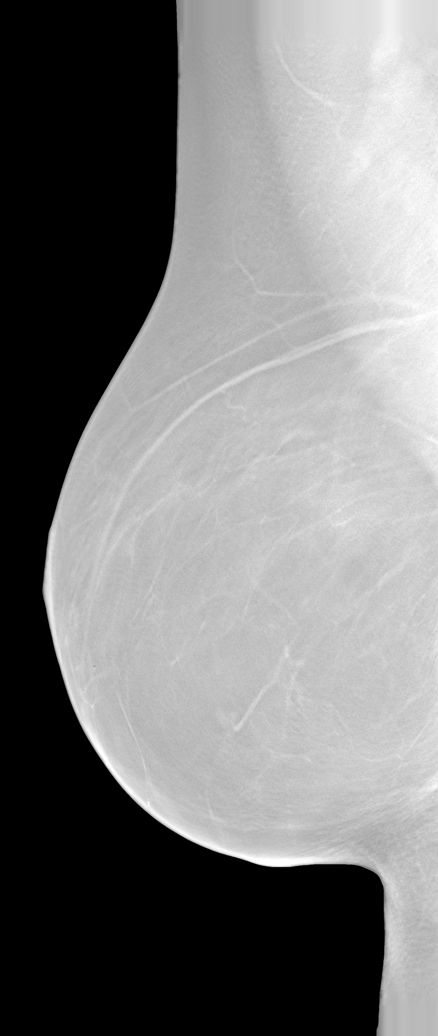
[im 2/2]
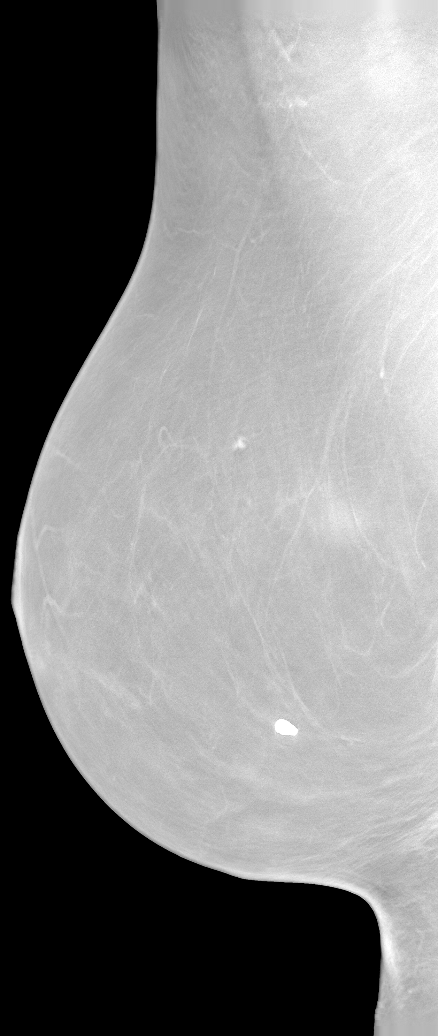
[im 2/2]
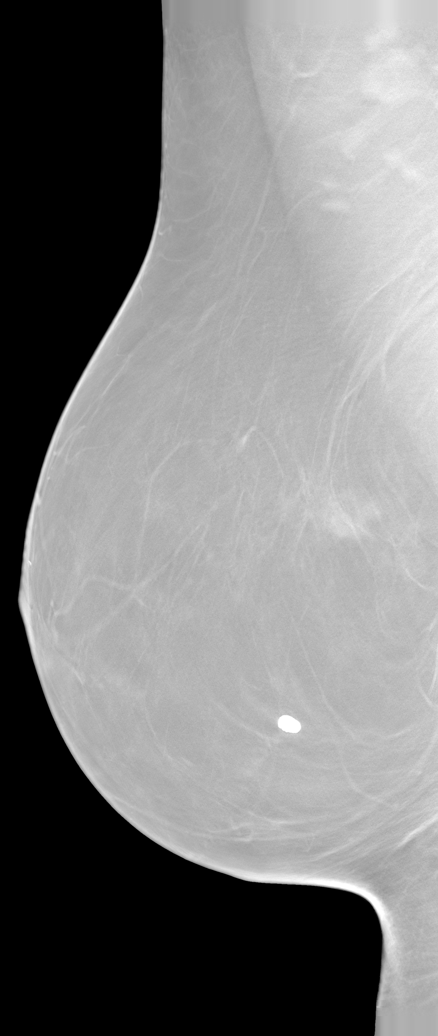

[3D SCREENING MAMMO BIL W/CAD & TOMO tomo (2 of 2) · tomo slice 12/73.0]
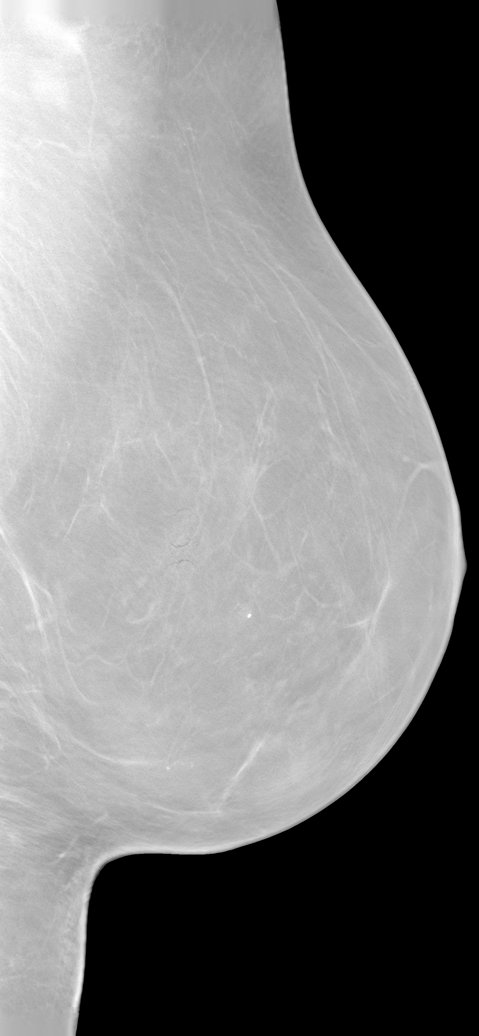

[8 of 24 positions shown; findings below may reference images not displayed]

FINDINGS: There are scattered fibroglandular elements.  There is no mass or suspicious cluster of microcalcifications.   There is no architectural distortion, skin thickening or nipple retraction.
IMPRESSION: 1.  BIRADS 2-Benign findings. Patient has been added in a reminder system with a target date for the next screening mammography.

2.  DENSITY CODE – B (Scattered areas of fibroglandular density). 

Final Assessment Code:

Bi-Rads 2 

BI-RADS 0
 Need additional imaging evaluation.

BI-RADS 1
 Negative mammogram.

BI-RADS 2
 Benign finding.

BI-RADS 3
 Probably benign finding; short-interval follow-up suggested.

BI-RADS 4
 Suspicious abnormality; biopsy should be considered.

BI-RADS 5
 Highly suggestive of malignancy; appropriate action should be taken.

BI-RADS 6
 Known biopsy-proven malignancy; appropriate action should be taken.

NOTE:
In compliance with Federal regulations, the results of this mammogram are being sent to the patient.

------------- REPORT [id number] -------------
[facility]
[address]
We wish to report the following on your recent mammography examination. We are sending a report to your referring physician or other health care provider. 
(       Normal/Negative:
No evidence of cancer.
This statement is mandated by the [place], Department of Health.
Your examination was performed by one of our technologists, who are registered radiological technologists and also specially certified in mammography:
___
Silomea, Viliame R (M)

Your mammogram was interpreted by our radiologist.

( 
Kyuhong Jeang, M.D.

(Annual Breast Examination by a physician or other health care provider
(Annual Mammography Screening beginning at age 40
(Monthly Breast Self Examination

## 2021-05-17 ENCOUNTER — Other Ambulatory Visit
Admission: RE | Admit: 2021-05-17 | Discharge: 2021-05-17 | Disposition: A | Discharge status: Home / Self Care | Payer: Medicare (Managed Care) | Source: Ambulatory Visit | Attending: Internal Medicine | Admitting: Internal Medicine

## 2021-05-17 DIAGNOSIS — R7301 Impaired fasting glucose: Secondary | ICD-10-CM | POA: Insufficient documentation

## 2021-05-17 DIAGNOSIS — R0600 Dyspnea, unspecified: Secondary | ICD-10-CM | POA: Insufficient documentation

## 2021-05-17 DIAGNOSIS — E039 Hypothyroidism, unspecified: Secondary | ICD-10-CM | POA: Insufficient documentation

## 2021-05-17 DIAGNOSIS — E669 Obesity, unspecified: Secondary | ICD-10-CM | POA: Insufficient documentation

## 2021-05-17 DIAGNOSIS — E7800 Pure hypercholesterolemia, unspecified: Secondary | ICD-10-CM | POA: Insufficient documentation

## 2021-05-17 DIAGNOSIS — R001 Bradycardia, unspecified: Secondary | ICD-10-CM | POA: Insufficient documentation

## 2021-05-17 DIAGNOSIS — Z0189 Encounter for other specified special examinations: Secondary | ICD-10-CM | POA: Insufficient documentation

## 2021-05-17 LAB — BASIC METABOLIC PANEL
Anion Gap: 9 (ref 7–16)
CO2: 26 mmol/L (ref 20–28)
Calcium: 9.4 mg/dL (ref 8.6–10.2)
Chloride: 106 mmol/L (ref 96–108)
Creatinine: 0.88 mg/dL (ref 0.51–0.95)
Glucose: 117 mg/dL — ABNORMAL HIGH (ref 60–99)
Lab: 22 mg/dL — ABNORMAL HIGH (ref 6–20)
Potassium: 4.5 mmol/L (ref 3.3–5.1)
Sodium: 141 mmol/L (ref 133–145)
eGFR BY CREAT: 70 *

## 2021-05-17 LAB — LIPID PANEL
Chol/HDL Ratio: 2
Cholesterol: 155 mg/dL
HDL: 77 mg/dL — ABNORMAL HIGH (ref 40–60)
LDL Calculated: 64 mg/dL
Non HDL Cholesterol: 78 mg/dL
Triglycerides: 68 mg/dL

## 2021-05-17 LAB — HEMOGLOBIN A1C: Hemoglobin A1C: 6.1 % — ABNORMAL HIGH

## 2021-05-17 LAB — T4, FREE: Free T4: 1.4 ng/dL (ref 0.9–1.7)

## 2021-05-17 LAB — TSH: TSH: 5.27 u[IU]/mL — ABNORMAL HIGH (ref 0.27–4.20)

## 2021-05-19 NOTE — Progress Notes (Signed)
Breast Health Program      MARGIT BATTE  [id number]    REFERRING PHYSICIAN: Deri Fuelling, MD    HPI: [name] is a lovely 71 y.o. woman seen on 05/24/2021  at [place] at [facility] for routine follow-up. She is currently 4 years 3 months S/P left partial mastectomy with ultimate bilateral mastectomy for  Cancer Staging   Ductal carcinoma in situ (DCIS) of left breast  Staging form: Breast, AJCC 8th Edition  - Clinical stage from 02/28/2017: Stage Unknown (cTis (DCIS), cNX, cM0, ER: Positive, PR: Positive, HER2: Not assessed ) - Signed by [name], NP on 03/08/2017  - Pathologic stage from 04/07/2017: Stage 0 (pTis (DCIS), pN0(sn), cM0, ER: Positive, PR: Positive, HER2: Not assessed ) - Signed by [name], MD on 04/07/2017  . Her oncologic history is as follows:    Oncology History   Ductal carcinoma in situ (DCIS) of left breast   02/28/2017 Surgery    Left excisional breast biopsy for atypia (Dr. Freda Munro)    Cancer Staging  Ductal carcinoma in situ (DCIS) of left breast  Staging form: Breast, AJCC 8th Edition  - Clinical stage from 02/28/2017: Stage Unknown (cTis (DCIS), cNX, cM0, ER: Positive, PR: Positive, HER2: Not assessed ) - Signed by [name], NP on 03/08/2017  : Tumor size: 28m.  ER: 99%, strong staining; PR: 99%, strong staining;  cribiform and micropapillary type, NG1 with focal, punctate necrosis; closest margin: posterior (<142m, lateral (37m59mand medial (<37mm35m    02/28/2017 Initial Diagnosis    Ductal carcinoma in situ (DCIS) of left breast     02/28/2017 Significant Lab Findings    Cancer Staging  Ductal carcinoma in situ (DCIS) of left breast  Staging form: Breast, AJCC 8th Edition  - Clinical stage from 02/28/2017: Stage Unknown (cTis (DCIS), cNX, cM0, ER: Positive, PR: Positive, HER2: Not assessed ) - Signed by [name] on 03/08/2017  : ER: 99%, strong staining; PR: 99%, strong staining;   In situ component: 100%, cribiform type, NG1  with necrosis, 6cm. Closest margin: posterior <37mm.37m   03/28/2017 Surgery    Bilateral total mastectomy, left sentinel node biopsy, immediate tissue expander reconstruction- Ann OMinus LibertyHowarMarland Mcalpine 03/28/2017 Significant Lab Findings    Cancer Staging  Ductal carcinoma in situ (DCIS) of left breast  Staging form: Breast, AJCC 8th Edition  - Clinical stage from 02/28/2017: Stage Unknown (cTis (DCIS), cNX, cM0, ER: Positive, PR: Positive, HER2: Not assessed ) - Signed by RegisOralia Manison 03/08/2017  - Pathologic stage from 04/07/2017: Stage 0 (pTis (DCIS), pN0(sn), cM0, ER: Positive, PR: Positive, HER2: Not assessed ) - Signed by [name] 04/07/2017  : Tumor size: 90mm.33m: 99%, strong staining; PR: 99%, strong staining;  Cribiform, micropapillary, papillary type, NG1 w  ith necrosis; without skin involvement, without nipple involvement, without muscle involvement. Closest margin: posterior ,37mm; 018mSLN/ALN, USC/VNPI: (Age score = 1) + (Path score = 1) + (Margin score = 3) + (Size score = 3) = 8, intermediate risk.            She is without complaints.  She notes an area that is more prominent in the left reconstructed breast that she'd like to follow up with Dr. LangsteVarney Daily      Denies any arm swelling.      Working with her PCP regarding palpitations. She's  noticed they've been occurring more frequently now after recovering from [place] in January.      She is in good health and spirits and has no new symptoms referable to her breasts.    PAST MEDICAL AND SURGICAL HISTORY, SOCIAL AND FAMILY HISTORY: These were documented on the patient intake form and reviewed with the patient and notes no significant changes since her last visit with [place].     MEDICATIONS:   Current Outpatient Medications   Medication Sig    amoxicillin (AMOXIL) 500 MG tablet as needed    atorvastatin (LIPITOR) 20 mg tablet Take 20 mg by mouth daily    buPROPion (WELLBUTRIN XL) 300 mg 24 hr tablet  Take 300 mg by mouth every morning    carvedilol (COREG) 6.25 mg tablet Take 6.25 mg by mouth 2 times daily    levothyroxine (SYNTHROID, LEVOTHROID) 112 mcg tablet Take 112 mcg by mouth daily    desvenlafaxine (PRISTIQ) 50 mg 24 hr tablet Take 25 mg by mouth daily      fluticasone (FLONASE) 50 MCG/ACT nasal spray 1 spray by Nasal route daily as needed for Rhinitis    hydrOXYzine HCl (ATARAX) 25 MG tablet Take 25 mg by mouth 3 times daily as needed for Itching    ibuprofen (ADVIL,MOTRIN) 400 MG tablet Take 2 tablets (800 mg total) by mouth 3 times daily as needed for Pain    melatonin 3 MG Take 3 mg by mouth nightly as needed for Sleep     No current facility-administered medications for this visit.       ALLERGIES: has No Known Allergies (drug, envir, food or latex).    REVIEW OF SYSTEMS: Comprehensive ROS was taken on the patient intake form and reviewed with the patient.  Pertinent items are noted in HPI.    PHYSICAL EXAMINATION:  Vitals: BP 134/71    Pulse 86    Temp 37 C (98.6 F) (Temporal)    Wt 80.3 kg (176 lb 14.7 oz)    SpO2 100%    BMI 33.43 kg/m   General appearance: alert, appears stated age and cooperative  Head: Normocephalic, without obvious abnormality, atraumatic  Eyes: conjunctivae/corneas clear. PERRL, EOM's intact.  Neck: no adenopathy and supple, symmetrical, trachea midline  Back: symmetric, no curvature. ROM normal. No CVA tenderness.  Lungs: Unlabored respirations  Abdomen: Soft, nontender, nondistended, no organomegaly, no palpable masses.  Extremities: extremities normal, atraumatic, no cyanosis or edema. There is not evidence of lymphedema. ROM is within normal limits  Pulses: 2+ and symmetric  Skin: Skin color, texture, turgor normal. No rashes or lesions  Lymph nodes: Cervical, supraclavicular, and axillary nodes normal.  Neurologic: Grossly normal  Breasts: Comprehensive breast examination was performed in the seated and supine positions. Both breasts are surgically absent,  with Immediate Post Mastectomy Tissue Expander/Implant reconstruction. The surgical scars are well healed.     The skin is without erythema, edema, nodules or other lesions. There are no skin changes on arm maneuvers. On palpation there are no masses palpable in either mastectomy site/reconstruction. There is a prominent area in the central left reconstructed breast that seem to correlate with a seam or ridge of the underlying implant.       IMAGING:   None    IMPRESSION: Clinically NED 4 years 3 months S/P left partial mastectomy with ultimate bilateral mastectomy for  Cancer Staging   Ductal carcinoma in situ (DCIS) of left breast  Staging form: Breast, AJCC 8th Edition  - Clinical  stage from 02/28/2017: Stage Unknown (cTis (DCIS), cNX, cM0, ER: Positive, PR: Positive, HER2: Not assessed ) - Signed by [name], NP on 03/08/2017  - Pathologic stage from 04/07/2017: Stage 0 (pTis (DCIS), pN0(sn), cM0, ER: Positive, PR: Positive, HER2: Not assessed ) - Signed by [name], MD on 04/07/2017       PLAN:  1) Surveillance plan: Precious Bard will transition to our Survivorship clinic in 1 year's time. She no longer requires routine screening mammography as she is s/p bilateral mastectomy.   2) We discussed evidence-based lifestyle interventions associated with reducing the risk of breast cancer development including: goal of 75 min of vigorous intensity or 150 min of moderate intensity exercise per week, maintaining a normal body weight, limiting consumption of alcohol to 3 or fewer alcoholic beverages per week, consuming a diet high in whole foods, protection from UV exposure/sunscreen use, and avoiding tobacco products.   3) Follow up with Dr. Varney Daily regarding area in left reconstructed breast.     She knows to remain breast aware and to contact the office in the interim with any questions or concerns as they arise.     I personally spent 22 minutes on the calendar day of the encounter, including pre and post  visit work.        [name], NP

## 2021-05-24 ENCOUNTER — Ambulatory Visit: Payer: Medicare (Managed Care) | Attending: Surgery | Admitting: Surgery

## 2021-05-24 ENCOUNTER — Encounter: Payer: Self-pay | Admitting: Surgery

## 2021-05-24 ENCOUNTER — Other Ambulatory Visit: Payer: Self-pay

## 2021-05-24 VITALS — BP 134/71 | HR 86 | Temp 98.6°F | Wt 176.9 lb

## 2021-05-24 DIAGNOSIS — Z9889 Other specified postprocedural states: Secondary | ICD-10-CM | POA: Insufficient documentation

## 2021-05-24 DIAGNOSIS — D0512 Intraductal carcinoma in situ of left breast: Secondary | ICD-10-CM | POA: Insufficient documentation

## 2021-05-26 ENCOUNTER — Telehealth: Payer: Self-pay | Admitting: Surgery

## 2021-05-26 NOTE — Telephone Encounter (Signed)
Spoke with [name] she reports that her left implant has an area that is "poking out" along the incision. No erythema, fever, chills or redness. She will come in Monday at 10:45 for an appointment.

## 2021-05-26 NOTE — Telephone Encounter (Signed)
Copied from CRM #[id number]. Topic: Access to Care - Speak to Provider/Office Staff  >> May 26, 2021  8:14 AM Jacki Cones B wrote:  Pt calling because she has a slight bump under her incision of her implant.  She is concerned and would like to speak to someone today.  Marland Kitchen

## 2021-05-29 ENCOUNTER — Ambulatory Visit: Payer: Medicare (Managed Care) | Admitting: Surgery

## 2021-05-29 ENCOUNTER — Other Ambulatory Visit: Payer: Self-pay

## 2021-05-29 DIAGNOSIS — Z9889 Other specified postprocedural states: Secondary | ICD-10-CM

## 2021-05-29 NOTE — Progress Notes (Signed)
Progress Note       History of Present Illness    Today I had the pleasure of seeing MARIETOU HARPSTER who underwent revision of her bilateral breast reconstructions with silicone implant (Allergan [id number]) on 07/25/17 and returns today for a routine follow-up visit. She is doing well. She is concerned about a small area of the left breast where she can feel the implant. She noticed this for the first time last week. She also had oncology follow up last week.    Physical Exam  Vital signs: There were no vitals taken for this visit.  General appearance: Alert, well appearing, and in no distress.   Skin: Skin color, texture and turgor are overall normal for age.  Focused detailed exam of bilateral breasts: Well healed incisions to bilateral breast reconstructions.  Implants in excellent position with excellent symmetry.  No capsular contracture. Small area of implant herniation along the left breast, just lateral to the breast meridian and just beneath the IMF scar.    Assessment and Recommendations    DEVANIE WAGERS is a 71 y.o. female here for a routine follow-up visit s/p silicone implant exchange 4 years ago. She is doing well, there is a small area of implant herniation on the left side but this is not bothersome enough for her to wish to pursue surgery.    - Recommend routine implant surveillance starting next year (5 years post implant exchange)  - Follow up as needed    Lavone Nian, MD  Plastic Surgery    I saw and evaluated the patient. I have reviewed and edited the resident's/fellow's note and confirm the findings and plan of care as documented above.    Corrie Dandy, MD 05/31/2021 9:44 AM

## 2021-06-16 ENCOUNTER — Encounter: Payer: Self-pay | Admitting: Gastroenterology

## 2021-07-04 ENCOUNTER — Other Ambulatory Visit: Payer: Self-pay

## 2021-07-04 ENCOUNTER — Ambulatory Visit
Admission: RE | Admit: 2021-07-04 | Discharge: 2021-07-04 | Disposition: A | Discharge status: Home / Self Care | Payer: Medicare (Managed Care) | Source: Ambulatory Visit | Attending: Internal Medicine | Admitting: Internal Medicine

## 2021-07-04 ENCOUNTER — Other Ambulatory Visit: Payer: Self-pay | Admitting: Internal Medicine

## 2021-07-04 DIAGNOSIS — R059 Cough, unspecified: Secondary | ICD-10-CM

## 2021-07-14 ENCOUNTER — Encounter: Payer: Self-pay | Admitting: Cardiology

## 2021-07-14 ENCOUNTER — Other Ambulatory Visit: Payer: Self-pay

## 2021-07-14 ENCOUNTER — Ambulatory Visit: Payer: Medicare (Managed Care) | Admitting: Cardiology

## 2021-07-14 VITALS — BP 140/80 | HR 96 | Ht 61.0 in | Wt 176.0 lb

## 2021-07-14 DIAGNOSIS — R9431 Abnormal electrocardiogram [ECG] [EKG]: Secondary | ICD-10-CM

## 2021-07-14 DIAGNOSIS — R0602 Shortness of breath: Secondary | ICD-10-CM

## 2021-07-14 DIAGNOSIS — R002 Palpitations: Secondary | ICD-10-CM

## 2021-07-14 DIAGNOSIS — Z789 Other specified health status: Secondary | ICD-10-CM

## 2021-07-14 DIAGNOSIS — I493 Ventricular premature depolarization: Secondary | ICD-10-CM

## 2021-07-14 DIAGNOSIS — I517 Cardiomegaly: Secondary | ICD-10-CM

## 2021-07-14 LAB — EKG 12-LEAD
P: 67 deg
PR: 139 ms
QRS: INVALID deg
QRSD: 95 ms
QT: 374 ms
QTc: 473 ms
Rate: 96 {beats}/min
T: -4 deg

## 2021-07-14 NOTE — Progress Notes (Addendum)
CARDIOLOGY CONSULTATION    I had the pleasure of seeing [name] in consultation for further evaluation of palpitations and dyspnea.  The patient is a 71 year old with no prior cardiac history.  Last summer she was experiencing symptoms of palpitations and dyspnea.  She had a 24-hour Holter monitor that showed no concerning arrhythmia.  She also had a stress echo which showed no evidence of myocardial ischemia or major structural/functional abnormalities of the heart.  Thereafter she seemed to feel better.  However more recently she is more bothered by the palpitations.  She describes her heart skipping frequently.  This is very disconcerting to her.  She also has noted some increased shortness of breath at higher levels of activity.  She has not had chest pain.  She has had some lightheaded spells but no syncope.  No orthopnea or progressive edema.  She does note that her symptoms seem to get worse after she had COVID.    PAST MEDICAL HISTORY:  Past Medical History:   Diagnosis Date   . Anxiety    . Arthritis    . Cancer    . Depression    . Ductal carcinoma in situ (DCIS) of left breast    . Hypothyroidism        PAST SURGICAL HISTORY:  Past Surgical History:   Procedure Laterality Date   . KNEE CARTILAGE SURGERY Left 2014   . KNEE SURGERY Left    . PR BX/EXC LYMPH NODE OPEN DEEP AXILLARY NODE Left 03/28/2017    Procedure: AXILLARY LYMPH NODE BIOPSY OR DISSECTION;  Surgeon: Sheliah Mends, MD;  Location: [facility];  Service: Oncology General   . PR EXC BREAST LES PREOP PLMT RAD MARKER OPEN 1 LES Left 02/28/2017    Procedure: LEFT BREAST BIOPSY WITH NEEDLE LOCALIZATION  ;  Surgeon: Sheliah Mends, MD;  Location[facility];  Service: Oncology General   . PR INSERTION BREAST IMPLANT SAME DAY OF MASTECTOMY Bilateral 03/28/2017    Procedure: BILATERAL IMMEDIATE RECONSTRUCTION OF BREASTS WITH TISSUE EXPANDER INSERTION AND ACELLULAR DERMAL MATRIX;  Surgeon: Corrie Dandy, MD;  Location: [facility];   Service: Plastics   . PR INSERTION BREAST IMPLANT SAME DAY OF MASTECTOMY Bilateral 07/25/2017    Procedure: Bilateral Tissue Exapnder Removal with Bilateral Implant Insertion;  Surgeon: Corrie Dandy, MD;  [facility];  Service: Plastics   . PR MASTECTOMY SIMPLE COMPLETE Bilateral 03/28/2017    Procedure: BILATERAL MASTECTOMY;  Surgeon: Sheliah Mends, MD;  Location[facility];  Service: Oncology General   . TONSILLECTOMY         MEDICATIONS:  Current Outpatient Medications   Medication Sig   . carvedilol (COREG) 3.125 mg tablet Take 1 tablet (3.125 mg total) by mouth 2 times daily   . buPROPion (WELLBUTRIN SR) 200 mg 12 hr tablet Take 1 tablet (200 mg total) by mouth daily   . atorvastatin (LIPITOR) 20 mg tablet Take 1 tablet (20 mg total) by mouth daily   . levothyroxine (SYNTHROID, LEVOTHROID) 112 mcg tablet Take 1 tablet (112 mcg total) by mouth daily   . fluticasone (FLONASE) 50 MCG/ACT nasal spray Spray 1 spray into nostril daily as needed for Rhinitis     No current facility-administered medications for this visit.       ALLERGIES:  No Known Allergies (drug, envir, food or latex)    SOCIAL HISTORY:  The patient  reports that she has never smoked. She has never used smokeless  tobacco. She reports current alcohol use of about 7.0 standard drinks of alcohol per week. She reports that she does not use drugs.    FAMILY HISTORY:  family history includes Alzheimer's disease in her mother; Breast cancer in her sister; COPD in her brother; Cancer in her brother, father, and sister; Cancer (age of onset: 46) in her sister; High Blood Pressure in her mother; Lung cancer in her father; No Known Problems in her son, son, and son; Rectal cancer in her brother; Thyroid disease in her sister.    REVIEW OF SYSTEMS:    A 10+ prior history of systems was obtained.  Pertinent positives and negatives per HPI.  All other systems negative.    PHYSICAL EXAM:  Blood pressure 140/80, pulse 96, height 1.549 m (5'  1"), weight 79.8 kg (176 lb)., Body mass index is 33.25 kg/m.Marland Kitchen   GEN: Pleasant, anxious-appearing, no acute distress. HEENT: Sclerae clear, mucous membranes moist. LUNGS: clear to ausculation bilaterally, no wheezes or crackles. Complete CV exam performed, details include: Regular rate and rhythm, normal S1 and S2, no S3 or S4, no murmur, no rub. JVP not elevated. No carotid bruits. No abd bruits. Ext: Trace bilateral edema, intact pedal pulses bilaterally. GI: abd soft, nontender, nondistended, normal bowel sounds, no masses or organomegally. NEURO/PSYCH: Normal affect. No gross focal deficits.    EKG: Today shows sinus rhythm at 96 bpm with frequent PVCs.  Normal axis and intervals.  No Q waves or significant ST-T wave abnormalities.       EXERCISE STRESS ECHO COMPLETE 09/27/2020    Interpretation Summary   Quality of the study is adequate. Presenting rhythm is normal sinus   No significant valvular abnormalities. No evidence of mitral valve prolapse   Normal exercise EKG without evidence of ischemia   Normal exercise echocardiogram without evidence of ischemia   Average functional capacity for age and gender   Baseline hypertension with normal blood pressure response to exercise. Peak blood pressure was 182/98 in recovery. Starting blood pressure was 150/90   Maximal stress test based on heart rate response and rate-pressure product      LABS/DATA:       Lab results: 05/17/21  0837 11/25/20  0836 08/10/20  0826   Sodium 141 141 139   Potassium 4.5 4.5 4.5   Chloride 106 104 103   CO2 26 25 23    UN 22* 18 19   Creatinine 0.88 0.93 0.78   Glucose 117* 107* 111*   Calcium 9.4 9.2 9.6         Lab results: 08/10/20  0826   WBC 5.4   Hemoglobin 14.0   Hematocrit 43   RBC 4.4   Platelets 285         Lab results: 05/17/21  0837   Cholesterol 155   HDL 77*   Triglycerides 68   LDL Calculated 64     Recent chest x-ray without acute abnormality    IMPRESSION/PLAN:  1.  Dyspnea.  The patient presents with acute on  chronic dyspnea.  There is no evidence of heart failure on exam.  A stress echo last August showed no evidence of myocardial ischemia.  I do not think it needs to be repeated.  We did agree to check an echocardiogram to reevaluate cardiac function following COVID infection.  2.  Palpitations.  The patient describes frequent palpitations.  She does have frequent PVCs on her ECG today.  Prior Holter monitoring was reassuring.  With her frequent  PVCs and increasing symptoms I did recommend a Holter monitor.  She would prefer a 48-hour monitor to 24 even though her symptoms are occurring daily.  Depending on those results we could consider increasing her carvedilol dose back to 6.25 mg twice daily.  She has been bradycardic and her dose was reduced previously.  We will hold on that decision till we see the results of the monitor.  3.  Anxiety.  The patient reports she reduced her appropriate dose because she thought it might be causing her palpitations and dyspnea.  I do not think this is likely to be the case.  She does seem quite anxious today.  It may be worth considering going back to her prior bupropion dose.  4.  Upcoming dental work.  This is low risk from a cardiac perspective.  Antibiotics are not indicated prior to the procedure.    Thank you for the opportunity to participate in the care of this very pleasant patient.  We will plan for followup after the upcoming tests have been completed.

## 2021-07-17 ENCOUNTER — Other Ambulatory Visit: Payer: Self-pay

## 2021-07-17 ENCOUNTER — Ambulatory Visit
Admission: RE | Admit: 2021-07-17 | Discharge: 2021-07-17 | Disposition: A | Discharge status: Home / Self Care | Payer: Medicare (Managed Care) | Source: Ambulatory Visit

## 2021-07-17 DIAGNOSIS — R002 Palpitations: Secondary | ICD-10-CM

## 2021-07-17 DIAGNOSIS — R0602 Shortness of breath: Secondary | ICD-10-CM

## 2021-07-25 DIAGNOSIS — R002 Palpitations: Secondary | ICD-10-CM

## 2021-07-25 DIAGNOSIS — R0602 Shortness of breath: Secondary | ICD-10-CM

## 2021-07-25 LAB — HOLTER MONITOR - 48 HOUR
AF Count: 0
AIVR/IVR Runs: 0
Analysis Time: 0:0 {titer}
Analyze Time Length Hours: 41
Analyze Time Length Minutes: 27
Bigeminy: 2802
Bradycardia Runs: 0
Couplet: 44
Max Heart Rate: 132
Mean Heart Rate: 86
Min Heart Rate: 62
Pauses: 0
SV RUN: 0
SVE Max Per Hour Time: 6:0 {titer}
SVE Max Per Hour: 90
SVE Percent Beats: 0.04 %
SVE Total Beats: 92
SVT Runs: 1
SVT longest run time: 6:14 {titer}
SVT longest run: 90
SVT max rate time: 6:14 {titer}
SVT max rate: 167 {beats}/min
Tachycardia Longest Run Time: 1711
Tachycardia Longest Run: 31
Tachycardia Max Rate Time: 1702
Tachycardia Max Rate: 127
Tachycardia Runs: 21
Total Beats: 213681
Trigeminy: 746
Triplet: 1
VE Max Per Hour Time: 10:0 {titer}
VE Max Per Hour: 2841
VE Percent Beats: 24.54 %
VE Total Beats: 52441
VT Runs: 0

## 2021-07-26 ENCOUNTER — Other Ambulatory Visit: Payer: Self-pay | Admitting: Cardiology

## 2021-07-26 ENCOUNTER — Encounter: Payer: Self-pay | Admitting: Cardiology

## 2021-07-26 MED ORDER — CARVEDILOL 6.25 MG PO TABS *I*
6.2500 mg | ORAL_TABLET | Freq: Two times a day (BID) | ORAL | 11 refills | Status: DC
Start: 2021-07-26 — End: 2021-09-26

## 2021-07-26 NOTE — Progress Notes (Signed)
Pt messaged re: frequent symptomatic ventricular ectopy on holter. We will increase carvedilol to 6.25mg  twice daily. Follow up/echo as scheduled next month.

## 2021-08-02 ENCOUNTER — Encounter: Payer: Self-pay | Admitting: Cardiology

## 2021-08-02 NOTE — Telephone Encounter (Signed)
Spoke with patient on the phone about her concerns, she has noted to have very frequently occurring PVCs.  Measured bradycardia today on her home blood pressure cuff although I explained to her this is likely an inaccurate reading with the amount of PVCs she experiences.  She is not having symptoms of lightheadedness or presyncope.  As she is having no high risk features offered reassurance today and would continue her current medical therapy.  She has an echocardiogram and follow-up scheduled in the office.  Patient was reassured and receptive of the plan.

## 2021-08-04 ENCOUNTER — Encounter: Payer: Self-pay | Admitting: Cardiology

## 2021-08-04 NOTE — Telephone Encounter (Signed)
Spoke with patient on the phone it seems she is having both symptoms of hypotension and increased palpitations.  Home blood pressures today were 104 and 96 systolic.  At the same time she is feeling more frequent palpitations throughout the day.  I have encouraged her to stay well-hydrated and if her blood pressure continues to be low over the weekend she may reduce her morning dose of carvedilol to 3.125 mg.  We can try to expedite her echocardiogram if the schedule allows.

## 2021-09-06 ENCOUNTER — Ambulatory Visit: Payer: Medicare (Managed Care) | Admitting: Cardiology

## 2021-09-06 ENCOUNTER — Encounter: Payer: Self-pay | Admitting: Cardiology

## 2021-09-06 ENCOUNTER — Ambulatory Visit
Admission: RE | Admit: 2021-09-06 | Discharge: 2021-09-06 | Disposition: A | Discharge status: Home / Self Care | Payer: Medicare (Managed Care) | Source: Ambulatory Visit

## 2021-09-06 ENCOUNTER — Other Ambulatory Visit: Payer: Self-pay

## 2021-09-06 VITALS — BP 136/80 | HR 95 | Ht 60.0 in | Wt 174.8 lb

## 2021-09-06 DIAGNOSIS — R002 Palpitations: Secondary | ICD-10-CM

## 2021-09-06 DIAGNOSIS — R0602 Shortness of breath: Secondary | ICD-10-CM

## 2021-09-06 LAB — ECHO COMPLETE
Aortic Diameter (mid tubular): 3.5 cm
Aortic Diameter (sino-tubular junction): 2.4 cm
Aortic Diameter (sinus of Valsalva): 2.8 cm
BMI: 34.2 kg/m2
BP Diastolic: 80 mmHg
BP Systolic: 136 mmHg
BSA: 1.83 m2
Deceleration Time - MV: 221 ms
E/A ratio: 0.94
Echo RV Stroke Work Index Estimate: 911.8 mmHg•mL/m2
Heart Rate: 95 {beats}/min
Height: 60 in
IVC Diameter: 1.2 cm
LA Diameter BSA Index: 1.9 cm/m2
LA Diameter Height Index: 2.3 cm/m
LA Diameter: 3.5 cm
LA Systolic Vol BSA Index: 29 mL/m2
LA Systolic Vol Height Index: 34.8 mL/m
LA Systolic Volume: 53 mL
LV ASE Mass BSA Index: 73 gm/m2
LV ASE Mass Height 2.7 Index: 42.8 gm/m2.7
LV ASE Mass Height Index: 87.6 gm/m
LV ASE Mass: 133.6 gm
LV CO BSA Index: 2.67 L/min/m2
LV Cardiac Output: 4.89 L/min
LV Diastolic Volume Index: 50.8 mL/m2
LV Posterior Wall Thickness: 1.01 cm
LV SV - LVOT SV Diff: 0.02 mL
LV SV - LVOT SV Diff: 0.05 %
LV SV BSA Index: 28.1 mL/m2
LV SV Height Index: 33.8 mL/m
LV Septal Thickness: 1.22 cm
LV Stroke Volume: 51.5 mL
LV Systolic Volume Index: 22.7 mL/m2
LV wall/cavity ratio: 0.6
LVED Diameter BSA Index: 2.04 cm/m2
LVED Diameter Height Index: 2.45 cm/m
LVED Diameter: 3.73 cm
LVED Volume BSA Index: 50.8 mL/m2
LVED Volume BSA Index: 51 ml/m2
LVED Volume Height Index: 61 mL/m
LVED Volume: 93 mL
LVEF (Volume): 55 %
LVES Volume BSA Index: 22.7 mL/m2
LVES Volume BSA Index: 23 ml/m2
LVES Volume Height Index: 27.2 mL/m
LVES Volume: 41.5 mL
LVOT Area (calculated): 2.35 cm2
LVOT Cardiac Index: 2.67 L/min/m2
LVOT Cardiac Output: 4.89 L/min
LVOT Diameter: 1.73 cm
LVOT PWD VTI: 21.91 cm
LVOT PWD Velocity (mean): 65.1 cm/s
LVOT PWD Velocity (peak): 102.6 cm/s
LVOT SV BSA Index: 28.13 mL/m2
LVOT SV Height Index: 33.8 mL/m
LVOT Stroke Rate (mean): 152.9 mL/s
LVOT Stroke Rate (peak): 241.1 mL/s
LVOT Stroke Volume: 51.48 cc
MR Regurgitant Fraction (LV SV Mtd): 0
MR Regurgitant Volume (LV SV Mtd): 0 mL
MV Peak A Velocity: 105.2 cm/s
MV Peak E Velocity: 98.4 cm/s
Peak Gradient - TR: 32.4 mmHg
Peak Velocity - TR: 284.65 cm/s
Pulmonary Vascular Resistance Estimate: 6.6 mmHg
RA Volume BSA Index: 11.5 mL/m2
RA Volume Height Index: 13.8 mL/m
RA Volume: 21 mL
RR Interval: 631.58 ms
RVED Diameter BSA Index: 1.1 cm/m2
RVED Diameter Height Index: 1.4 cm/m
RVED Diameter: 2.06 cm
SEM (LVOT Mean) mN-s: 35.21 mN-s
SEM (LVOT peak) mN-s: 55.49 mN-s
Weight (lbs): 174.8 [lb_av]
Weight: 2796.8 oz

## 2021-09-06 MED ORDER — PERFLUTREN PROTEIN A MICROSPH (OPTISON) IV SUSP *I*
1.5000 mL | INTRAVENOUS | Status: AC | PRN
Start: 2021-09-06 — End: 2021-09-06
  Administered 2021-09-06: 1.5 mL via INTRAVENOUS

## 2021-09-06 NOTE — Progress Notes (Signed)
CARDIOLOGY FOLLOWUP     I had the pleasure of seeing [name] in followup for her palpitations due to frequent ventricular ectopy.  Since we last saw her she reduced her carvedilol dose to 3.125 mg twice daily.  On this dose she feels better overall.  Her palpitations are controlled.  She has not had any lightheadedness, dizziness, or syncope.  She has had no chest discomfort or progressive dyspnea.    Allergies, PMFS, & ROS were reviewed and updated. Pertinent details as above.     MEDICATIONS:   Current Outpatient Medications   Medication Sig   . carvedilol (COREG) 6.25 mg tablet Take 1 tablet (6.25 mg total) by mouth 2 times daily (Patient taking differently: Take 0.5 tablets (3.125 mg total) by mouth 2 times daily)   . buPROPion (WELLBUTRIN SR) 200 mg 12 hr tablet Take 1 tablet (200 mg total) by mouth daily   . atorvastatin (LIPITOR) 20 mg tablet Take 1 tablet (20 mg total) by mouth daily   . levothyroxine (SYNTHROID, LEVOTHROID) 112 mcg tablet Take 1 tablet (112 mcg total) by mouth daily   . fluticasone (FLONASE) 50 MCG/ACT nasal spray Spray 1 spray into nostril daily as needed for Rhinitis     No current facility-administered medications for this visit.     Facility-Administered Medications Ordered in Other Visits   Medication   . perflutren protein A microsph (OPTISON) injection 1.5 mL       PHYSICAL EXAM:  Blood pressure 136/80, pulse 95, height 1.524 m (5'), weight 79.3 kg (174 lb 12.8 oz)., Body mass index is 34.14 kg/m.Marland Kitchen GEN: Pleasant, well-appearing, no acute distress. HEENT: Sclerae clear, mucous membranes moist. LUNGS: clear to ausculation bilaterally, no wheezes or crackles. CV: Regular rate and rhythm, normal S1 and S2, no S3 or S4, no murmur, no rub. JVP not elevated. No carotid bruits.  Ext: no edema, intact pedal pulses bilaterally.      LABS/DATA:       Lab results: 05/17/21  0837 11/25/20  0836   Sodium 141 141   Potassium 4.5 4.5   Chloride 106 104   CO2 26 25   UN 22* 18   Creatinine  0.88 0.93   Glucose 117* 107*   Calcium 9.4 9.2     No results for input(s): WBC, HGB, HCT, RBC, PLT in the last 8760 hours.      Lab results: 05/17/21  0837   Cholesterol 155   HDL 77*   Triglycerides 68   LDL Calculated 64   No results for input(s): PROBNP in the last 8760 hours.       ECHO COMPLETE 09/06/2021    Interpretation Summary  LV chamber size with normal LVEF and no regional dysfunction  Mild tricuspid regurgitation with estimated mild hypertension and normal RV size/function      IMPRESSION/PLAN:  1.  Palpitations, frequent ventricular ectopy.  Minimally symptomatic currently on low-dose carvedilol which we will continue.  Prior stress testing showed no evidence of ischemia and today's echo shows no major structural/functional abnormalities of the heart.  No high risk clinical features such as angina or syncope.  We will continue her current beta-blocker.  2.  Dyslipidemia.  Last LDL good at 64. Continue statin.    We will plan for follow-up in one year.      09/12/2021 addendum:    In regards to upcoming dental procedure patient is low risk from a cardiac standpoint.  There is no contraindication in the use  of Lortab, Ibuprofen or  Amoxicillan.       If we can be of any further assistance please do not hesitate to call our office.    Leonides Grills NP

## 2021-09-11 ENCOUNTER — Encounter: Payer: Self-pay | Admitting: Gastroenterology

## 2021-09-12 ENCOUNTER — Telehealth: Payer: Self-pay

## 2021-09-12 NOTE — Telephone Encounter (Signed)
Addend 08/2021 note sent  to [facility] Oral surgery    Leonides Grills

## 2021-09-12 NOTE — Telephone Encounter (Signed)
CLEARANCE FORM:  Surgeon/Office: [facility]  Type of surgery/procedure needed:  Tooth Extraction  Procedure date: TBD  Duration of procedure: 1 hr   Type of anesthesia: Local       Last visit at [facility]: 09/06/2021  Medications Recommendations?  They plan to prescribe Lortab, Ibuprofen, Amoxicillan  Phone: [phone]   Fax: [phone]

## 2021-09-24 ENCOUNTER — Ambulatory Visit: Payer: Medicare (Managed Care)

## 2021-09-24 ENCOUNTER — Other Ambulatory Visit: Payer: Self-pay

## 2021-09-24 VITALS — BP 139/69 | HR 50 | Temp 98.4°F | Resp 17

## 2021-09-24 DIAGNOSIS — L249 Irritant contact dermatitis, unspecified cause: Secondary | ICD-10-CM | POA: Insufficient documentation

## 2021-09-24 MED ORDER — PREDNISONE 10 MG PO TABS *I*
ORAL_TABLET | ORAL | 0 refills | Status: AC
Start: 2021-09-24 — End: 2021-10-09

## 2021-09-24 NOTE — [facility] Provider Note (Signed)
History     Chief Complaint   Patient presents with   ? Rash     Per pt she had rash that started on face, then spread to hips. Pt denied any new foods, life style changes, detergents, or soaps. Onset Monday. Pt using home remedy and benadryl for symptoms.     71 year old female with past medical history as listed below who presents today for rash.  Patient reports that 1 week ago she developed a rash on her face that has since spread to her abdomen/groin area.  Patient reports that the rash itches.  She has been using over-the-counter p.o. and topical Benadryl with minimal relief.  No changes in detergents or soaps.  Does not believe that she came in contact with poison ivy.  No fevers.          Medical/Surgical/Family History     Past Medical History:   Diagnosis Date   ? Anxiety    ? Arthritis    ? Cancer    ? Depression    ? Ductal carcinoma in situ (DCIS) of left breast    ? Hypothyroidism         Patient Active Problem List   Diagnosis Code   ? Medial meniscus tear S83.249A   ? 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 Atypical hyperplasia of left breast N60.92   ? Ductal carcinoma in situ (DCIS) of left breast D05.12   ? Encounter to discuss breast reconstruction Z71.89   ? S/P breast reconstruction, bilateral Z98.890   ? Palpitations R00.2            Past Surgical History:   Procedure Laterality Date   ? KNEE CARTILAGE SURGERY Left 2014   ? KNEE SURGERY Left    ? PR BX/EXC LYMPH NODE OPEN DEEP AXILLARY NODE Left 03/28/2017    Procedure: AXILLARY LYMPH NODE BIOPSY OR DISSECTION;  Surgeon: Sheliah Mends, MD;  [facility];  Service: Oncology General   ? PR EXC BREAST LES PREOP PLMT RAD MARKER OPEN 1 LES Left 02/28/2017    Procedure: LEFT BREAST BIOPSY WITH NEEDLE LOCALIZATION  ;  Surgeon: Sheliah Mends, MD;  [facility];  Service: Oncology General   ? PR INSERTION BREAST IMPLANT SAME DAY OF MASTECTOMY Bilateral 03/28/2017    Procedure: BILATERAL IMMEDIATE RECONSTRUCTION OF BREASTS WITH TISSUE  EXPANDER INSERTION AND ACELLULAR DERMAL MATRIX;  Surgeon: Corrie Dandy, MD;  Location: [facility];  Service: Plastics   ? PR INSERTION BREAST IMPLANT SAME DAY OF MASTECTOMY Bilateral 07/25/2017    Procedure: Bilateral Tissue Exapnder Removal with Bilateral Implant Insertion;  Surgeon: Corrie Dandy, MD;  Location: [facility];  Service: Plastics   ? PR MASTECTOMY SIMPLE COMPLETE Bilateral 03/28/2017    Procedure: BILATERAL MASTECTOMY;  Surgeon: Sheliah Mends, MD;  Location: [facility];  Service: Oncology General   ? TONSILLECTOMY       Family History   Problem Relation Age of Onset   ? Lung cancer Father    ? Cancer Father         Lung   ? Breast cancer Sister         half sister   ? Cancer Sister 65        breast   ? Rectal cancer Brother         half brother   ? Alzheimer's disease Mother    ? High Blood Pressure Mother    ? No Known Problems Son    ?  Thyroid disease Sister    ? Cancer Sister         Breast   ? Cancer Brother         Prostate   ? COPD Brother    ? No Known Problems Son    ? No Known Problems Son    ? Diabetes Neg Hx           Social History     Tobacco Use   ? Smoking status: Never   ? Smokeless tobacco: Never   Vaping Use   ? Vaping Use: Never used   Substance Use Topics   ? Alcohol use: Yes     Alcohol/week: 7.0 standard drinks of alcohol     Types: 7 Glasses of wine per week   ? Drug use: No     Living Situation     Questions Responses    Patient lives with     Homeless     Caregiver for other family member     External Services     Employment     Domestic Violence Risk                 Review of Systems   Review of Systems   Constitutional: Negative for fever.   Skin: Positive for rash. Negative for wound.       Physical Exam   Vitals     First Recorded BP: 139/69, Resp: 17, Temp: 36.9 ?C (98.4 ?F), Temp src: TEMPORAL Oxygen Therapy SpO2: 98 %, Heart Rate: 50, (09/24/21 1424)  .      Physical Exam  Vitals and nursing note reviewed.   Constitutional:       General: She is not in  acute distress.     Appearance: Normal appearance. She is not ill-appearing, toxic-appearing or diaphoretic.   HENT:      Head: Normocephalic and atraumatic.   Eyes:      Extraocular Movements: Extraocular movements intact.   Cardiovascular:      Rate and Rhythm: Normal rate.   Pulmonary:      Effort: Pulmonary effort is normal. No respiratory distress.   Musculoskeletal:         General: Normal range of motion.      Cervical back: Normal range of motion and neck supple.   Skin:     General: Skin is warm and dry.      Findings: Rash present. Rash is macular and urticarial.          Neurological:      Mental Status: She is alert and oriented to person, place, and time.      GCS: GCS eye subscore is 4. GCS verbal subscore is 5. GCS motor subscore is 6.   Psychiatric:         Mood and Affect: Mood normal.         Behavior: Behavior normal. Behavior is cooperative.          Medical Decision Making     Assessment:  71 y.o., female comes to the Urgent Care for evaluation of pruritic and diffusely spreading rash.  Vital signs stable.  Patient is nontoxic-appearing, afebrile.  Respirations regular and unlabored.    Differential diagnosis:   Contact dermatitis  Cellulitis less likely    Plan:   Orders Placed This Encounter   ? predniSONE (DELTASONE) 10 mg tablet          Course & disposition: Patient was given a prescription for a prednisone  taper for symptom management and was encouraged to use over-the-counter therapies to aid in symptom management.  Close follow-up with PCP.  Patient was discharged to home.  Patient was given return precautions and follow-up instructions, which they verbalized understanding of.      This note was dictated using Network engineer.  A reasonable attempt at proof reading has been made to minimize errors.    Final Diagnosis    ICD-10-CM ICD-9-CM   1. Irritant contact dermatitis, unspecified trigger  L24.9 692.9         Yancey Flemings, NP

## 2021-09-24 NOTE — Patient Instructions (Signed)
You were seen and evaluated today at urgent care for a rash.  You were given a prescription for steroids, take as directed.  Cool compress to the areas of irritation.  You can use topical benadryl or calamine lotion as directed for itching/irritation.    Please follow-up with your primary care doctor as needed.    If you develop any fevers, difficulty breathing or if the rash continues to spread despite steroids, please be reevaluated sooner.

## 2021-09-25 ENCOUNTER — Telehealth: Payer: Medicare (Managed Care) | Admitting: Cardiology

## 2021-09-26 ENCOUNTER — Other Ambulatory Visit: Payer: Self-pay | Admitting: Cardiology

## 2021-09-26 MED ORDER — METOPROLOL TARTRATE 25 MG PO TABS *I*
12.5000 mg | ORAL_TABLET | Freq: Two times a day (BID) | ORAL | 1 refills | Status: DC
Start: 2021-09-26 — End: 2021-11-14

## 2021-09-29 ENCOUNTER — Telehealth: Payer: Medicare (Managed Care) | Admitting: Cardiology

## 2021-10-03 ENCOUNTER — Encounter: Payer: Self-pay | Admitting: Cardiology

## 2021-10-19 ENCOUNTER — Other Ambulatory Visit
Admission: RE | Admit: 2021-10-19 | Discharge: 2021-10-19 | Disposition: A | Discharge status: Home / Self Care | Payer: Medicare (Managed Care) | Source: Ambulatory Visit | Attending: Internal Medicine | Admitting: Internal Medicine

## 2021-10-19 DIAGNOSIS — E039 Hypothyroidism, unspecified: Secondary | ICD-10-CM | POA: Insufficient documentation

## 2021-10-19 LAB — COMPREHENSIVE METABOLIC PANEL
ALT: 24 U/L (ref 0–35)
AST: 19 U/L (ref 0–35)
Albumin: 4.1 g/dL (ref 3.5–5.2)
Alk Phos: 77 U/L (ref 35–105)
Anion Gap: 11 (ref 7–16)
Bilirubin,Total: 0.6 mg/dL (ref 0.0–1.2)
CO2: 24 mmol/L (ref 20–28)
Calcium: 8.7 mg/dL (ref 8.6–10.2)
Chloride: 106 mmol/L (ref 96–108)
Creatinine: 0.82 mg/dL (ref 0.51–0.95)
Glucose: 109 mg/dL — ABNORMAL HIGH (ref 60–99)
Lab: 15 mg/dL (ref 6–20)
Potassium: 4.6 mmol/L (ref 3.3–5.1)
Sodium: 141 mmol/L (ref 133–145)
Total Protein: 6.4 g/dL (ref 6.3–7.7)
eGFR BY CREAT: 76 *

## 2021-10-19 LAB — CBC AND DIFFERENTIAL
Baso # K/uL: 0 10*3/uL (ref 0.0–0.2)
Basophil %: 0.7 %
Eos # K/uL: 0.1 10*3/uL (ref 0.0–0.5)
Eosinophil %: 2 %
Hematocrit: 40 % (ref 34–49)
Hemoglobin: 13 g/dL (ref 11.2–16.0)
IMM Granulocytes #: 0 10*3/uL (ref 0.0–0.0)
IMM Granulocytes: 0.2 %
Lymph # K/uL: 1.1 10*3/uL (ref 1.0–5.0)
Lymphocyte %: 24.5 %
MCH: 32 pg (ref 26–32)
MCHC: 32 g/dL (ref 32–36)
MCV: 98 fL (ref 75–100)
Mono # K/uL: 0.5 10*3/uL (ref 0.1–1.0)
Monocyte %: 11.1 %
Neut # K/uL: 2.8 10*3/uL (ref 1.5–6.5)
Nucl RBC # K/uL: 0 10*3/uL (ref 0.0–0.0)
Nucl RBC %: 0 /100 WBC (ref 0.0–0.2)
Platelets: 248 10*3/uL (ref 150–450)
RBC: 4.1 MIL/uL (ref 4.0–5.5)
RDW: 13.2 % (ref 0.0–15.0)
Seg Neut %: 61.5 %
WBC: 4.6 10*3/uL (ref 3.5–11.0)

## 2021-10-19 LAB — TSH: TSH: 0.74 u[IU]/mL (ref 0.27–4.20)

## 2021-10-20 LAB — HEMOGLOBIN A1C: Hemoglobin A1C: 6.1 % — ABNORMAL HIGH

## 2021-11-07 ENCOUNTER — Encounter: Payer: Self-pay | Admitting: Cardiology

## 2021-11-07 NOTE — Telephone Encounter (Signed)
Spoke with patient on the phone about her concerns.  I noted that a very common side effect of metformin are GI symptoms with nausea or diarrhea, unfortunately these have continued since she discontinued metformin 1 week ago.  I reassured her that I do not think this is coming from metoprolol but to know for certain she may hold metoprolol for a few days to see if her GI symptoms resolve.  Also noted that a common side effect of bupropion is having strange dreams, she has lowered the dose and this has now improved.  Again I do not think this is from an atypical side effect of metoprolol.

## 2021-11-14 ENCOUNTER — Other Ambulatory Visit: Payer: Self-pay | Admitting: Cardiology

## 2021-11-14 MED ORDER — METOPROLOL TARTRATE 25 MG PO TABS *I*
12.5000 mg | ORAL_TABLET | Freq: Two times a day (BID) | ORAL | 3 refills | Status: DC
Start: 2021-11-14 — End: 2022-12-06

## 2021-11-14 NOTE — Progress Notes (Signed)
Refill

## 2022-03-13 ENCOUNTER — Telehealth: Payer: Self-pay | Admitting: Oncology

## 2022-03-13 ENCOUNTER — Telehealth: Payer: Self-pay

## 2022-03-13 NOTE — Telephone Encounter (Signed)
Calling to speak with the team in regards to the Pt appt on 04/17. Asking to see if the appt is going to be rescheduled or stay the same. Asking for a call back to [place] at [phone] to clarify.

## 2022-03-13 NOTE — Telephone Encounter (Signed)
LM to call me back directly about scheduling April follow up on a Monday/Thursday with NP Selena Batten or Lequita Halt. If interested in seeing a Surgeon for poss. Revision surgery I can schedule her for that appointment as well! Call back # provided.

## 2022-03-13 NOTE — Telephone Encounter (Signed)
Writer left a message for [employer] at [employer] at [employer]. We will be rescheduling this patients appointment. If there is any further questions please give [place] a call at [phone].

## 2022-04-26 ENCOUNTER — Other Ambulatory Visit
Admission: RE | Admit: 2022-04-26 | Discharge: 2022-04-26 | Disposition: A | Discharge status: Home / Self Care | Payer: Medicare (Managed Care) | Source: Ambulatory Visit | Attending: Internal Medicine | Admitting: Internal Medicine

## 2022-04-26 DIAGNOSIS — R7303 Prediabetes: Secondary | ICD-10-CM | POA: Insufficient documentation

## 2022-04-26 LAB — CBC AND DIFFERENTIAL
Baso # K/uL: 0.1 10*3/uL (ref 0.0–0.2)
Basophil %: 0.9 %
Eos # K/uL: 0.2 10*3/uL (ref 0.0–0.5)
Eosinophil %: 3.1 %
Hematocrit: 42 % (ref 34–49)
Hemoglobin: 13.7 g/dL (ref 11.2–16.0)
IMM Granulocytes #: 0 10*3/uL (ref 0.0–0.0)
IMM Granulocytes: 0.4 %
Lymph # K/uL: 1.4 10*3/uL (ref 1.0–5.0)
Lymphocyte %: 26.6 %
MCH: 32 pg (ref 26–32)
MCHC: 33 g/dL (ref 32–36)
MCV: 97 fL (ref 75–100)
Mono # K/uL: 0.8 10*3/uL (ref 0.1–1.0)
Monocyte %: 14.4 %
Neut # K/uL: 3 10*3/uL (ref 1.5–6.5)
Nucl RBC # K/uL: 0 10*3/uL (ref 0.0–0.0)
Nucl RBC %: 0 /100 WBC (ref 0.0–0.2)
Platelets: 243 10*3/uL (ref 150–450)
RBC: 4.3 MIL/uL (ref 4.0–5.5)
RDW: 13.3 % (ref 0.0–15.0)
Seg Neut %: 54.6 %
WBC: 5.4 10*3/uL (ref 3.5–11.0)

## 2022-04-26 LAB — HEMOGLOBIN A1C: Hemoglobin A1C: 6.1 % — ABNORMAL HIGH

## 2022-04-26 LAB — COMPREHENSIVE METABOLIC PANEL
ALT: 27 U/L (ref 0–35)
AST: 25 U/L (ref 0–35)
Albumin: 4.2 g/dL (ref 3.5–5.2)
Alk Phos: 74 U/L (ref 35–105)
Anion Gap: 10 (ref 7–16)
Bilirubin,Total: 0.3 mg/dL (ref 0.0–1.2)
CO2: 26 mmol/L (ref 20–28)
Calcium: 8.9 mg/dL (ref 8.6–10.2)
Chloride: 105 mmol/L (ref 96–108)
Creatinine: 0.93 mg/dL (ref 0.51–0.95)
Glucose: 105 mg/dL — ABNORMAL HIGH (ref 60–99)
Lab: 22 mg/dL — ABNORMAL HIGH (ref 6–20)
Potassium: 4.5 mmol/L (ref 3.3–5.1)
Sodium: 141 mmol/L (ref 133–145)
Total Protein: 6.4 g/dL (ref 6.3–7.7)
eGFR BY CREAT: 65 *

## 2022-04-26 LAB — VITAMIN D: 25-OH Vit Total: 46 ng/mL (ref 30–60)

## 2022-04-26 LAB — LIPID PANEL
Chol/HDL Ratio: 2
Cholesterol: 165 mg/dL
HDL: 83 mg/dL — ABNORMAL HIGH (ref 40–60)
LDL Calculated: 67 mg/dL
Non HDL Cholesterol: 82 mg/dL
Triglycerides: 73 mg/dL

## 2022-04-26 LAB — TSH: TSH: 1.87 u[IU]/mL (ref 0.27–4.20)

## 2022-04-30 ENCOUNTER — Encounter: Payer: Self-pay | Admitting: Oncology

## 2022-05-30 ENCOUNTER — Ambulatory Visit: Payer: Medicare (Managed Care) | Admitting: Surgery

## 2022-05-30 ENCOUNTER — Ambulatory Visit: Payer: Medicare (Managed Care) | Admitting: Oncology

## 2022-06-18 NOTE — Progress Notes (Signed)
[facility] ONCOLOGY FOLLOW-UP VISIT    [name] is a 72 y.o. 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 She presents today to the [facility] for routine follow-up.     ONCOLOGY HISTORY:      Oncology History   Ductal carcinoma in situ (DCIS) of left breast   02/28/2017 Surgery    Left excisional breast biopsy for atypia (Dr. Claris Gower)    Cancer Staging  Ductal carcinoma in situ (DCIS) of left breast  Staging form: Breast, AJCC 8th Edition  - Clinical stage from 02/28/2017: Stage Unknown (cTis (DCIS), cNX, cM0, ER: Positive, PR: Positive, HER2: Not assessed ) - Signed by Carmela Rima, NP on 03/08/2017  : Tumor size: 60mm.  ER: 99%, strong staining; PR: 99%, strong staining;  cribiform and micropapillary type, NG1 with focal, punctate necrosis; closest margin: posterior (<67mm), lateral (1mm) and medial (<76mm);     02/28/2017 Initial Diagnosis    Ductal carcinoma in situ (DCIS) of left breast     02/28/2017 Significant Lab Findings    Cancer Staging  Ductal carcinoma in situ (DCIS) of left breast  Staging form: Breast, AJCC 8th Edition  - Clinical stage from 02/28/2017: Stage Unknown (cTis (DCIS), cNX, cM0, ER: Positive, PR: Positive, HER2: Not assessed ) - Signed by Carmela Rima, NP on 03/08/2017  : ER: 99%, strong staining; PR: 99%, strong staining;   In situ component: 100%, cribiform type, NG1 with necrosis, 6cm. Closest margin: posterior <25mm.      03/28/2017 Surgery    Bilateral total mastectomy, left sentinel node biopsy, immediate tissue expander reconstruction- Sheliah Mends and Floyce Stakes.     03/28/2017 Significant Lab Findings    Cancer Staging  Ductal carcinoma in situ (DCIS) of left breast  Staging form: Breast, AJCC 8th Edition  - Clinical stage from 02/28/2017: Stage Unknown (cTis (DCIS), cNX, cM0, ER: Positive, PR: Positive, HER2: Not assessed ) - Signed by Carmela Rima, NP on 03/08/2017  - Pathologic stage from 04/07/2017: Stage 0 (pTis (DCIS), pN0(sn), cM0, ER: Positive, PR: Positive, HER2: Not assessed ) - Signed by Sheliah Mends, MD on 04/07/2017  : Tumor size: 90mm.  ER: 99%, strong staining; PR: 99%, strong staining;  Cribiform, micropapillary, papillary type, NG1 w  ith necrosis; without skin involvement, without nipple involvement, without muscle involvement. Closest margin: posterior ,1mm; 0/1 SLN/ALN, USC/VNPI: (Age score = 1) + (Path score = 1) + (Margin score = 3) + (Size score = 3) = 8, intermediate risk.          Cancer Staging   Ductal carcinoma in situ (DCIS) of left breast  Staging form: Breast, AJCC 8th Edition  - Clinical stage from 02/28/2017: Stage Unknown (cTis (DCIS), cNX, cM0, ER: Positive, PR: Positive, HER2: Not assessed ) - Signed by Carmela Rima, NP on 03/08/2017  - Pathologic stage from 04/07/2017: Stage 0 (pTis (DCIS), pN0(sn), cM0, ER: Positive, PR: Positive, HER2: Not assessed ) - Signed by Sheliah Mends, MD on 04/07/2017       INTERVAL HISTORY:     Patient presents alone today and reports the following:   New breast / chest wall concerns: No concerns relating to her breasts / chest wall including skin change, palpable mass, or tenderness.   Seeing Dr. Etheleen Mayhew next week to discuss implant removal - each of her implants has had a small outpouching form over time which is  bothersome    New physical concerns: none    New family cancer dx: no   Up to date with PCP: yes     Up to date with GYN: no longer follows    Physical activity: stationary bike every other day, dancing, strength training   Alcohol intake: glass of wine nightly with dinner   Follows with dermatology for routine skin checks     Denies any new headaches, vision changes or dizzy spells.   Denies chest pain, shortness of breath, or persistent cough. Had been having palpitations - improved with a new medication.   Denies any abdominal concerns, no nausea, vomiting,  diarrhea.   No vaginal bleeding or dryness. Not sexually active currently due to lack of interest as well as her husband's health history   No progressive bone pain.   Weight stable - trying to lose.     She is retired and lives with husband.     Social and family history reviewed, see below.     REVIEW OF SYSTEMS:      The pertinent positives and negatives are mentioned in the HPI. The remainder of the comprehensive 10 point review of systems was negative.       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    Social History     Socioeconomic History    Marital status: Married   Tobacco Use    Smoking status: Never    Smokeless tobacco: Never   Vaping Use    Vaping status: Never Used   Substance and Sexual Activity    Alcohol use: Yes     Alcohol/week: 7.0 standard drinks of alcohol     Types: 7 Glasses of wine per week    Drug use: No       CURRENT MEDICATIONS:      Current Outpatient Medications   Medication    metoprolol tartrate (LOPRESSOR) 25 mg tablet    buPROPion (WELLBUTRIN SR) 200 mg 12 hr tablet    atorvastatin (LIPITOR) 20 mg tablet    levothyroxine (SYNTHROID, LEVOTHROID) 112 mcg tablet    fluticasone (FLONASE) 50 MCG/ACT nasal spray     No current facility-administered medications for this visit.       PHYSICAL EXAM:      Vitals:    06/19/22 1305   BP: 125/80   Pulse: 84   Temp: 37 C (98.6 F)   TempSrc: Temporal   SpO2: 95%   Weight: 80.8 kg (178 lb 2.1 oz)     Pain     06/19/22 1305   PainSc:   0 - No pain     Wt Readings from Last 3 Encounters:   06/19/22 80.8 kg (178 lb 2.1 oz)   09/06/21 79.3 kg (174 lb 12.8 oz)   09/06/21 79.3 kg (174 lb 12.8 oz)       General appearance: Alert, oriented, no acute distress  Neurologic: No focal deficits   Psych: Appropriate affect   HEENT: Normocephalic, anicteric sclera  Neck: No cervical adenopathy   Lungs: CTA bilaterally, respirations non labored on room air   Heart: RRR  Chest: Bilateral reconstructed breasts without palpable or visible lumps, bumps, or nodules; skin uniform. Right and left implants both with small outpouching just lateral to center of implants   No axillary, supraclavicular, or subclavicular lymphadenopathy.   Abdomen: Soft, nontender, nondistended  Back: No tenderness on palpation of the spine   Extremities: No edema   Skin: Warm and dry     IMAGING:     n/a     IMPRESSION/PLAN:     1. Ductal carcinoma in situ (DCIS) of left breast    She is without evidence of disease recurrence.     Plan:   Routine breast imaging for screening purposes not needed in the setting of bilateral mastectomy   We discussed being breast aware and to call with any new concerns   Reviewed recommendations for healthy lifestyle -- physical activity, healthy diet, avoidance of smoking, limitation of alcohol use, sunscreen use    Follow up as scheduled with plastic surgery, Dr. Etheleen Mayhew, 06/29/22     Follow up with survivorship oncology in one year. She was encouraged to call with any questions that may arise in the interim.     I personally spent 26 minutes on the calendar day of encounter, including pre and post visit work.    Leafy Kindle, PA-C   [facility]  [facility]  [address]  [place] [address]

## 2022-06-19 ENCOUNTER — Ambulatory Visit: Payer: Medicare (Managed Care) | Attending: Oncology | Admitting: Oncology

## 2022-06-19 ENCOUNTER — Other Ambulatory Visit: Payer: Self-pay

## 2022-06-19 VITALS — BP 125/80 | HR 84 | Temp 98.6°F | Wt 178.1 lb

## 2022-06-19 DIAGNOSIS — D0512 Intraductal carcinoma in situ of left breast: Secondary | ICD-10-CM | POA: Insufficient documentation

## 2022-06-29 ENCOUNTER — Ambulatory Visit: Payer: Medicare (Managed Care) | Admitting: Oncology

## 2022-06-29 ENCOUNTER — Ambulatory Visit: Payer: Medicare (Managed Care)

## 2022-07-06 ENCOUNTER — Ambulatory Visit: Payer: Medicare (Managed Care) | Admitting: Oncology

## 2022-07-13 ENCOUNTER — Other Ambulatory Visit: Payer: Self-pay

## 2022-07-13 ENCOUNTER — Ambulatory Visit: Payer: Medicare (Managed Care)

## 2022-07-13 VITALS — BP 135/77 | HR 80 | Temp 96.8°F | Wt 178.0 lb

## 2022-07-13 DIAGNOSIS — Z9889 Other specified postprocedural states: Secondary | ICD-10-CM

## 2022-07-13 DIAGNOSIS — N650 Deformity of reconstructed breast: Secondary | ICD-10-CM

## 2022-07-13 NOTE — H&P (Signed)
Plastic and  Reconstructive Surgery History and Physical    Chief Complaint: "I think I want my implants out"    History of Present Illness    Today I had the pleasure of seeing [name] who presents to discuss her bilateral breast reconstruction. She had bilateral sub-pectoral silicone implants (SCM 600s) placed by Dr. Gregery Na in June 2019. She dislikes the contour irregularity on each breast inferior to her incision. She's thinking she'd like her implants out, as she finds them heavy and uncomfortable. She's due for surveillance imaging this year.      PMHx:  Past Medical History:   Diagnosis Date    Anxiety     Arthritis     Cancer     Depression     Ductal carcinoma in situ (DCIS) of left breast     Hypothyroidism        PSHx:  Past Surgical History:   Procedure Laterality Date    KNEE CARTILAGE SURGERY Left 2014    KNEE SURGERY Left     PR BX/EXC LYMPH NODE OPEN DEEP AXILLARY NODE Left 03/28/2017    Procedure: AXILLARY LYMPH NODE BIOPSY OR DISSECTION;  Surgeon: Sheliah Mends, MD;  Location: [facility] MAIN OR;  Service: Oncology General    PR EXC BREAST LES PREOP PLMT RAD MARKER OPEN 1 LES Left 02/28/2017    Procedure: LEFT BREAST BIOPSY WITH NEEDLE LOCALIZATION  ;  Surgeon: Sheliah Mends, MD;  Location: [facility];  Service: Oncology General    PR INSERTION BREAST IMPLANT SAME DAY OF MASTECTOMY Bilateral 03/28/2017    Procedure: BILATERAL IMMEDIATE RECONSTRUCTION OF BREASTS WITH TISSUE EXPANDER INSERTION AND ACELLULAR DERMAL MATRIX;  Surgeon: Corrie Dandy, MD;  Location: [facility];  Service: Plastics    PR INSERTION BREAST IMPLANT SAME DAY OF MASTECTOMY Bilateral 07/25/2017    Procedure: Bilateral Tissue Exapnder Removal with Bilateral Implant Insertion;  Surgeon: Corrie Dandy, MD;  Location: [facility];  Service: Plastics    PR MASTECTOMY SIMPLE COMPLETE Bilateral 03/28/2017    Procedure: BILATERAL MASTECTOMY;  Surgeon: Sheliah Mends, MD;  Location: [facility];  Service: Oncology  General    TONSILLECTOMY         Allergies:  No Known Allergies (drug, envir, food or latex)    Social History:  Social History     Socioeconomic History    Marital status: Married   Tobacco Use    Smoking status: Never    Smokeless tobacco: Never   Vaping Use    Vaping status: Never Used   Substance and Sexual Activity    Alcohol use: Yes     Alcohol/week: 7.0 standard drinks of alcohol     Types: 7 Glasses of wine per week    Drug use: No         Medications:    Current Outpatient Medications:     metFORMIN 500 mg 24 hr tablet, Take 1 tablet (500 mg total) by mouth 2 times daily., Disp: , Rfl:     metoprolol tartrate (LOPRESSOR) 25 mg tablet, Take 0.5 tablets (12.5 mg total) by mouth 2 times daily, Disp: 90 tablet, Rfl: 3    buPROPion (WELLBUTRIN SR) 200 mg 12 hr tablet, Take 1 tablet (200 mg total) by mouth daily., Disp: , Rfl:     atorvastatin (LIPITOR) 20 mg tablet, Take 1 tablet (20 mg total) by mouth daily., Disp: , Rfl:     levothyroxine (SYNTHROID, LEVOTHROID) 112 mcg tablet, Take 1 tablet (  112 mcg total) by mouth daily., Disp: , Rfl:     fluticasone (FLONASE) 50 MCG/ACT nasal spray, Spray 1 spray into nostril daily as needed for Rhinitis., Disp: , Rfl:     Physical Exam  Vital signs: BP 135/77   Pulse 80   Temp 36 C (96.8 F) (Infrared)   Wt 80.7 kg (178 lb)   SpO2 96%   BMI 34.76 kg/m   General appearance: Alert, well appearing, and in no distress.   Heart: regular rate   Lungs: unlabored respiration on room air  Focused detailed exam of bilateral reconstructed breasts: Transverse incisions well healed. Implants soft and in good position bilaterally. There is a small, reducible contour irregularity at the inferior aspect of each breast consistent with a wrinkle or ripple of her implant. No overlying skin changes, no masses palpable     Imaging: n/a    Labs: last hemoglobin A1c was 6.1% in March 2024    Assessment and Recommendations  72 y.o. female who presents today to establish care with Dr.  Etheleen Mayhew, as she is a former patient of Dr. Rose Fillers.     We discussed we would recommend first obtaining her surveillance imaging with an ultrasound to ensure her implants are intact. If they are not, she could choose to replace them, or choose to remove them. She could also choose to remove them if the [place] does not show a rupture.    We discussed the area of contour irregularity she dislikes seems to be a wrinkle of her implant on exam today, not a standing cutaneous deformity. The correction for this would require a revision of her ADM, with potential for capsule work, skin revision, and replacement of her implant for a more cohesive overfilled version to help reduce the chances of visible wrinkles/ripples.    She'll take some time to consider her options, and obtain her [place] in the mean time. We'll plan to see her back in the office to review results once complete and discuss potential surgical interventions if desired.    Seen and evaluated with Dr. Etheleen Mayhew.      Memory Argue, NP  Plastic and Reconstructive Surgery     I saw and evaluated the patient with the Nurse Practitioner. I agree with the nurse practitioner's findings and plan of care as documented above.    Lorenso Courier, MD

## 2022-08-07 DIAGNOSIS — Z9889 Other specified postprocedural states: Secondary | ICD-10-CM

## 2022-08-15 ENCOUNTER — Telehealth: Payer: Self-pay

## 2022-08-15 NOTE — Telephone Encounter (Signed)
Called patient to discuss her questions about ultrasound for her implants.  We reviewed that on exam, the areas of protrusion she was noting were consistent with wrinkles of the implant.  We had initially discussed ultrasound, but after her visit decided she would just like the implants out.  We discussed she would not need the ultrasound if we are planning to remove as it will not change the plan and there were no concerning findings on her exam.  Her husband will need surgery in the upcoming months so we will see her again when she is ready for surgery and submit prior authorization to her insurance.    Lorenso Courier, MD   Plastic and Reconstructive Surgery

## 2022-09-06 ENCOUNTER — Encounter: Payer: Self-pay | Admitting: Cardiology

## 2022-09-11 ENCOUNTER — Ambulatory Visit: Payer: Medicare (Managed Care) | Admitting: Cardiology

## 2022-10-25 IMAGING — MG 3D SCREENING MAMMO BIL AND TOMO
5 series · 8 of 24 positions shown · non-contrast
Comparison: 11/14/2022, 11/11/2021.

------------- REPORT [id number] -------------
﻿

EXAM:  3D SCREENING MAMMO BIL AND TOMO
INDICATION: Screening mammogram.  Asymptomatic 72-year-old with no family history.  Lifetime breast cancer risk 4.3%.

[R]
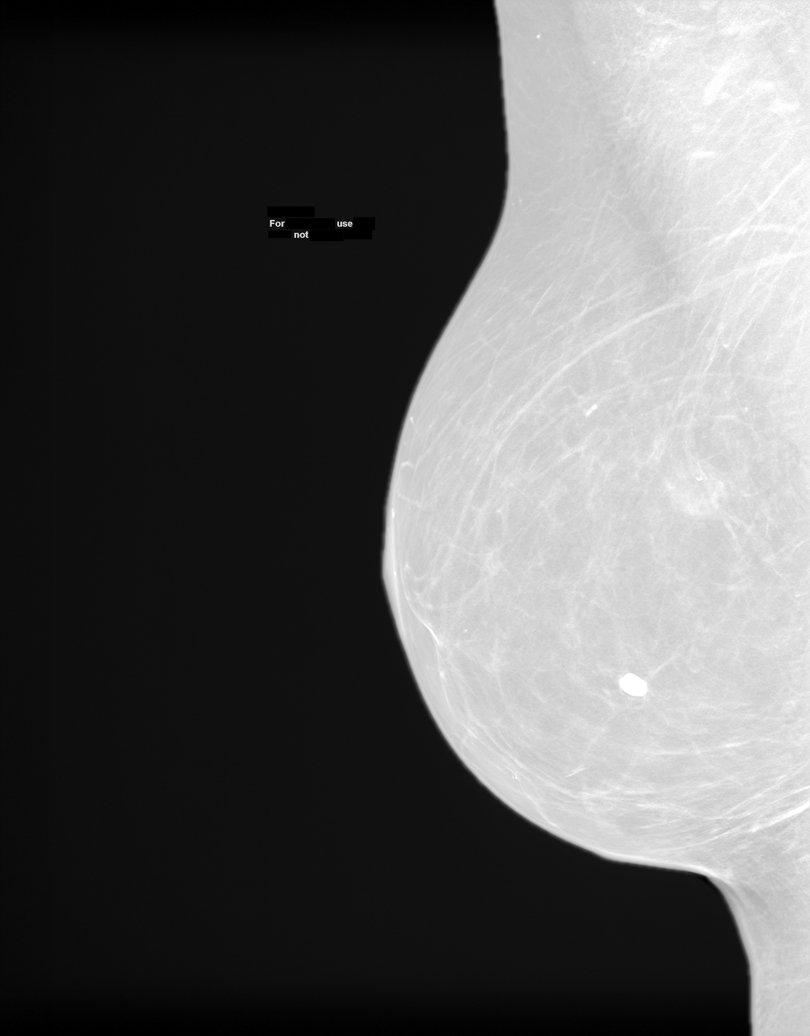

[L]
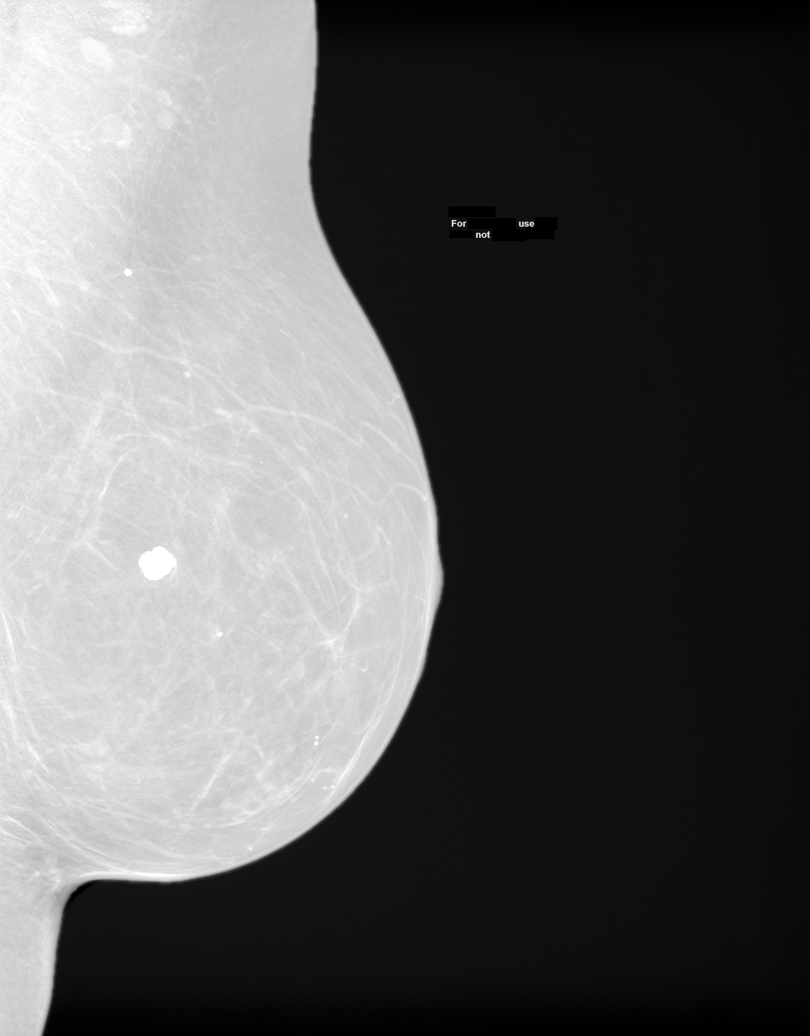

[R CC tomo · right · 0.10mm/px · 2 of 4 slices shown]
[im 1/4]
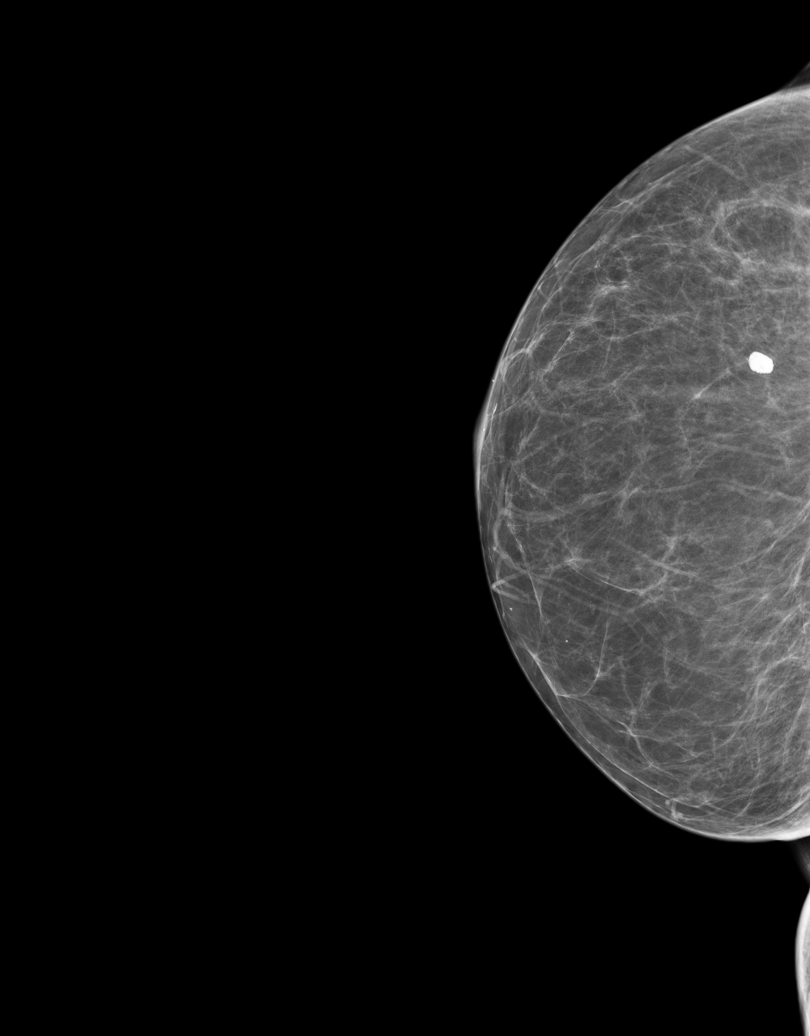
[im 4/4]
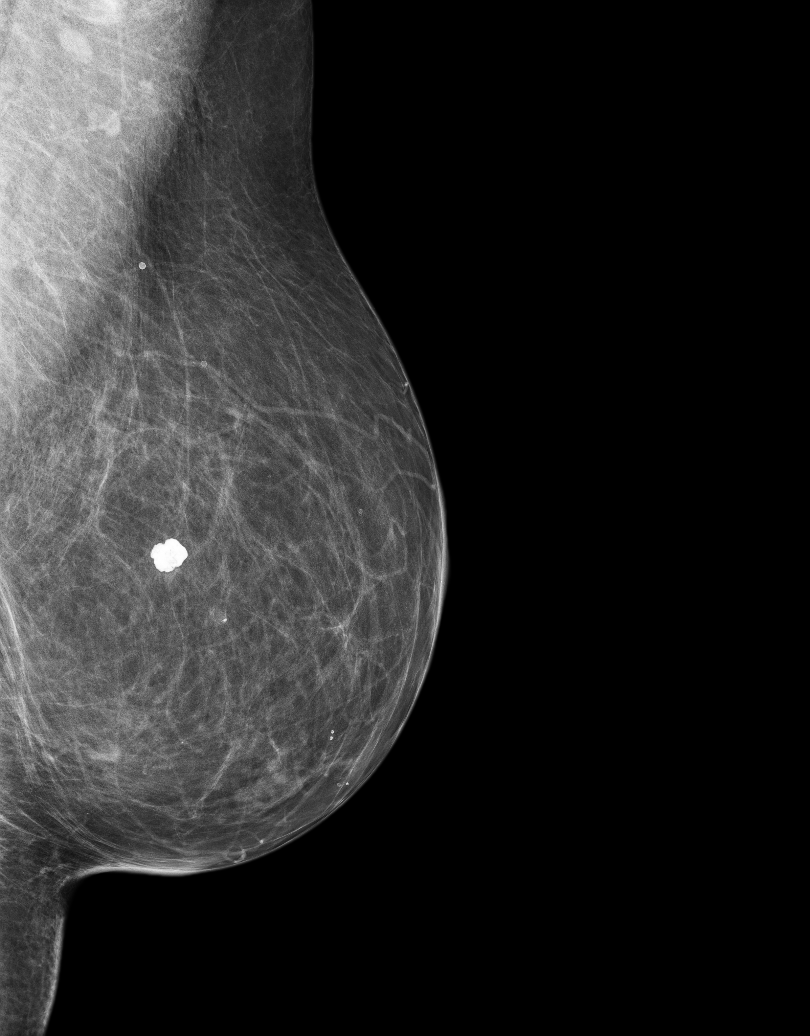

[3D SCREENING MAMMO BIL AND TOMO tomo · 2 acquisitions, 3 frames shown (1 of 2)]
[im 1/2]
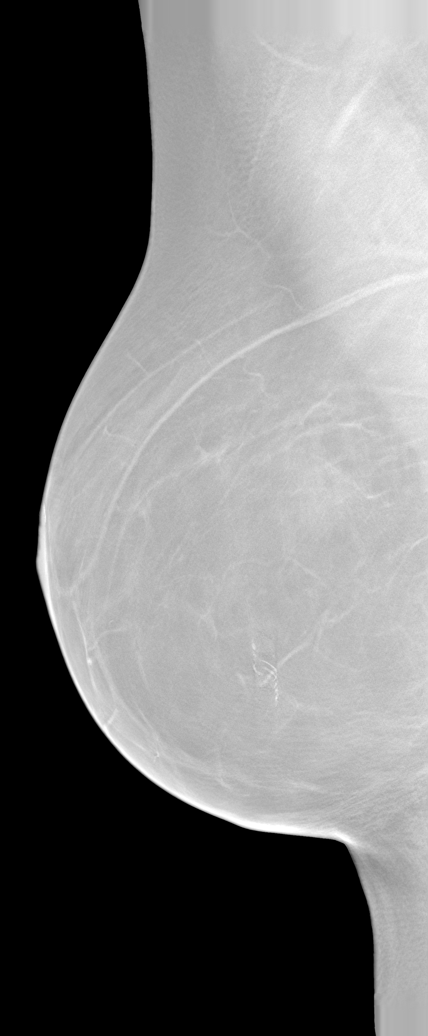
[im 2/2]
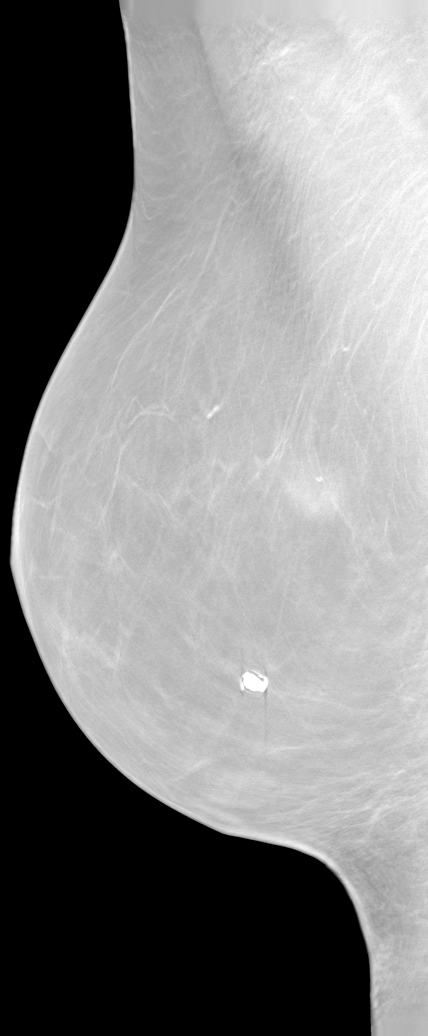
[im 2/2]
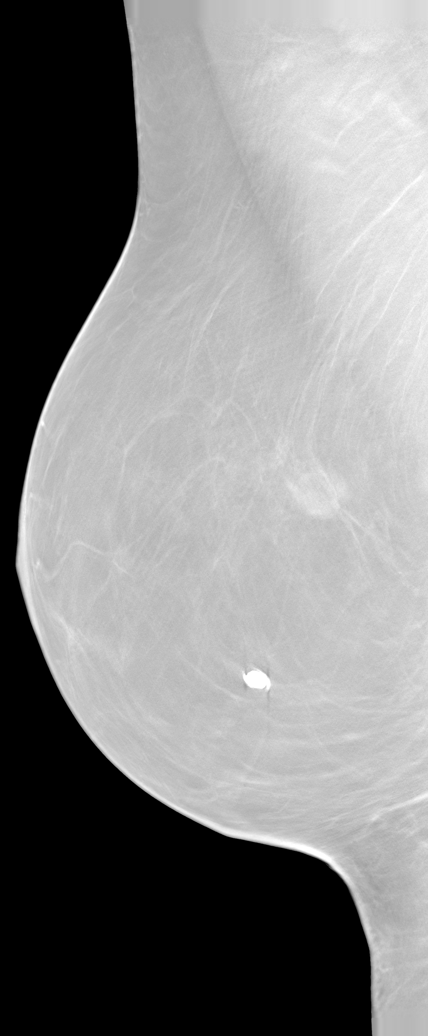

[3D SCREENING MAMMO BIL AND TOMO tomo (2 of 2) · tomo slice 11/69.0]
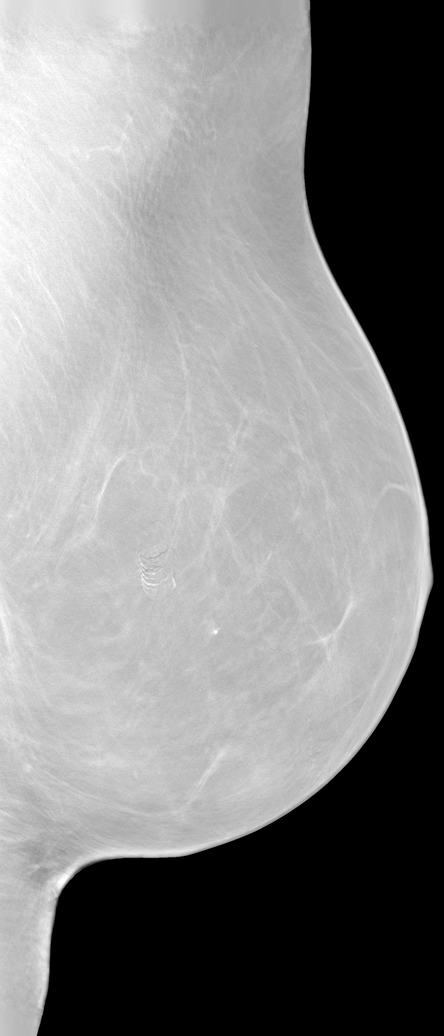

[8 of 24 positions shown; findings below may reference images not displayed]

FINDINGS: There are scattered fibroglandular elements.  There is no mass or suspicious cluster of microcalcifications.   There is no architectural distortion, skin thickening or nipple retraction.
IMPRESSION: 1.  BIRADS 2-Benign findings. Patient has been added in a reminder system with a target date for the next screening mammography.

2.  DENSITY CODE –  B (Scattered areas of fibroglandular density) 

Final Assessment Code:

BI-RADS 0
 Need additional imaging evaluation.

BI-RADS 1
 Negative mammogram.

BI-RADS 2
 Benign finding.

BI-RADS 3
 Probably benign finding; short-interval follow-up suggested.

BI-RADS 4
 Suspicious abnormality; biopsy should be considered.

BI-RADS 5
 Highly suggestive of malignancy; appropriate action should be taken.

BI-RADS 6
 Known biopsy-proven malignancy; appropriate action should be taken.

NOTE:
In compliance with Federal regulations, the results of this mammogram are being sent to the patient.

------------- REPORT [id number] -------------
[facility]
[address]
We wish to report the following on your recent mammography examination. We are sending a report to your referring physician or other health care provider.
FINDING: Normal-no evidence of cancer

This statement is mandated by the [place], Department of Health.
Your examination was performed by one of our technologists, who are registered radiological technologists and also specially certified in mammography:
___
[place], [place] (M)

Your mammogram was interpreted by our radiologist.

( 
Amore Pho, M.D.

(Annual Breast Examination by a physician or other health care provider
(Annual Mammography Screening beginning at age 40
(Monthly Breast Self Examination

## 2022-10-29 ENCOUNTER — Other Ambulatory Visit: Payer: Self-pay | Admitting: Gastroenterology

## 2022-11-19 ENCOUNTER — Ambulatory Visit: Payer: Medicare (Managed Care) | Admitting: Cardiology

## 2022-11-19 ENCOUNTER — Encounter: Payer: Self-pay | Admitting: Cardiology

## 2022-11-19 ENCOUNTER — Other Ambulatory Visit: Payer: Self-pay

## 2022-11-19 VITALS — BP 138/70 | HR 73 | Resp 16 | Ht 61.0 in | Wt 179.0 lb

## 2022-11-19 DIAGNOSIS — R002 Palpitations: Secondary | ICD-10-CM

## 2022-11-19 NOTE — Progress Notes (Signed)
CARDIOLOGY FOLLOWUP     I had the pleasure of seeing [name] in followup for her palpitations/PVCs.  She says she is feeling well now.  She notes an occasional skipped heartbeat but is not bothered by the sense of her heart racing or fluttering for any length of time.  No syncope or presyncope.  No chest pain or dyspnea.  She does get some swelling in her legs at the end of the day.  She has been exercising at the [facility].Marland Kitchen    Allergies, PMFS, & ROS were reviewed and updated. Pertinent details as above.     MEDICATIONS:   Current Outpatient Medications   Medication Sig    omeprazole 20 mg capsule Take 1 capsule (20 mg total) by mouth as needed.    metoprolol tartrate (LOPRESSOR) 25 mg tablet Take 0.5 tablets (12.5 mg total) by mouth 2 times daily    buPROPion (WELLBUTRIN SR) 200 mg 12 hr tablet Take 1 tablet (200 mg total) by mouth daily.    atorvastatin (LIPITOR) 20 mg tablet Take 1 tablet (20 mg total) by mouth daily.    levothyroxine (SYNTHROID, LEVOTHROID) 112 mcg tablet Take 1 tablet (112 mcg total) by mouth daily.    fluticasone (FLONASE) 50 MCG/ACT nasal spray Spray 1 spray into nostril daily as needed for Rhinitis.     No current facility-administered medications for this visit.       PHYSICAL EXAM:  BP 138/70   Pulse 73   Resp 16   Ht 1.549 m (5\' 1" )   Wt 81.2 kg (179 lb)   SpO2 95%   BMI 33.82 kg/m . GEN: Pleasant, well-appearing, no acute distress. HEENT: Sclerae clear, mucous membranes moist. LUNGS: clear to ausculation bilaterally, no wheezes or crackles. CV: Regular rate and rhythm, normal S1 and S2, no S3 or S4, no murmur, no rub. JVP not elevated. No carotid bruits.  Ext: Trace bilateral nonpitting edema, intact pedal pulses bilaterally.      LABS/DATA:       Lab results: 04/26/22  0840   Sodium 141   Potassium 4.5   Chloride 105   CO2 26   UN 22*   Creatinine 0.93   Glucose 105*   Calcium 8.9         Lab results: 04/26/22  0840   WBC 5.4   Hemoglobin 13.7   Hematocrit 42   RBC 4.3    Platelets 243         Lab results: 04/26/22  0840   Cholesterol 165   HDL 83*   Triglycerides 73   LDL Calculated 67   No results for input(s): "PROBNP" in the last 8760 hours.       ECHO COMPLETE 09/06/2021    Interpretation Summary  LV chamber size with normal LVEF and no regional dysfunction  Mild tricuspid regurgitation with estimated mild hypertension and normal RV size/function      IMPRESSION/PLAN:  1.  Palpitations, PVCs.  Symptoms currently well-controlled with low-dose metoprolol tartrate 12.5 mg twice daily which we will continue.  2.  Dyslipidemia.  Last lipid panel looked good with LDL of 67.  Continue statin.  3.  Diabetes.  She had some GI side effects from metformin and is trying to manage her diabetes with diet and exercise.  I did [facility]    We will plan for follow-up in 1 year.

## 2022-12-05 ENCOUNTER — Other Ambulatory Visit: Payer: Self-pay | Admitting: Gastroenterology

## 2022-12-06 ENCOUNTER — Other Ambulatory Visit: Payer: Self-pay | Admitting: Cardiology

## 2022-12-06 MED ORDER — METOPROLOL TARTRATE 25 MG PO TABS *I*
12.5000 mg | ORAL_TABLET | Freq: Two times a day (BID) | ORAL | 3 refills | Status: DC
Start: 2022-12-06 — End: 2023-12-04

## 2022-12-14 IMAGING — MR MRI LUMBAR SPINE WITHOUT CONTRAST
4 of 7 series · 28 of 48 positions shown · IV contrast (gadolinium)
Comparison: Lumbar spine x-ray 07/28/2019.

﻿EXAM:  33190   MRI LUMBAR SPINE WITHOUT CONTRAST
INDICATION: Chronic low back pain.  Radicular symptoms to the left leg.  Multiple injuries due to falls.  No history of malignancy.  History of back surgery several years ago.
TECHNIQUE: Multiplanar, multisequential MRI of the lumbosacral spine was performed without gadolinium contrast.

[Series 5: T2 · sagittal · 4.5mm · 0.94mm/px · 6 of 13 slices shown (1 of 3)]
[im 1/13]
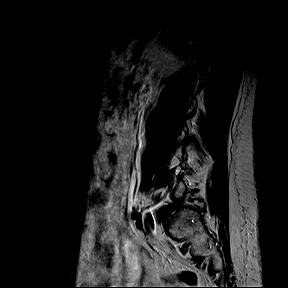
[im 3/13]
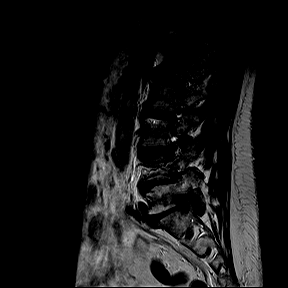
[im 5/13]
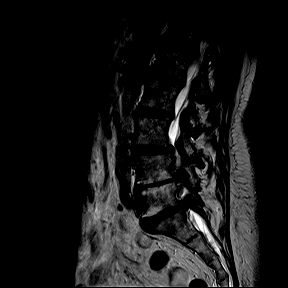
[im 8/13]
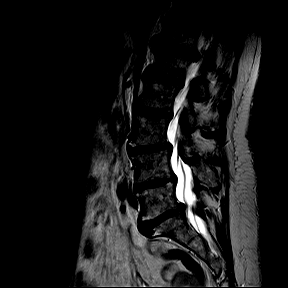
[im 10/13]
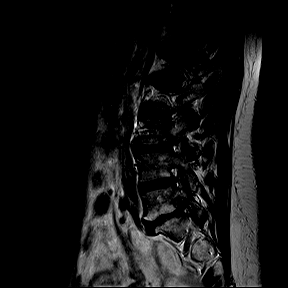
[im 13/13]
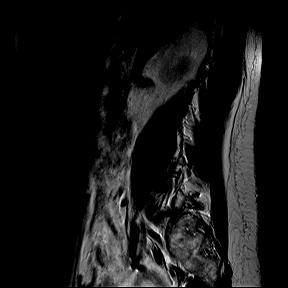

[Series 6: T1 · sagittal · 4.5mm · 0.94mm/px · 4 of 13 slices shown]
[im 1/13]
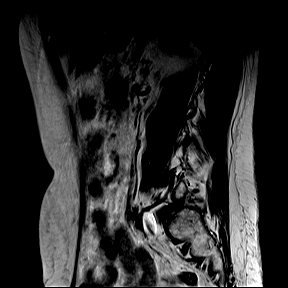
[im 3/13]
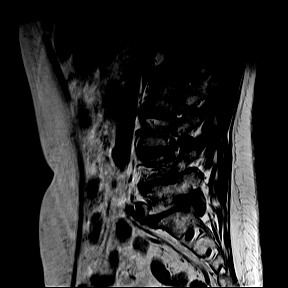
[im 7/13]
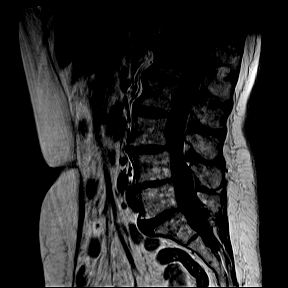
[im 11/13]
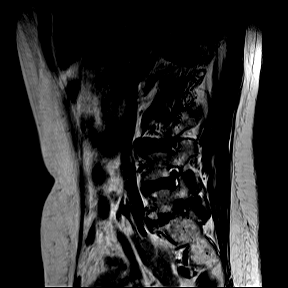

[Series 8: T2 · coronal · 5.0mm · 0.82mm/px · 9 of 18 slices shown (2 of 3)]
[im 1/18]
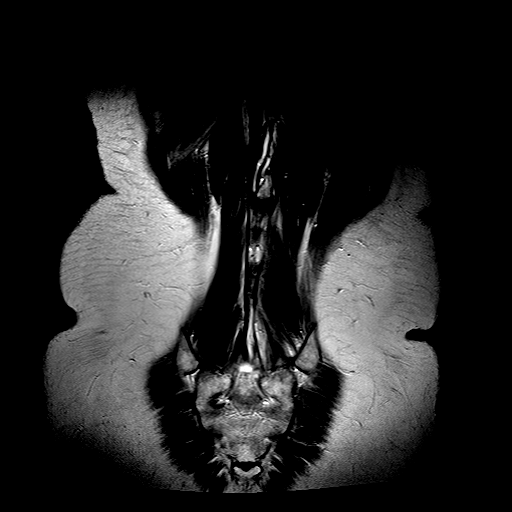
[im 3/18]
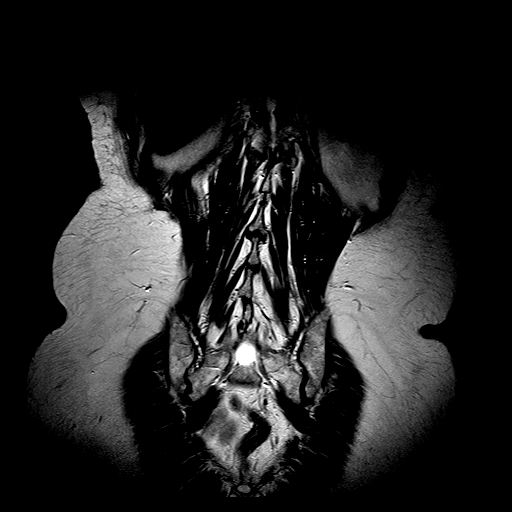
[im 5/18]
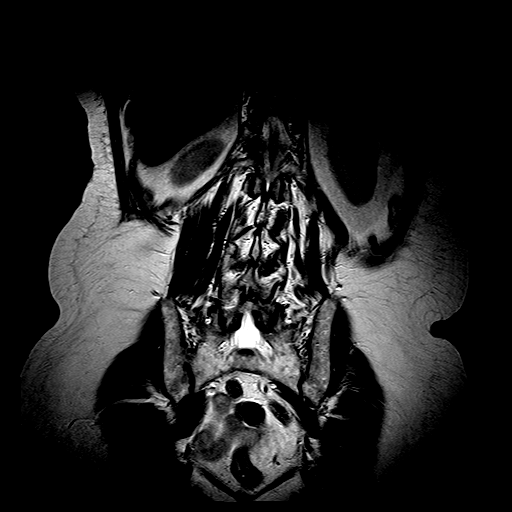
[im 7/18]
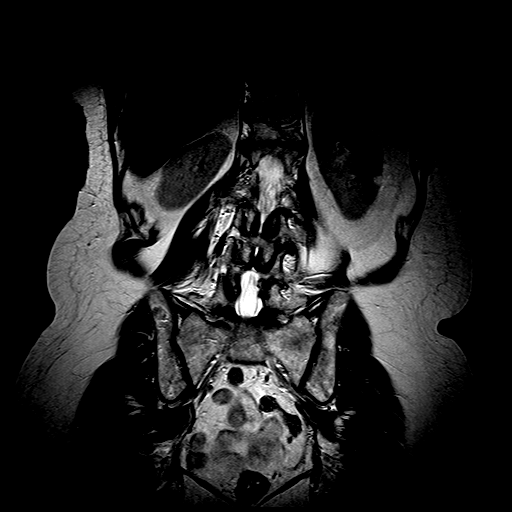
[im 9/18]
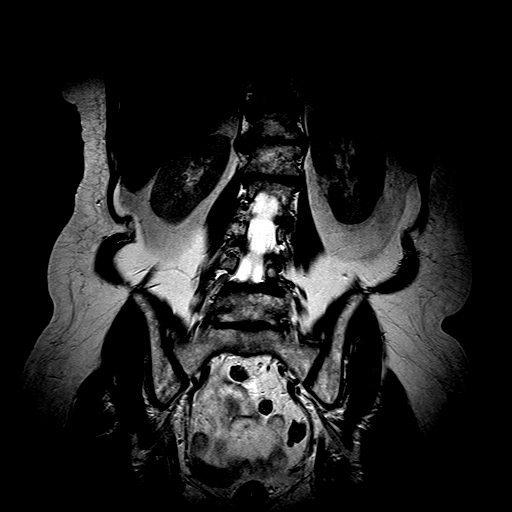
[im 11/18]
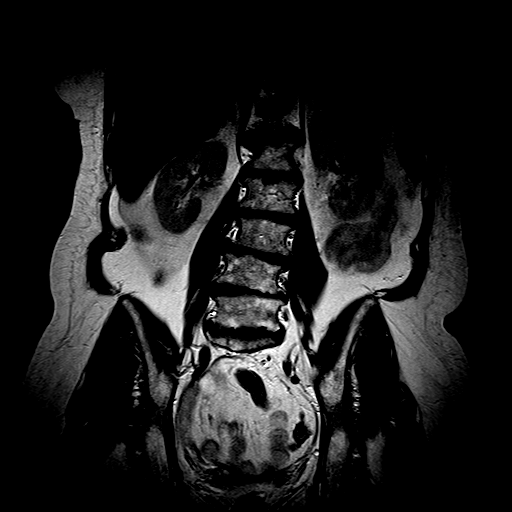
[im 13/18]
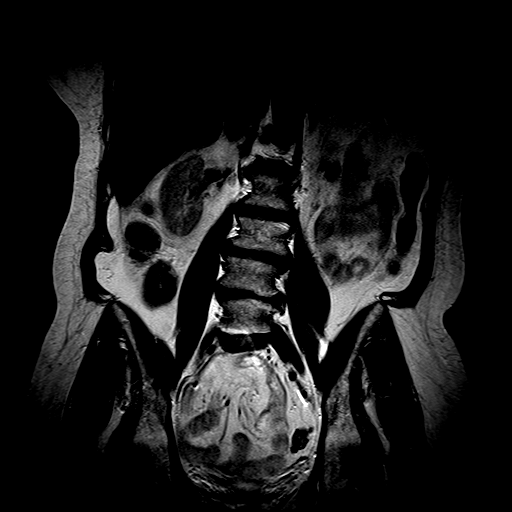
[im 15/18]
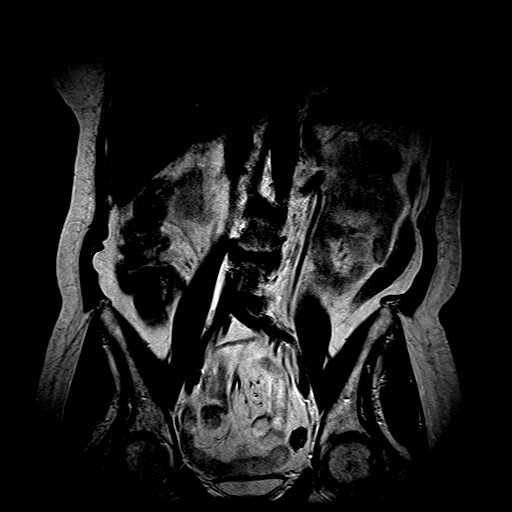
[im 18/18]
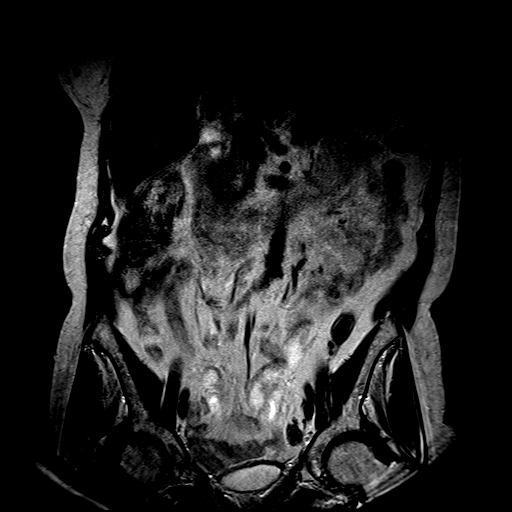

[Series 9: T2 · axial · 4.0mm · 0.52mm/px · z∈[-45,+184]mm · 9 of 18 slices shown (3 of 3)]
[im 1/18]
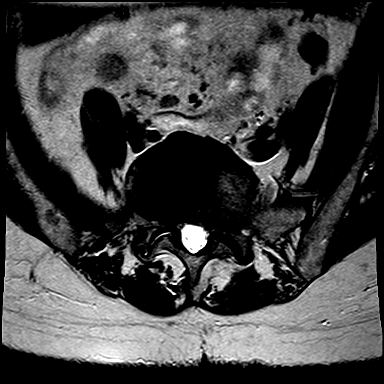
[im 3/18]
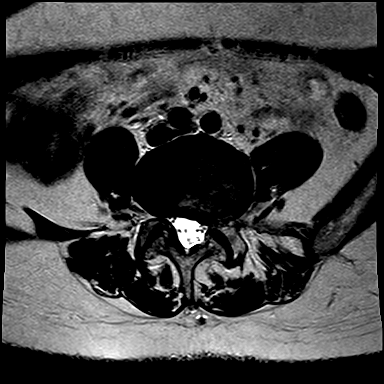
[im 5/18]
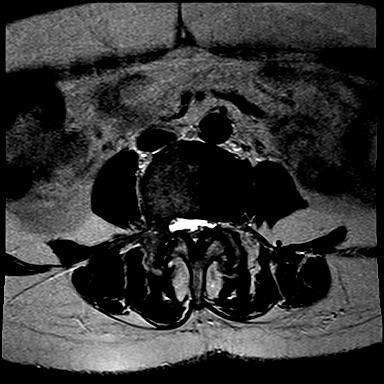
[im 7/18]
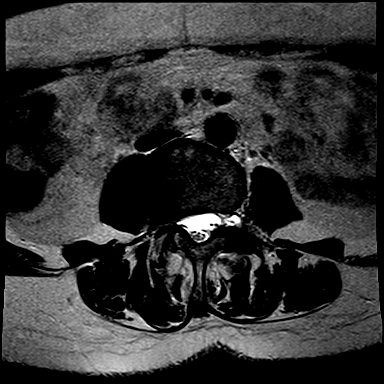
[im 9/18]
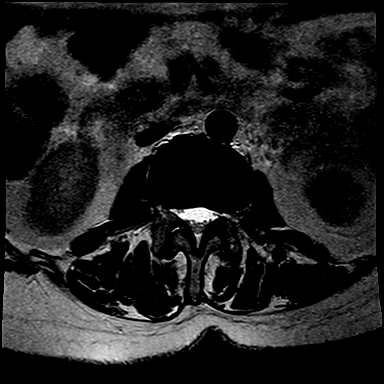
[im 11/18]
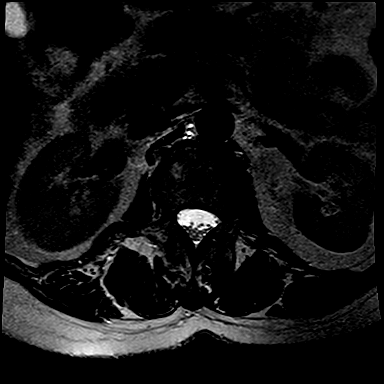
[im 13/18]
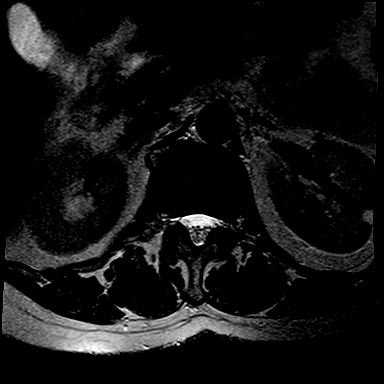
[im 15/18]
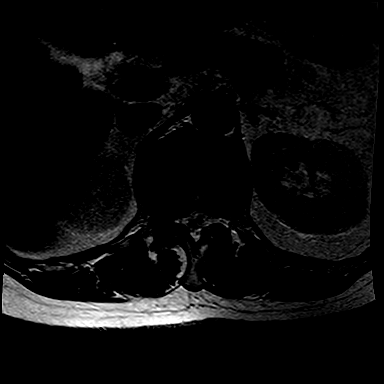
[im 18/18]
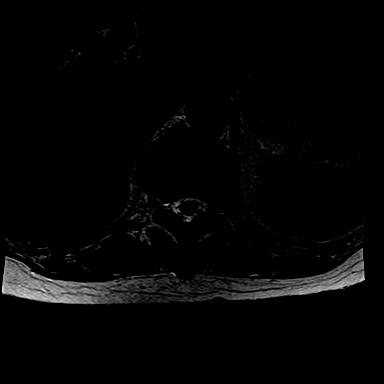

[28 of 48 positions shown; findings below may reference images not displayed]

FINDINGS: Mild scoliosis of thoracolumbar spine with curvature convex to the left at the thoracolumbar junction.

Conus terminates at T12-L1 level. Cauda structures are normal.

At T12-L1 level, severe degenerative disc disease is noted with bulging annulus and significant Modic type 1 inflammatory changes on both sides of the disc.  Bulging annulus and facet arthropathy causing significant compromise of both lateral recess and moderately significant compromise of thecal sac with AP diameter in the midline measuring 9 mm.

At L1-2 level, degenerative disc disease with bulging annulus and facet arthropathy are causing mild compromise of thecal sac and both lateral recess.

At L2-3 level, degenerative disc disease and facet arthropathy are causing moderate compromise of thecal sac and both lateral recess with AP diameter in the midline measuring 13 mm.

At L3-4 level, significant degenerative disc disease with bulging annulus and significant facet arthropathy with minimal degenerative anterolisthesis.  Significant compromise of lateral recess and neural foramina particularly on the left side.  Moderate compromise of thecal sac in the midline measuring 9.3 mm.

At L4-5 level, significant degenerative disc disease with asymmetric bulging annulus to the left with significant facet arthropathy.  Possible postsurgical changes on the left side.  Degenerative bulging annulus and facet arthropathy are causing severe spinal stenosis particularly on the left side impinging on L4 nerve roots on both sides, left more than the right.

At L5-S1 level, severe degenerative disc disease is noted with bulging annulus and facet arthropathy causing severe biforaminal stenosis impinging on the L5 nerve roots on both sides.  Degenerative changes are mildly impinging on the S1 nerve roots on both sides.

Paravertebral soft tissues are unremarkable.
IMPRESSION: 1. Mild scoliosis of thoracolumbar spine with curvature convex to the left at the thoracolumbar junction.

2. Significant Modic inflammatory changes on both sides of T12-L1 disc.

3. At L4-5 level, significant degenerative disc disease with asymmetric bulging annulus to the left with significant facet arthropathy.  Possible postsurgical changes on the left side.  Degenerative bulging annulus and facet arthropathy are causing severe spinal stenosis particularly on the left side impinging on L4 nerve roots on both sides, left more than the right.

4. At L5-S1 level, severe degenerative disc disease is noted with bulging annulus and facet arthropathy causing severe biforaminal stenosis impinging on the L5 nerve roots on both sides.  Degenerative changes are mildly impinging on the S1 nerve roots on both sides.

5. Findings at other disc levels are described above in detail.

## 2022-12-20 ENCOUNTER — Other Ambulatory Visit
Admission: RE | Admit: 2022-12-20 | Discharge: 2022-12-20 | Disposition: A | Discharge status: Home / Self Care | Payer: Medicare (Managed Care) | Source: Ambulatory Visit | Attending: Internal Medicine | Admitting: Internal Medicine

## 2022-12-20 DIAGNOSIS — Z0000 Encounter for general adult medical examination without abnormal findings: Secondary | ICD-10-CM | POA: Insufficient documentation

## 2022-12-20 DIAGNOSIS — F418 Other specified anxiety disorders: Secondary | ICD-10-CM | POA: Insufficient documentation

## 2022-12-20 DIAGNOSIS — E038 Other specified hypothyroidism: Secondary | ICD-10-CM | POA: Insufficient documentation

## 2022-12-20 LAB — CBC AND DIFFERENTIAL
Baso # K/uL: 0 10*3/uL (ref 0.0–0.2)
Eos # K/uL: 0.1 10*3/uL (ref 0.0–0.5)
Hematocrit: 43 % (ref 34–49)
Hemoglobin: 13.8 g/dL (ref 11.2–16.0)
IMM Granulocytes #: 0 10*3/uL (ref 0.0–0.0)
IMM Granulocytes: 0.3 %
Lymph # K/uL: 1.4 10*3/uL (ref 1.0–5.0)
MCV: 99 fL (ref 75–100)
Mono # K/uL: 0.7 10*3/uL (ref 0.1–1.0)
Neut # K/uL: 3.9 10*3/uL (ref 1.5–6.5)
Nucl RBC # K/uL: 0 10*3/uL (ref 0.0–0.0)
Nucl RBC %: 0 /100{WBCs} (ref 0.0–0.2)
Platelets: 256 10*3/uL (ref 150–450)
RBC: 4.3 MIL/uL (ref 4.0–5.5)
RDW: 13.3 % (ref 0.0–15.0)
Seg Neut %: 62.7 %
WBC: 6.2 10*3/uL (ref 3.5–11.0)

## 2022-12-20 LAB — COMPREHENSIVE METABOLIC PANEL
ALT: 32 U/L (ref 0–35)
AST: 29 U/L (ref 0–35)
Albumin: 4.3 g/dL (ref 3.5–5.2)
Alk Phos: 79 U/L (ref 35–105)
Anion Gap: 11 (ref 7–16)
Bilirubin,Total: 0.5 mg/dL (ref 0.0–1.2)
CO2: 27 mmol/L (ref 20–28)
Calcium: 9.3 mg/dL (ref 8.6–10.2)
Chloride: 103 mmol/L (ref 96–108)
Creatinine: 0.88 mg/dL (ref 0.51–0.95)
Glucose: 102 mg/dL — ABNORMAL HIGH (ref 60–99)
Lab: 21 mg/dL — ABNORMAL HIGH (ref 6–20)
Potassium: 4.3 mmol/L (ref 3.3–5.1)
Sodium: 141 mmol/L (ref 133–145)
Total Protein: 6.8 g/dL (ref 6.3–7.7)
eGFR BY CREAT: 70 *

## 2022-12-20 LAB — LIPID PANEL
Chol/HDL Ratio: 1.9
Cholesterol: 178 mg/dL
HDL: 96 mg/dL — ABNORMAL HIGH (ref 40–60)
LDL Calculated: 69 mg/dL
Non HDL Cholesterol: 82 mg/dL
Triglycerides: 64 mg/dL

## 2022-12-20 LAB — TSH: TSH: 4.04 u[IU]/mL (ref 0.27–4.20)

## 2023-04-16 ENCOUNTER — Other Ambulatory Visit: Payer: Self-pay

## 2023-04-17 ENCOUNTER — Ambulatory Visit
Admission: RE | Admit: 2023-04-17 | Discharge: 2023-04-17 | Disposition: A | Discharge status: Home / Self Care | Payer: Medicare (Managed Care) | Source: Ambulatory Visit | Attending: Plastic Surgery | Admitting: Plastic Surgery

## 2023-04-17 DIAGNOSIS — Z9889 Other specified postprocedural states: Secondary | ICD-10-CM | POA: Insufficient documentation

## 2023-04-17 DIAGNOSIS — Z853 Personal history of malignant neoplasm of breast: Secondary | ICD-10-CM | POA: Insufficient documentation

## 2023-04-17 DIAGNOSIS — Z9013 Acquired absence of bilateral breasts and nipples: Secondary | ICD-10-CM | POA: Insufficient documentation

## 2023-04-17 DIAGNOSIS — T8549XA Other mechanical complication of breast prosthesis and implant, initial encounter: Secondary | ICD-10-CM

## 2023-06-21 NOTE — Progress Notes (Incomplete)
 [facility] SUR[facility] ONCOLOGY FOLLOW-UP VISIT    [name] is a 73 y.o. 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  She presents today to the [facility] at [facility] for routine follow-up.    ONCOLOGY HISTORY:      Oncology History   Ductal carcinoma in situ (DCIS) of left breast   02/28/2017 Surgery    Left excisional breast biopsy for atypia (Dr. Dovie Gell)    Cancer Staging  Ductal carcinoma in situ (DCIS) of left breast  Staging form: Breast, AJCC 8th Edition  - Clinical stage from 02/28/2017: Stage Unknown (cTis (DCIS), cNX, cM0, ER: Positive, PR: Positive, HER2: Not assessed ) - Signed by Ema Hands, NP on 03/08/2017  : Tumor size: 60mm.  ER: 99%, strong staining; PR: 99%, strong staining;  cribiform and micropapillary type, NG1 with focal, punctate necrosis; closest margin: posterior (<78mm), lateral (1mm) and medial (<13mm);     02/28/2017 Initial Diagnosis    Ductal carcinoma in situ (DCIS) of left breast     02/28/2017 Significant Lab Findings    Cancer Staging  Ductal carcinoma in situ (DCIS) of left breast  Staging form: Breast, AJCC 8th Edition  - Clinical stage from 02/28/2017: Stage Unknown (cTis (DCIS), cNX, cM0, ER: Positive, PR: Positive, HER2: Not assessed ) - Signed by Ema Hands, NP on 03/08/2017  : ER: 99%, strong staining; PR: 99%, strong staining;   In situ component: 100%, cribiform type, NG1 with necrosis, 6cm. Closest margin: posterior <13mm.      03/28/2017 Surgery    Bilateral total mastectomy, left sentinel node biopsy, immediate tissue expander reconstruction- Paulino Bosch and Marja Sierra.     03/28/2017 Significant Lab Findings    Cancer Staging  Ductal carcinoma in situ (DCIS) of left breast  Staging form: Breast, AJCC 8th Edition  - Clinical stage from 02/28/2017: Stage Unknown (cTis (DCIS), cNX, cM0, ER: Positive, PR: Positive, HER2: Not assessed ) - Signed by Ema Hands, NP on 03/08/2017  - Pathologic stage from 04/07/2017: Stage 0 (pTis (DCIS), pN0(sn), cM0, ER: Positive, PR: Positive, HER2: Not assessed ) - Signed by Paulino Bosch, MD on 04/07/2017  : Tumor size: 90mm.  ER: 99%, strong staining; PR: 99%, strong staining;  Cribiform, micropapillary, papillary type, NG1 w  ith necrosis; without skin involvement, without nipple involvement, without muscle involvement. Closest margin: posterior ,1mm; 0/1 SLN/ALN, USC/VNPI: (Age score = 1) + (Path score = 1) + (Margin score = 3) + (Size score = 3) = 8, intermediate risk.         Cancer Staging   Ductal carcinoma in situ (DCIS) of left breast  Staging form: Breast, AJCC 8th Edition  - Clinical stage from 02/28/2017: Stage Unknown (cTis (DCIS), cNX, cM0, ER: Positive, PR: Positive, HER2: Not assessed ) - Signed by Ema Hands, NP on 03/08/2017  - Pathologic stage from 04/07/2017: Stage 0 (pTis (DCIS), pN0(sn), cM0, ER: Positive, PR: Positive, HER2: Not assessed ) - Signed by Paulino Bosch, MD on 04/07/2017       INTERVAL HISTORY:     Patient presents *** today and reports the following:   New breast / chest wall concerns: No concerns relating to her breasts / chest wall including skin change***, palpable mass***, nipple discharge***, or tenderness***.   New physical concerns: ***   New family cancer dx: no***   Up to date with PCP: yes***   Up to  date with GYN: yes***   Physical activity: stationary bike every other day, dancing, strength training ***   Alcohol intake: glass of wine nightly with dinner ***   Follows with dermatology for routine skin checks ***     Plastics 08/15/22 ***     Called patient to discuss her questions about ultrasound for her implants.  We reviewed that on exam, the areas of protrusion she was noting were consistent with wrinkles of the implant.  We had initially discussed ultrasound, but after her visit decided she would just like the implants out.  We discussed she would not need the  ultrasound if we are planning to remove as it will not change the plan and there were no concerning findings on her exam.  Her husband will need surgery in the upcoming months so we will see her again when she is ready for surgery and submit prior authorization to her insurance.   ***     Denies any new headaches, vision changes or dizzy spells.   Denies chest pain, shortness of breath, or persistent cough.  Denies any abdominal concerns, no nausea, vomiting, diarrhea.   No vaginal bleeding or dryness***, hot flashes, or sexual health concerns.   No progressive bone pain.   Weight stable.     She is retired and lives with her husband.     Social and family history reviewed, see below.     REVIEW OF SYSTEMS:      The pertinent positives and negatives are mentioned in the HPI. The remainder of the comprehensive 10 point review of systems was negative.       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    Social History     Socioeconomic History    Marital status: Married   Tobacco Use    Smoking status: Never    Smokeless tobacco: Never   Vaping Use    Vaping status: Never Used   Substance and Sexual Activity    Alcohol use: Yes     Alcohol/week: 7.0 standard drinks of alcohol     Types: 7 Glasses of wine per week    Drug use: No       CURRENT MEDICATIONS:      Current Outpatient Medications   Medication    metoprolol  tartrate (LOPRESSOR ) 25 mg tablet    omeprazole 20 mg capsule    buPROPion  (WELLBUTRIN SR) 200 mg 12 hr tablet    atorvastatin (LIPITOR) 20 mg tablet    levothyroxine  (SYNTHROID , LEVOTHROID) 112 mcg tablet    fluticasone (FLONASE) 50 MCG/ACT nasal spray     No current facility-administered medications for this visit.       PHYSICAL EXAM:      There were no vitals filed for this visit.  There were no vitals filed for this visit.  Wt Readings from Last 3 Encounters:   11/19/22 81.2 kg (179 lb)  07/13/22 80.7 kg (178 lb)   06/19/22 80.8 kg (178 lb 2.1 oz)       General appearance: Alert, oriented, no acute distress   Neurologic: No focal deficits   Psych: Appropriate affect   HEENT: Normocephalic, anicteric sclera  Neck: No cervical adenopathy   Lungs: CTA bilaterally, respirations non labored on room air   Heart: RRR  Chest: *** Breasts were examined in the sitting and supine positions.    Bilateral reconstructed breasts without palpable or visible lumps, bumps, or nodules; skin uniform. Right and left implants both with small outpouching just lateral to center of implants *** implant wrinkle noted on left breast on US  ***   No axillary, supraclavicular, or subclavicular lymphadenopathy.   Abdomen: Soft, nontender, nondistended, no palpable masses or organomegaly  Back: No tenderness on palpation of the spine  Extremities: No edema   Skin: Warm and dry     IMAGING:     US  breast bilateral 04/17/23 [[facility]]      Finding 1:  Implant wrinkle at the site of palpable abnormality in the reconstructed left breast. There is no focal suspicious sonographic abnormality in either reconstructed breast.      Finding 2:  Normal lymph nodes in both axillae are benign.      Clinical follow up is recommended.      The patient was made aware of the above findings and recommendations immediately after the exam.      BI-RADS Category 2: Benign     IMPRESSION/PLAN:     There are no diagnoses linked to this encounter.    She is without evidence of disease recurrence.     Plan:   Routine breast imaging for screening  purposes not needed in the setting of bilateral mastectomy   We discussed being breast aware and to call with any new concerns   Reviewed recommendations for healthy lifestyle -- physical activity, healthy diet, avoidance of smoking, limitation of alcohol use, sunscreen use      Follow up with survivorship oncology in one year. She was encouraged to call with any questions that may arise in the interim.     I personally spent *** minutes on the calendar day of encounter, including pre and post visit work.    Toi Foster, PA-C   Survivorship Oncology Clinic  [facility]  [address]  [place] [address]

## 2023-06-24 ENCOUNTER — Telehealth: Payer: Self-pay | Admitting: Oncology

## 2023-06-24 NOTE — Telephone Encounter (Signed)
 Sending mychart message

## 2023-06-24 NOTE — Telephone Encounter (Signed)
 Patient calling to reschedule tomorrows appointment 5/13 @1 . Would like to change to June appointment for late morning or early afternoon. Requested to send change in MyChart.

## 2023-06-25 ENCOUNTER — Ambulatory Visit: Payer: Medicare (Managed Care) | Admitting: Oncology

## 2023-07-15 NOTE — Progress Notes (Signed)
 [facility] ONCOLOGY FOLLOW-UP VISIT    [name] is a 73 y.o. 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  She presents today to the [facility] at [facility] for routine follow-up.    ONCOLOGY HISTORY:      Oncology History   Ductal carcinoma in situ (DCIS) of left breast   02/28/2017 Surgery    Left excisional breast biopsy for atypia (Dr. Dovie Gell)    Cancer Staging  Ductal carcinoma in situ (DCIS) of left breast  Staging form: Breast, AJCC 8th Edition  - Clinical stage from 02/28/2017: Stage Unknown (cTis (DCIS), cNX, cM0, ER: Positive, PR: Positive, HER2: Not assessed ) - Signed by Ema Hands, NP on 03/08/2017  : Tumor size: 60mm.  ER: 99%, strong staining; PR: 99%, strong staining;  cribiform and micropapillary type, NG1 with focal, punctate necrosis; closest margin: posterior (<42mm), lateral (1mm) and medial (<53mm);     02/28/2017 Initial Diagnosis    Ductal carcinoma in situ (DCIS) of left breast     02/28/2017 Significant Lab Findings    Cancer Staging  Ductal carcinoma in situ (DCIS) of left breast  Staging form: Breast, AJCC 8th Edition  - Clinical stage from 02/28/2017: Stage Unknown (cTis (DCIS), cNX, cM0, ER: Positive, PR: Positive, HER2: Not assessed ) - Signed by Ema Hands, NP on 03/08/2017  : ER: 99%, strong staining; PR: 99%, strong staining;   In situ component: 100%, cribiform type, NG1 with necrosis, 6cm. Closest margin: posterior <15mm.      03/28/2017 Surgery    Bilateral total mastectomy, left sentinel node biopsy, immediate tissue expander reconstruction- Paulino Bosch and Marja Sierra.     03/28/2017 Significant Lab Findings    Cancer Staging  Ductal carcinoma in situ (DCIS) of left breast  Staging form: Breast, AJCC 8th Edition  - Clinical stage from 02/28/2017: Stage Unknown (cTis (DCIS), cNX, cM0, ER: Positive, PR: Positive, HER2: Not assessed ) - Signed by Ema Hands, NP on 03/08/2017  - Pathologic stage from 04/07/2017: Stage 0 (pTis (DCIS), pN0(sn), cM0, ER: Positive, PR: Positive, HER2: Not assessed ) - Signed by Paulino Bosch, MD on 04/07/2017  : Tumor size: 90mm.  ER: 99%, strong staining; PR: 99%, strong staining;  Cribiform, micropapillary, papillary type, NG1 w  ith necrosis; without skin involvement, without nipple involvement, without muscle involvement. Closest margin: posterior ,1mm; 0/1 SLN/ALN, USC/VNPI: (Age score = 1) + (Path score = 1) + (Margin score = 3) + (Size score = 3) = 8, intermediate risk.          Cancer Staging   Ductal carcinoma in situ (DCIS) of left breast  Staging form: Breast, AJCC 8th Edition  - Clinical stage from 02/28/2017: Stage Unknown (cTis (DCIS), cNX, cM0, ER: Positive, PR: Positive, HER2: Not assessed ) - Signed by Ema Hands, NP on 03/08/2017  - Pathologic stage from 04/07/2017: Stage 0 (pTis (DCIS), pN0(sn), cM0, ER: Positive, PR: Positive, HER2: Not assessed ) - Signed by Paulino Bosch, MD on 04/07/2017       INTERVAL HISTORY:     Patient presents alone today and reports the following:   New breast / chest wall concerns: No concerns relating to her breasts / chest wall including skin change, palpable mass, or tenderness.   She saw plastic surgery team last summer and had an ultrasound done in March for areas of protrusion which showed wrinkling of the implants.  Planning to have implants removed sometime within the next year (Dr. Alois Jakob)   New physical concerns: needs knee replacement surgery on both sides    New family cancer dx: no   Up to date with PCP: yes   Up to date with GYN: no longer follows   Physical activity: has been limited recently due to knee pain. Used to walk 5 miles per day. Still doing some strength training.    Alcohol intake: glass of wine nightly with dinner     Follows with dermatology for routine skin checks    Her husband has had multiple surgeries in the last few years - currently doing  well   She is planning to go on a cruise with a friend in December     Denies any new headaches or dizzy spells.   Denies chest pain, shortness of breath, or persistent cough.  Denies any abdominal concerns, no nausea, vomiting, diarrhea.   No vaginal bleeding.   No progressive bone pain.   Weight stable.     She is retired and lives with her husband in [place].     Social and family history reviewed, see below.     REVIEW OF SYSTEMS:      The pertinent positives and negatives are mentioned in the HPI. The remainder of the comprehensive 10 point review of systems was negative.       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    Social History     Socioeconomic History    Marital status: Married   Tobacco Use    Smoking status: Never    Smokeless tobacco: Never   Vaping Use    Vaping status: Never Used   Substance and Sexual Activity    Alcohol use: Yes     Alcohol/week: 7.0 standard drinks of alcohol     Types: 7 Glasses of wine per week    Drug use: No       CURRENT MEDICATIONS:      Current Outpatient Medications   Medication    metoprolol  tartrate (LOPRESSOR ) 25 mg tablet    omeprazole 20 mg capsule    buPROPion (WELLBUTRIN SR) 200 mg 12 hr tablet    atorvastatin (LIPITOR) 20 mg tablet    levothyroxine  (SYNTHROID , LEVOTHROID) 112 mcg tablet    fluticasone (FLONASE) 50 MCG/ACT nasal spray     No current facility-administered medications for this visit.       PHYSICAL EXAM:      Vitals:     07/16/23 1312   BP: 144/85   Pulse: 86   Temp: 35.9 C (96.6 F)   TempSrc: Temporal   SpO2: 93%   Weight: 81.3 kg (179 lb 3.7 oz)     Pain    07/16/23 1312   PainSc:   0 - No pain     Wt Readings from Last 3 Encounters:   07/16/23 81.3  kg (179 lb 3.7 oz)   11/19/22 81.2 kg (179 lb)   07/13/22 80.7 kg (178 lb)     General appearance: Alert, oriented, no acute distress   Neurologic: No focal deficits   Psych: Appropriate affect   HEENT: Normocephalic, anicteric sclera  Neck: No cervical adenopathy   Lungs: CTA bilaterally, respirations non labored on room air   Heart: RRR  Chest: Breasts were examined in the sitting and supine positions.    Bilateral reconstructed breasts without palpable or visible lumps, bumps, or nodules; skin uniform. Left implants with small outpouching just lateral to center of implant, stable since prior exam - implant wrinkle noted on left breast on US . Similar finding on right breast noted last visit has since resolved.    No axillary, supraclavicular, or subclavicular lymphadenopathy.   Abdomen: Soft, nontender, nondistended  Back: No tenderness on palpation of the spine  Extremities: No edema   Skin: Warm and dry     IMAGING:     US  breast bilateral 04/17/23 [[facility]]      Finding 1:  Implant wrinkle at the site of palpable abnormality in the reconstructed left breast. There is no focal suspicious sonographic abnormality in either reconstructed breast.      Finding 2:  Normal lymph nodes in both axillae are benign.      Clinical follow up is recommended.      The patient was made aware of the above findings and recommendations immediately after the exam.      BI-RADS Category 2: Benign     IMPRESSION/PLAN:     1. Ductal carcinoma in situ (DCIS) of left breast    She is without evidence of disease recurrence.     Plan:   Routine breast imaging for screening purposes not needed in the setting of bilateral mastectomy    We discussed being breast aware and to call with any new concerns   Reviewed  recommendations for healthy lifestyle -- physical activity, healthy diet, avoidance of smoking, limitation of alcohol use, sunscreen use     Follow up with survivorship oncology in one year. She was encouraged to call with any questions that may arise in the interim.     I personally spent 20 minutes on the calendar day of encounter, including pre and post visit work.    Toi Foster, PA-C   Survivorship Oncology Clinic  [facility]  [address]  [place] [address]

## 2023-07-16 ENCOUNTER — Other Ambulatory Visit: Payer: Self-pay

## 2023-07-16 ENCOUNTER — Ambulatory Visit: Payer: Medicare (Managed Care) | Attending: Oncology | Admitting: Oncology

## 2023-07-16 VITALS — BP 144/85 | HR 86 | Temp 96.6°F | Wt 179.2 lb

## 2023-07-16 DIAGNOSIS — D0512 Intraductal carcinoma in situ of left breast: Secondary | ICD-10-CM | POA: Insufficient documentation

## 2023-08-11 ENCOUNTER — Other Ambulatory Visit: Payer: Self-pay

## 2023-08-12 ENCOUNTER — Ambulatory Visit: Payer: Medicare (Managed Care) | Admitting: Orthopedic Surgery

## 2023-08-12 DIAGNOSIS — M25562 Pain in left knee: Secondary | ICD-10-CM

## 2023-08-22 ENCOUNTER — Ambulatory Visit
Admission: RE | Admit: 2023-08-22 | Discharge: 2023-08-22 | Disposition: A | Discharge status: Home / Self Care | Payer: Medicare (Managed Care) | Source: Ambulatory Visit | Attending: Orthopedic Surgery | Admitting: Orthopedic Surgery

## 2023-08-22 ENCOUNTER — Other Ambulatory Visit: Payer: Self-pay

## 2023-08-22 DIAGNOSIS — M25561 Pain in right knee: Secondary | ICD-10-CM | POA: Insufficient documentation

## 2023-08-22 DIAGNOSIS — M25562 Pain in left knee: Secondary | ICD-10-CM | POA: Insufficient documentation

## 2023-08-22 DIAGNOSIS — M170 Bilateral primary osteoarthritis of knee: Secondary | ICD-10-CM | POA: Insufficient documentation

## 2023-08-30 ENCOUNTER — Other Ambulatory Visit
Admission: RE | Admit: 2023-08-30 | Discharge: 2023-08-30 | Disposition: A | Discharge status: Home / Self Care | Payer: Medicare (Managed Care) | Source: Ambulatory Visit | Attending: Primary Care | Admitting: Primary Care

## 2023-08-30 DIAGNOSIS — R7303 Prediabetes: Secondary | ICD-10-CM | POA: Insufficient documentation

## 2023-08-30 DIAGNOSIS — E039 Hypothyroidism, unspecified: Secondary | ICD-10-CM | POA: Insufficient documentation

## 2023-08-30 LAB — HEMOGLOBIN A1C: Hemoglobin A1C: 5.9 % — ABNORMAL HIGH (ref <=5.6)

## 2023-08-30 LAB — TSH: TSH: 1.45 u[IU]/mL (ref 0.27–4.20)

## 2023-08-30 LAB — T4, FREE: Free T4: 1.5 ng/dL (ref 0.9–1.7)

## 2023-09-15 ENCOUNTER — Other Ambulatory Visit: Payer: Self-pay

## 2023-09-16 ENCOUNTER — Ambulatory Visit
Admission: RE | Admit: 2023-09-16 | Discharge: 2023-09-16 | Disposition: A | Discharge status: Home / Self Care | Payer: Medicare (Managed Care) | Source: Ambulatory Visit

## 2023-09-16 ENCOUNTER — Ambulatory Visit: Payer: Medicare (Managed Care) | Attending: Orthopedic Surgery | Admitting: Orthopedic Surgery

## 2023-09-16 ENCOUNTER — Other Ambulatory Visit: Payer: Self-pay | Admitting: Orthopedic Surgery

## 2023-09-16 VITALS — BP 123/84 | HR 81 | Ht 61.02 in | Wt 179.9 lb

## 2023-09-16 DIAGNOSIS — M1712 Unilateral primary osteoarthritis, left knee: Secondary | ICD-10-CM | POA: Insufficient documentation

## 2023-09-16 DIAGNOSIS — M25561 Pain in right knee: Secondary | ICD-10-CM

## 2023-09-16 DIAGNOSIS — M25562 Pain in left knee: Secondary | ICD-10-CM

## 2023-09-16 DIAGNOSIS — M1711 Unilateral primary osteoarthritis, right knee: Secondary | ICD-10-CM

## 2023-09-16 DIAGNOSIS — M170 Bilateral primary osteoarthritis of knee: Secondary | ICD-10-CM

## 2023-09-16 MED ORDER — LIDOCAINE HCL 1 % IJ SOLN *I*
2.0000 mL | Freq: Once | INTRAMUSCULAR | Status: AC | PRN
Start: 2023-09-16 — End: 2023-09-16
  Administered 2023-09-16: 2 mL via INTRA_ARTICULAR

## 2023-09-16 MED ORDER — METHYLPREDNISOLONE ACETATE 40 MG/ML IJ SUSP *I*
40.0000 mg | Freq: Once | INTRAMUSCULAR | Status: AC | PRN
Start: 2023-09-16 — End: 2023-09-16
  Administered 2023-09-16: 40 mg via INTRA_ARTICULAR

## 2023-09-16 MED ORDER — ETHYL CHLORIDE EX AERO (MULTIPLE PATIENTS) *I*
1.0000 | INHALATION_SPRAY | Freq: Once | CUTANEOUS | Status: AC | PRN
Start: 2023-09-16 — End: 2023-09-16
  Administered 2023-09-16: 1 via TOPICAL

## 2023-09-16 NOTE — Progress Notes (Signed)
 ADULT RECONSTRUCTION NEW PATIENT VISITChief Complaint: Bilateral knee pain (left > right)History of Present IllnessThe patient is a 73 year old female who presents today as a new patient regarding bilateral knee pain.She has been experiencing pain in both knees, with the left knee being more affected, for several years. The intensity of the pain has increased over the past few months without any specific injury or trauma. Discomfort is noted particularly when descending stairs, walking downhill, prolonged walking on hard surfaces like concrete, standing for extended periods, and physical exertion. The pain is described as aching and occasionally sharp, and is more pronounced at the back of the knee. There is no numbness, tingling, or weakness in the entire left lower extremity, but occasional numbness on the outer sides of both knees is reported, which does not extend up the leg. No associated low back pain is present. Ambulation is possible without the aid of a cane or walker, but prolonged walking is challenging due to symptoms. Various conservative treatments including oral and topical analgesics, home knee exercises, rest, ice, elevation, and activity modification have been tried without significant alleviation of symptoms. No recent intra-articular cortisone or viscosupplementation injections have been received.She is not diabetic, does not smoke, and has no history of DVT or blood clots. She is not on an oral anticoagulant. Yearly health maintenance is conducted by her primary care physician, and she is followed by a cardiologist for PVCs and palpitations, for which she takes a beta blocker. A history of breast cancer and bilateral mastectomies is noted, diagnosed in 2019. She is not currently undergoing treatment for cancer and is being surveyed for this condition as she is thought to be in remission. Other than bilateral knee pain, no other acute complaints are endorsed today, and she is  feeling well. Consideration is being given to total knee arthroplasty as a definitive treatment for ongoing knee symptoms, as she feels her quality of life has decreased despite regular use of conservative care measures. Past Medical History:Past Medical History[1]Past Surgical History:Past Surgical History[2]Social History: reports that she has never smoked. She has never used smokeless tobacco. She reports current alcohol use of about 7.0 standard drinks of alcohol per week. She reports that she does not use drugs.Family History:Family History[3]Current Medications:Medications Ordered Prior to Encounter[4] Allergies:Allergies[5]ROS:Constitutional: Negative for chills and fever. Respiratory: Negative for cough and wheezing.  Cardiovascular: Negative for chest pain and palpitations. Musculoskeletal: Positive for joint pain. All other systems are unremarkable.Physical Exam:Body mass index is 33.97 kg/m.BP 123/84   Pulse 81   Ht 1.55 m (5' 1.02)   Wt 81.6 kg (179 lb 14.4 oz)   BMI 33.97 kg/m Pain  09/16/23 0839 PainSc:   5 PainLoc: Knee  Constitutional: NAD, well-developed, well-nourished, comfortable Psychiatric: Mood and affect appropriateVascular: Compartments soft and compressibleMusculoskeletal: Ambulating without an antalgic gait.Bilateral knee: Skin surrounding the knee is clean, dry, and intact; mild effusion. Alignment- normalTenderness to palpation medial joint lineExtension 0 degrees, flexion 120 degreesKnee joint is grossly stable5/5 DF/PF, ITLT tibial and peroneal nerve, 2+ DP/PT pulse; distal extremity CMS intact.Radiographs: 4 views of bilateral knee(s) Performed today show mild joint space loss of the patello-femoral joint and severe joint space loss of the tibiofemoral joint.IMPRESSION: Bilateral knee OA, severeLeft knee symptomatically and radiographically more severe than right  Assessment & Plan1. Severe bilateral knee osteoarthritis:- Radiographs of the bilateral knees performed today were reviewed and demonstrate severe osteoarthritis of the bilateral knee joint with bone-on-bone contact and medial joint spaces in both the right and left knee. Radiographically and  symptomatically, the left knee is worse.- Treatment options are limited to ongoing conservative care versus total knee reconstruction.- Total knee arthroplasty was discussed on a broad level, including risks, benefits, and technical aspects of surgery. Risks include infection, blood clots, and prosthesis issues. Benefits include pain relief and improved mobility.- Patient elects to pursue scheduling of left total knee arthroplasty on an elective basis. An order for surgical scheduling was placed today.- Reevaluation in the clinic in approximately 1 month with Dr. Lajuanda for formal preoperative discussion for proposed left total knee replacement. - Clearances for surgery required from primary care physician and cardiologist within 30 to 45 days of surgical date.- [facility] by [facility] for presurgical laboratory testing and examination within 30 days of surgical date.- Interested in pursuing bilateral cortisone injections today for temporizing pain relief due to upcoming travel plans.- Risks, benefits, and perspective therapeutic response to cortisone injections reviewed. No known allergies to cortisone or lidocaine  products. Not currently being treated for active infection.- Bilateral knee cortisone injections performed today in the office. Tolerated well without acute complication.- May receive injections as needed on a 3 to 28-month basis. Understands she may not undergo elective knee arthroplasty within 3 months of obtaining injection.- Post injection instructions provided. Should new or worsening bilateral knee symptoms develop in the interim prior to reevaluation in  4 weeks, contact the office for more urgent reevaluation.- In agreement with the treatment plan as outlined. All additional questions invited and answered to overall satisfaction.Follow-up: in 4 weeks with Dr. Lajuanda for final preoperative discussion for proposed left total knee replacement. PROCEDUREBilateral knee cortisone injections were performed today in the office.This document was dictated with Dragon and DAX CoPilot voice recognition software. Please excuse any errors as the document was typeread to the best of my ability.Lauraine Fraise, FNP-CUniversity of Parkville Medical CenterDepartment of Orthopaedic SurgeryDivision of Adult ReconstructionAnswers submitted by the patient for this visit:Left knee (Submitted on 09/15/2023)Chief Complaint: LEFT KNEE PAIN HPIWhat is your goals for today's visit?: Discuss pain management/both kneesDate of onset: : 7/1/2024Was this the result of an injury?: NoWhat is your pain level?: 7/10Please describe the quality of your pain: : radiating, stabbing, throbbing, tinglingWhat diagnostic workup have you had for this condition?: X-rayWhat treatments have you tried for this condition?: activity modification, ice, muscle relaxants, topical medicationsProgression since onset: : gradually worseningIs this a work related condition? : NoCurrent work status: : limited work Automotive engineer about: LEFT KNEE PAIN HPI (Submitted on 09/15/2023)Chief Complaint: LEFT KNEE PAIN HPI [1] Past Medical History:Diagnosis Date  Anxiety   Arthritis   Cancer   Depression   Ductal carcinoma in situ (DCIS) of left breast   GERD (gastroesophageal reflux disease)   Hyperlipidemia   Hypothyroidism  [2] Past Surgical History:Procedure Laterality Date  KNEE CARTILAGE SURGERY Left 2014  KNEE SURGERY Left   PR BX/EXC LYMPH NODE OPEN DEEP AXILLARY NODE Left 03/28/2017  Procedure: AXILLARY LYMPH NODE BIOPSY  OR DISSECTION;  Surgeon: Patterson Caldron, MD;  Location[facility];  Service: Oncology General  PR EXC BREAST LES PREOP PLMT RAD MARKER OPEN 1 LES Left 02/28/2017  Procedure: LEFT BREAST BIOPSY WITH NEEDLE LOCALIZATION  ;  Surgeon: Patterson Caldron, MD;  Location: [facility];  Service: Oncology General  PR INSERTION BREAST IMPLANT SAME DAY OF MASTECTOMY Bilateral 03/28/2017  Procedure: BILATERAL IMMEDIATE RECONSTRUCTION OF BREASTS WITH TISSUE EXPANDER INSERTION AND ACELLULAR DERMAL MATRIX;  Surgeon: Langstein, Howard N, MD;  Location: [facility];  Service: Plastics  PR INSERTION BREAST  IMPLANT SAME DAY OF MASTECTOMY Bilateral 07/25/2017  Procedure: Bilateral Tissue Exapnder Removal with Bilateral Implant Insertion;  Surgeon: Thermon Kayla SAILOR, MD;  Location: [facility];  Service: Plastics  PR MASTECTOMY SIMPLE COMPLETE Bilateral 03/28/2017  Procedure: BILATERAL MASTECTOMY;  Surgeon: Patterson Caldron, MD;  Location: [facility];  Service: Oncology General  TONSILLECTOMY   [3] Family HistoryProblem Relation Name Age of Onset  Lung cancer Father    Cancer Father        Lung  Breast cancer Sister        half sister  Cancer Sister  2      breast  Rectal cancer Brother        half brother  Alzheimer's disease Mother    High Blood Pressure Mother    No Known Problems Son    Thyroid  disease Sister    Cancer Sister        Breast  Cancer Brother        Prostate  COPD Brother    No Known Problems Son    No Known Problems Son    Diabetes Neg Hx   [4] Current Outpatient Medications on File Prior to Visit Medication Sig Dispense Refill  metoprolol  tartrate (LOPRESSOR ) 25 mg tablet Take 0.5 tablets (12.5 mg total) by mouth 2 times daily. 90 tablet 3  omeprazole 20 mg capsule Take 1 capsule (20 mg total) by mouth as needed.    buPROPion (WELLBUTRIN SR) 200 mg 12 hr tablet Take 1 tablet (200 mg total) by mouth daily.    atorvastatin (LIPITOR) 20  mg tablet Take 1 tablet (20 mg total) by mouth daily.    levothyroxine  (SYNTHROID , LEVOTHROID) 112 mcg tablet Take 1 tablet (112 mcg total) by mouth daily.    fluticasone (FLONASE) 50 MCG/ACT nasal spray Spray 1 spray into nostril daily as needed for Rhinitis.    celecoxib 200 mg capsule Take 1 capsule (200 mg total) by mouth 2 times daily. (Patient not taking: Reported on 09/16/2023)    traZODone 50 mg tablet Take 1 tablet (50 mg total) by mouth nightly. (Patient not taking: Reported on 09/16/2023)   No current facility-administered medications on file prior to visit. [5] AllergiesAllergen Reactions  Metformin Diarrhea

## 2023-09-16 NOTE — Patient Instructions (Signed)
 After Injection Instructions:    The injection you received today may take 5-7 days to work in the case of cortisone or 3 weeks or more for hyaluronic acid viscosupplements (Synvisc, Hyalgan, Orthovisc or others).   You may find the area that was injected to be more painful for 1 or 2 days after the injection, after the numbing medicine wears off.  This happens as the medicine is being absorbed in your body.  Rest for 2-3 days, activities of daily living are fine.   It may be helpful to put ice on the body part that was injected to ease the pain and rest the joint for today.  You may also use pain medication - Tylenol, Advil/Motrin or Aleve as appropriate.  A cortisone injection may cause systemic reactions, including flushing, feeling hot, irritability or inability to sleep.  If you are diabetic, a cortisone injection can increase your blood sugar level for several days.  Monitor your glucose levels closely.  Contact the office at [phone] if you feel ill, have excessive pain, if the area turns red or you have a fever

## 2023-09-16 NOTE — Procedures (Signed)
 Large Joint Aspiration/Injection Procedure: R knee intra - articularDate/Time: 09/16/2023  8:15 AM EDTConsent given by: patientSite marked: site markedTimeout: Immediately prior to procedure a time out was called to verify the correct patient, procedure, equipment, support staff and site/side marked as required Procedure DetailsLocation: knee - R knee intra - articularPreparation: The site was prepped using the usual aseptic technique.Anesthetics administered:  1 spray ethyl chloride; 2 mL lidocaine  HCL 1 %Intra-Articular Steroids administered:  40 mg methylPREDNISolone  acetate 40 MG/MLDressing:  A dry, sterile dressing was applied.Patient tolerance: patient tolerated the procedure well with no immediate complications

## 2023-09-16 NOTE — Nurse Navigator (Signed)
 Orthopaedic's Total Joint Replacement Nurse Navigator Pre-operative Screen8/4/2025Patti A Bruemmer was referred by [facility]: Dr. Lajuanda for a left knee replacement.[]  MAKO - CT SCAN NEEDED SHAUN REED MADE AWARE[]  HETEROTOPIC OSSIFICATION PROPHYLAXIS NEEDED  Scheduling: [x]  No Barriers to Scheduling Identified[x]  NEEDS CLEARANCE LETTER SENT TO : PCP AND CARDIOLOGY[]  RECENT JOINT REPLACEMENT/REVISION IN THE LAST 6 MONTHS: - DATE    []  Cancellation List []  Same day discharge patient [x]  Location: [facility][x]  Chart reviewed for cortisone/viscos injections Date  (<3 month): 09/16/23 Schedule surgery after: 12/17/23[]  Biologic agents - Hold for one dose prior to surgery and schedule surgery one week after the missed dose:[]  Barriers to scheduling surgery Identified  - DO NOT SCHEDULE  []  AWAITING LAB RESULTS - A1c, SMOKING LABS NEEDED[]  NEEDS WORKMAN'S APPROVAL - Please initiate authorization process and let the Ortho Nurse Navigators know when you receive approval[]  ELEVATED A1c[]  ELEVATED BMI[]  OTHER[]   Complex Patient [x]   At Risk Discharge Assessment[x]  73 y.o.[]  Patient does not have adequate social supports for post-operative care at home.[x]  Co morbidities : palpitations, breast cancer[x]   Home Care Referral Assessment - [facility] Pre-op Screen routed to [place], [place], [place], [place], [place][x]  Needs Home Care Referral [x]  1st Knee Replacement[]  Age > 75[]  Lack of Social Support[]  No Smart Phone, No Active Email Address and/or No Active MyChart[]  Prior Use of DME with [facility] [place][]  Extensive Co-Morbidities[]  Patient Request IN HOME PT[]  Comments      The patients chart reviewed for the follow:[x]  Social Determinates of health:  [x]  None Identified [x]  BPBP Readings from Last 3 Encounters: 09/16/23 123/84 07/16/23 144/85 11/19/22  138/70 [x]  BMI:BMI Readings from Last 1 Encounters: 09/16/23 33.97 kg/m [x]  PCP verified[x]  A1C: Hemoglobin A1C (%) Date Value Status 08/30/2023 5.9 (H) Final 04/26/2022 6.1 (H) Final [x]  Medications reviewed Select if currently prescribed. []  Anticoagulation []  Urinary medications []  Immunosuppressant []  Current narcotic use[x]  Surgeons note reviewed / NP note[x]  Clearances needed:[x]  PCP[x]  Cardiology []  Nephrology []  Rheumatology []  Neurology[]  Neurosurgeon []  Hematology/Oncology []  Pulmonology[]  Other[]  Additional Comments:[]  Additional Patient Education:Arrington Bencomo L Braison Snoke, RNNurse Navigator, [facility]

## 2023-09-16 NOTE — Procedures (Signed)
 Large Joint Aspiration/Injection Procedure: L knee intra - articularDate/Time: 09/16/2023  8:15 AM EDTConsent given by: patientSite marked: site markedTimeout: Immediately prior to procedure a time out was called to verify the correct patient, procedure, equipment, support staff and site/side marked as required Procedure DetailsLocation: knee - L knee intra - articularPreparation: The site was prepped using the usual aseptic technique.Anesthetics administered:  1 spray ethyl chloride; 2 mL lidocaine  HCL 1 %Intra-Articular Steroids administered:  40 mg methylPREDNISolone  acetate 40 MG/MLDressing:  A dry, sterile dressing was applied.Patient tolerance: patient tolerated the procedure well with no immediate complications

## 2023-09-18 ENCOUNTER — Encounter: Payer: Self-pay | Admitting: Orthopedic Surgery

## 2023-09-18 ENCOUNTER — Encounter: Payer: Self-pay | Admitting: Gastroenterology

## 2023-09-18 ENCOUNTER — Telehealth: Payer: Self-pay

## 2023-09-18 NOTE — Telephone Encounter (Addendum)
 CLEARANCE FORM: Surgeon/Office: Dr.Ginnetti Type of surgery/procedure needed: Left total knee arthroplasty  Procedure date: 06/04/24  Duration of procedure: Type of anesthesia: General   Last visit at [facility]: 09/06/21 Medications Recommendations?  Any?  Phone: [phone]: [phone] is scheduled for 02/02/24 Clearance noted in the appointment note.

## 2023-10-16 ENCOUNTER — Ambulatory Visit: Payer: Medicare (Managed Care) | Admitting: Orthopedic Surgery

## 2023-10-17 ENCOUNTER — Telehealth: Payer: Self-pay

## 2023-10-17 ENCOUNTER — Ambulatory Visit: Payer: Medicare (Managed Care)

## 2023-10-17 NOTE — Telephone Encounter (Signed)
 Copied from CRM #[id number]. Topic: Appointments - Cancel Appointment>> Oct 17, 2023  8:09 AM Maura SAUNDERS wrote:Patient called to cancel follow up appointment scheduled for today with Dr. Ellan could not come in today due to not feeling well Writer canceled Patient advised will call back at a later time to reschedule If there are any further questions please call the patient at [phone]

## 2023-10-18 ENCOUNTER — Ambulatory Visit: Payer: Medicare (Managed Care)

## 2023-11-07 ENCOUNTER — Encounter: Payer: Self-pay | Admitting: Orthopedic Surgery

## 2023-11-12 ENCOUNTER — Encounter: Payer: Self-pay | Admitting: Family Medicine

## 2023-11-13 ENCOUNTER — Ambulatory Visit: Payer: Medicare (Managed Care) | Admitting: Orthopedic Surgery

## 2023-12-02 ENCOUNTER — Other Ambulatory Visit: Payer: Self-pay

## 2023-12-03 ENCOUNTER — Ambulatory Visit: Payer: Medicare (Managed Care) | Admitting: Cardiology

## 2023-12-03 VITALS — BP 130/80 | HR 79 | Resp 16 | Ht 62.0 in | Wt 179.8 lb

## 2023-12-03 DIAGNOSIS — R002 Palpitations: Secondary | ICD-10-CM

## 2023-12-03 DIAGNOSIS — Z01810 Encounter for preprocedural cardiovascular examination: Secondary | ICD-10-CM

## 2023-12-03 DIAGNOSIS — Z789 Other specified health status: Secondary | ICD-10-CM

## 2023-12-03 NOTE — Progress Notes (Signed)
 CARDIOLOGY FOLLOWUP I had the pleasure of seeing [name] in followup for her palpitations and frequent PVCs.  She has been feeling well from a cardiac standpoint with only rare skipped heartbeats and brief flutters typically at nighttime.  She has had no sustained sense of her heart racing.  No syncope or presyncopal symptoms.  No chest pain or dyspnea.  She is planning on having knee surgery.Allergies, PMFS, & ROS were reviewed and updated. Pertinent details as above. MEDICATIONS: Current Outpatient Medications Medication Sig  celecoxib 200 mg capsule Take 1 capsule (200 mg total) by mouth 2 times daily. (Patient not taking: Reported on 09/16/2023)  traZODone 50 mg tablet Take 1 tablet (50 mg total) by mouth nightly. (Patient not taking: Reported on 09/16/2023)  metoprolol  tartrate (LOPRESSOR ) 25 mg tablet Take 0.5 tablets (12.5 mg total) by mouth 2 times daily.  omeprazole 20 mg capsule Take 1 capsule (20 mg total) by mouth as needed.  buPROPion (WELLBUTRIN SR) 200 mg 12 hr tablet Take 1 tablet (200 mg total) by mouth daily.  atorvastatin (LIPITOR) 20 mg tablet Take 1 tablet (20 mg total) by mouth daily.  levothyroxine  (SYNTHROID , LEVOTHROID) 112 mcg tablet Take 1 tablet (112 mcg total) by mouth daily.  fluticasone (FLONASE) 50 MCG/ACT nasal spray Spray 1 spray into nostril daily as needed for Rhinitis. No current facility-administered medications for this visit. PHYSICAL EXAM:BP 130/80 (BP Location: Left arm, Patient Position: Sitting, Cuff Size: adult)   Pulse 79   Resp 16   Ht 1.575 m (5' 2)   Wt 81.6 kg (179 lb 12.8 oz)   SpO2 97%   BMI 32.89 kg/m . GEN: Pleasant, well-appearing, no acute distress. HEENT: Sclerae clear, mucous membranes moist. LUNGS: clear to ausculation bilaterally, no wheezes or crackles. CV: Regular rate and rhythm, normal S1 and S2, no S3 or S4, no murmur, no rub. JVP not elevated. No carotid bruits.  Ext: no edema, intact pedal pulses  bilaterally.  LABS/DATA:   Lab results: 11/07/240825 Sodium 141 Potassium 4.3 Chloride 103 CO2 27 UN 21* Creatinine 0.88 Glucose 102* Calcium 9.3   Lab results: 11/07/240825 WBC 6.2 Hemoglobin 13.8 Hematocrit 43 RBC 4.3 Platelets 256   Lab results: 11/07/240825 Cholesterol 178 HDL 96* Triglycerides 64 LDL Calculated 69 No results for input(s): PROBNP in the last 8760 hours. ECHO COMPLETE 07/26/2023Interpretation SummaryLV chamber size with normal LVEF and no regional dysfunctionMild tricuspid regurgitation with estimated mild hypertension and normal RV size/functionIMPRESSION/PLAN:1.  Palpitations, PVCs.  Symptoms are mild at present.  EF is normal.  No anginal symptoms.  We will continue current medical therapy with metoprolol .2.  Dyslipidemia.  Last LDL excellent at 69.  Continue statin.3.  Preoperative cardiac evaluation.  The patient reports she would likely require knee surgery, possibly in the springtime.  She is having no concerning cardiac symptoms, there is no heart failure on exam, and despite her knee her functional status is good.  Therefore her cardiac risk is sufficiently low to proceed with surgery.  Assuming she remains minimally symptomatic from a cardiac standpoint she does not need to be seen back in our office prior to surgery.  She should continue metoprolol  perioperatively if at all possible.We will plan for follow-up in 2 years.

## 2023-12-04 ENCOUNTER — Other Ambulatory Visit: Payer: Self-pay | Admitting: Cardiology

## 2023-12-05 LAB — EKG 12-LEAD
P: 66 deg
PR: 157 ms
QRS: 25 deg
QRSD: 88 ms
QT: 390 ms
QTc: 443 ms
Rate: 78 {beats}/min
T: 56 deg

## 2023-12-05 MED ORDER — METOPROLOL TARTRATE 25 MG PO TABS *I*
12.5000 mg | ORAL_TABLET | Freq: Two times a day (BID) | ORAL | 3 refills | Status: AC
Start: 2023-12-05 — End: ?

## 2023-12-16 ENCOUNTER — Encounter: Payer: Self-pay | Admitting: Family Medicine

## 2023-12-18 ENCOUNTER — Other Ambulatory Visit: Payer: Self-pay

## 2023-12-18 ENCOUNTER — Ambulatory Visit: Payer: Medicare (Managed Care) | Attending: Family Medicine | Admitting: Family Medicine

## 2023-12-18 ENCOUNTER — Encounter: Payer: Self-pay | Admitting: Family Medicine

## 2023-12-18 VITALS — BP 124/76 | HR 90 | Temp 97.6°F | Ht 62.0 in | Wt 181.6 lb

## 2023-12-18 DIAGNOSIS — R7303 Prediabetes: Secondary | ICD-10-CM | POA: Insufficient documentation

## 2023-12-18 DIAGNOSIS — Z23 Encounter for immunization: Secondary | ICD-10-CM | POA: Insufficient documentation

## 2023-12-18 DIAGNOSIS — F32A Depression, unspecified: Secondary | ICD-10-CM | POA: Insufficient documentation

## 2023-12-18 DIAGNOSIS — Z853 Personal history of malignant neoplasm of breast: Secondary | ICD-10-CM | POA: Insufficient documentation

## 2023-12-18 DIAGNOSIS — R002 Palpitations: Secondary | ICD-10-CM | POA: Insufficient documentation

## 2023-12-18 DIAGNOSIS — F419 Anxiety disorder, unspecified: Secondary | ICD-10-CM | POA: Insufficient documentation

## 2023-12-18 DIAGNOSIS — E785 Hyperlipidemia, unspecified: Secondary | ICD-10-CM | POA: Insufficient documentation

## 2023-12-18 DIAGNOSIS — E039 Hypothyroidism, unspecified: Secondary | ICD-10-CM | POA: Insufficient documentation

## 2023-12-18 DIAGNOSIS — M1712 Unilateral primary osteoarthritis, left knee: Secondary | ICD-10-CM | POA: Insufficient documentation

## 2023-12-18 MED ORDER — ATORVASTATIN CALCIUM 20 MG PO TABS *I*
20.0000 mg | ORAL_TABLET | Freq: Every day | ORAL | 3 refills | Status: AC
Start: 2023-12-18 — End: ?

## 2023-12-18 MED ORDER — FLUTICASONE PROPIONATE 50 MCG/ACT NA SUSP *I*
2.0000 | Freq: Every day | NASAL | 3 refills | Status: AC | PRN
Start: 2023-12-18 — End: ?

## 2023-12-18 NOTE — Patient Instructions (Signed)
 Get labs done in the next month. Consider covid vaccine.

## 2023-12-18 NOTE — Progress Notes (Signed)
 [facility] Subjective [name] is a 73 y.o. female who presents for New Patient Visit (Patient presents for a new patient visit. Patient has no concerns to discuss today. )History of Present IllnessThe patient is a female who presents to establish care.She was previously under the care of [facility] but has since transitioned to our practice due to a preference for female physicians. She has been with [facility] for 3 yearsPalpitationsShe continues to experience palpitations, which are managed with medication. They have improved with medication. - Onset: Referred to cardiology for palpitations during her time with [facility].- Duration: Ongoing.- Character: Managed with medication.Arthritis in Both KneesShe has arthritis in both knees and is scheduled for a knee replacement in April, with the second knee to be replaced 6 months later. She is considering an earlier surgery date but has decided to postpone due to an upcoming cruise in December. She is scheduled for another injection in the third week of November.- Onset: Not specified.- Location: Both knees.- Duration: Ongoing.- Character: Arthritis.- Alleviating/Aggravating Factors: Scheduled for knee replacement surgery and injections.- Timing: Scheduled for knee replacement in April and another injection in the third week of November.Anxiety She has a history of anxiety and depression . Has tried several medications. Wellbutrin seems to be working okay. Currently is only taking the 200mg  SR once daily. Was on twice daily but she backed off. Has tried several other meds. Son has mental health problems that are not being addressed so this brings her great deal of stress. Sleep IssuesShe reports difficulty sleeping and uses melatonin as needed.- Onset: Not specified.- Duration: Ongoing.- Character: Difficulty  sleeping.- Alleviating/Aggravating Factors: Uses melatonin as needed.Diet: Attempting dietary changesAlcohol: Consumes approximately 7 glasses of wine per weekSleep: Reports difficulty sleeping, uses melatonin as neededPAST SURGICAL HISTORY:- Tonsillectomy- Mastectomy with implant- Meniscus surgeryFAMILY HISTORYThe patient's mother had Alzheimer's and high blood pressure. Her father had lung cancer. Her sister had breast cancer, and another sister has thyroid  disease. Objective Blood pressure 124/76, pulse 90, temperature 36.4 C (97.6 F), temperature source Temporal, height 1.575 m (5' 2), weight 82.4 kg (181 lb 9.6 oz), SpO2 98%.Physical ExamNeck: No abnormalities noted on palpation.Respiratory: Clear to auscultation, no wheezing, rales or rhonchi.Cardiovascular: Regular rate and rhythm, no murmurs, rubs, or gallops.ResultsLabs - A1c: 08/2023, 5.9 - Thyroid  level: 08/2023, Normal - CBC: 12/2022, Normal - Kidney function: 12/2022, Normal - Liver function: 12/2022, Normal - LDL cholesterol: 12/2022, Normal  Assessment & Plan1. Establishment of care:- Blood pressure readings are within the normal range.- A1c level has improved from 6.1 to 5.9.- Thyroid  function tests conducted in July 2025 showed satisfactory results.- Complete blood count (CBC) performed in November 2024 was within normal limits, indicating no anemia or infection.- Renal and hepatic functions are also within normal parameters.- Low-density lipoprotein (LDL) levels are well-controlled, a significant improvement from the high levels recorded three years ago prior to the initiation of cholesterol-lowering medication.- Vitamin D  levels are also within the normal range.- Advised to receive the influenza vaccine today and consider getting the COVID-19 booster in a few weeks at her local pharmacy or during her next visit.- Advised to take melatonin as needed for sleep  and to use Benadryl  with Tylenol  or Aleve  occasionally if her knees are causing discomfort.- A 90-day refill of her cholesterol medication will be provided.- Laboratory tests will be ordered to monitor her thyroid  function and cholesterol levels in December 2025.2.) hyperlipidemia-continue statin. Check labs in 1 month. 3.) hypothyroidism-continue the  levothyroxine . Check tsh in 1 month. 4.) depression/anxiety-continue the wellbutrin for now. 5.) knee pain-will be having knee replacement in 4/20266.) palpitations-controlled on metoprolol . 7.) prediabetes-A1c was stable in 7/25Flu vaccine given today. Follow-up: The patient will follow up in January 2026. Author: Shardee Dieu L Kamerin Axford, NP  Note signed: 12/18/2023

## 2024-01-08 ENCOUNTER — Other Ambulatory Visit: Payer: Self-pay

## 2024-01-08 ENCOUNTER — Encounter: Payer: Self-pay | Admitting: Orthopedic Surgery

## 2024-01-08 ENCOUNTER — Ambulatory Visit: Payer: Medicare (Managed Care) | Admitting: Orthopedic Surgery

## 2024-01-08 VITALS — BP 132/66 | HR 72 | Ht 59.0 in | Wt 178.0 lb

## 2024-01-08 DIAGNOSIS — M1712 Unilateral primary osteoarthritis, left knee: Secondary | ICD-10-CM

## 2024-01-08 NOTE — Progress Notes (Signed)
 This is a confidential patient report written  for the express purpose of professional communication.Subjective: [name] presents with severe global left knee pain, worse with activity, now impacting activities of daily living quality of life, has failed prior conservative care including oral medication, activity modification, injection. ROS: No chest pain, SOB, fever, chills, skin erythema, all remaining systems negative.Objective:Constitutional: NAD, comfortablePsychiatric: Normal affectMusculoskeletal:Left knee: Ambulating heel - toe with a non-antalgic gait, soft tissue envelope intact, no appreciable effusion, ROM 5-125, mild crepitus, no coronal or sagittal plane instability, neurocirculatroy exam within normal limits.Radiographs: multiple views demonstrating severe bilateral knee arthrosis with bone on bone contact. Impression:1.  Left knee OA - severe2. GERD, HLD, HypothyroPlan:Treatment options limited to ongoing conservative care and knee reconstruction.   We also discussed knee reconstruction including the rationale for surgery, the technical aspects of the surgery, the anticipated postoperative course, as well as the risks related to total knee arthroplasty. These risks include but are not limited to infection requiring IV antibiotics, further surgery, and in extreme cases loss of limb. We discussed nerve injury resulting in permanent leg weakness/foot drop, and gait impairment, blood vessel injury requiring further surgery for limb salvage, and in extreme cases loss of limb, bleeding requiring transfusion, iatrogenic femur and/or tibia fracture, knee instability, incomplete relief of symptoms, wound drainage, need for further surgery, post operative stiffness requiring manipulation under anesthesia, reflex sympathetic dystrophy, risks of anesthesia, risk of blood clots in the legs and/or lungs, post-op ileus, risk of exacerbation of underlying medical issues  including heart attack, stroke, respiratory failure, kidney failure, liver failure, and even death. The patient understood these risks and may proceed with surgery on an elective basis.  Offered and received temporizing injections.

## 2024-01-30 ENCOUNTER — Encounter: Payer: Self-pay | Admitting: Family Medicine

## 2024-01-30 ENCOUNTER — Ambulatory Visit: Payer: Medicare (Managed Care) | Admitting: Family Medicine

## 2024-01-30 NOTE — Patient Instructions (Incomplete)
 Over the counter treatment for sinusitis/uri includes:  - Decongestants such as pseudoephedrine (Sudafed) to help relieve congestion. Potential side effects include increased Heart Rate or Insomnia   - Tylenol or Ibuprofen for pain such as headaches, facial pain or fever.   For PND:   - Guaifenesin such as Mucinex to help thin mucous   - NeilMed Sinus rinse twice a day followed by flonase nasal spray 2 sprays/nostril twice a day.  - Take antihistamine: Claritin (or generic loratadine) 1 pill twice daily (fine eventhough bottle says once a day) - for 2 weeks    Call if symptoms worsen or new ones develop.  Go to the Emergency Room if you develop shortness of breath, difficulty breathing with exertion, or chest pain.

## 2024-01-30 NOTE — Progress Notes (Unsigned)
 [facility] Subjective [name] is a 73 y.o. female who presents for No chief complaint on file.History of Present Illness Objective There were no vitals taken for this visit.Physical ExamResults  Assessment & Plan{Time Based Attestation and Data Reviewed (Optional):[id number]} Author: Corean FORBES Deaner, NP  Note signed: 01/30/2024

## 2024-04-23 ENCOUNTER — Ambulatory Visit: Payer: Medicare (Managed Care) | Admitting: Family Medicine

## 2024-06-04 ENCOUNTER — Inpatient Hospital Stay: Admit: 2024-06-04 | Payer: Self-pay | Admitting: Orthopedic Surgery

## 2024-06-04 DIAGNOSIS — M1712 Unilateral primary osteoarthritis, left knee: Secondary | ICD-10-CM | POA: Insufficient documentation

## 2024-06-04 SURGERY — ARTHROPLASTY, KNEE, TOTAL
Anesthesia: General | Site: Knee | Laterality: Left

## 2024-06-19 ENCOUNTER — Encounter: Payer: Medicare (Managed Care) | Admitting: Orthopedic Surgery

## 2024-07-28 ENCOUNTER — Ambulatory Visit: Payer: Medicare (Managed Care) | Admitting: Oncology
